# Patient Record
Sex: Female | Born: 1980
Health system: Southern US, Community
[De-identification: ages and names within clinical notes are randomized; demographics above are authoritative.]

---

## 2011-03-31 ENCOUNTER — Other Ambulatory Visit: Payer: Self-pay | Admitting: Nurse Practitioner

## 2011-03-31 ENCOUNTER — Other Ambulatory Visit (HOSPITAL_COMMUNITY)
Admission: RE | Admit: 2011-03-31 | Discharge: 2011-03-31 | Disposition: A | Discharge status: Home / Self Care | Payer: Self-pay | Source: Ambulatory Visit | Attending: Family Medicine | Admitting: Family Medicine

## 2011-03-31 DIAGNOSIS — R87610 Atypical squamous cells of undetermined significance on cytologic smear of cervix (ASC-US): Secondary | ICD-10-CM | POA: Insufficient documentation

## 2011-04-01 ENCOUNTER — Other Ambulatory Visit (HOSPITAL_COMMUNITY)
Admission: RE | Admit: 2011-04-01 | Discharge: 2011-04-01 | Disposition: A | Discharge status: Home / Self Care | Payer: Self-pay | Source: Ambulatory Visit | Attending: Nurse Practitioner | Admitting: Nurse Practitioner

## 2011-04-01 DIAGNOSIS — N870 Mild cervical dysplasia: Secondary | ICD-10-CM | POA: Insufficient documentation

## 2018-09-21 DIAGNOSIS — I639 Cerebral infarction, unspecified: Secondary | ICD-10-CM

## 2018-09-21 HISTORY — DX: Cerebral infarction, unspecified: I63.9

## 2019-08-25 ENCOUNTER — Inpatient Hospital Stay (HOSPITAL_COMMUNITY): Payer: 59 | Admitting: Anesthesiology

## 2019-08-25 ENCOUNTER — Other Ambulatory Visit: Payer: Self-pay

## 2019-08-25 ENCOUNTER — Emergency Department (HOSPITAL_COMMUNITY): Payer: 59

## 2019-08-25 ENCOUNTER — Inpatient Hospital Stay (HOSPITAL_COMMUNITY)
Admission: EM | Disposition: A | Discharge status: Home Health | Payer: Self-pay | Source: Home / Self Care | Attending: Neurology

## 2019-08-25 ENCOUNTER — Inpatient Hospital Stay (HOSPITAL_COMMUNITY): Payer: 59

## 2019-08-25 ENCOUNTER — Inpatient Hospital Stay (HOSPITAL_COMMUNITY)
Admission: EM | Admit: 2019-08-25 | Discharge: 2019-09-05 | DRG: 023 | Disposition: A | Discharge status: Home Health | Payer: 59 | Attending: Neurology | Admitting: Neurology

## 2019-08-25 ENCOUNTER — Encounter (HOSPITAL_COMMUNITY): Payer: Self-pay | Admitting: Certified Registered"

## 2019-08-25 DIAGNOSIS — E669 Obesity, unspecified: Secondary | ICD-10-CM | POA: Diagnosis present

## 2019-08-25 DIAGNOSIS — R29727 NIHSS score 27: Secondary | ICD-10-CM | POA: Diagnosis present

## 2019-08-25 DIAGNOSIS — E1165 Type 2 diabetes mellitus with hyperglycemia: Secondary | ICD-10-CM | POA: Diagnosis present

## 2019-08-25 DIAGNOSIS — I607 Nontraumatic subarachnoid hemorrhage from unspecified intracranial artery: Secondary | ICD-10-CM

## 2019-08-25 DIAGNOSIS — I10 Essential (primary) hypertension: Secondary | ICD-10-CM | POA: Diagnosis present

## 2019-08-25 DIAGNOSIS — R402422 Glasgow coma scale score 9-12, at arrival to emergency department: Secondary | ICD-10-CM | POA: Diagnosis present

## 2019-08-25 DIAGNOSIS — G935 Compression of brain: Secondary | ICD-10-CM | POA: Diagnosis present

## 2019-08-25 DIAGNOSIS — I609 Nontraumatic subarachnoid hemorrhage, unspecified: Secondary | ICD-10-CM | POA: Diagnosis present

## 2019-08-25 DIAGNOSIS — I639 Cerebral infarction, unspecified: Secondary | ICD-10-CM | POA: Diagnosis not present

## 2019-08-25 DIAGNOSIS — I619 Nontraumatic intracerebral hemorrhage, unspecified: Secondary | ICD-10-CM | POA: Diagnosis present

## 2019-08-25 DIAGNOSIS — S06361D Traumatic hemorrhage of cerebrum, unspecified, with loss of consciousness of 30 minutes or less, subsequent encounter: Secondary | ICD-10-CM

## 2019-08-25 DIAGNOSIS — Z20828 Contact with and (suspected) exposure to other viral communicable diseases: Secondary | ICD-10-CM | POA: Diagnosis present

## 2019-08-25 DIAGNOSIS — G911 Obstructive hydrocephalus: Secondary | ICD-10-CM | POA: Diagnosis present

## 2019-08-25 DIAGNOSIS — Z6829 Body mass index (BMI) 29.0-29.9, adult: Secondary | ICD-10-CM | POA: Diagnosis not present

## 2019-08-25 DIAGNOSIS — R001 Bradycardia, unspecified: Secondary | ICD-10-CM | POA: Diagnosis not present

## 2019-08-25 DIAGNOSIS — I493 Ventricular premature depolarization: Secondary | ICD-10-CM | POA: Diagnosis present

## 2019-08-25 DIAGNOSIS — I615 Nontraumatic intracerebral hemorrhage, intraventricular: Secondary | ICD-10-CM

## 2019-08-25 DIAGNOSIS — I676 Nonpyogenic thrombosis of intracranial venous system: Secondary | ICD-10-CM | POA: Diagnosis present

## 2019-08-25 DIAGNOSIS — G8191 Hemiplegia, unspecified affecting right dominant side: Secondary | ICD-10-CM | POA: Diagnosis present

## 2019-08-25 DIAGNOSIS — F1290 Cannabis use, unspecified, uncomplicated: Secondary | ICD-10-CM | POA: Diagnosis present

## 2019-08-25 DIAGNOSIS — I6389 Other cerebral infarction: Secondary | ICD-10-CM | POA: Diagnosis not present

## 2019-08-25 DIAGNOSIS — I616 Nontraumatic intracerebral hemorrhage, multiple localized: Secondary | ICD-10-CM

## 2019-08-25 DIAGNOSIS — G08 Intracranial and intraspinal phlebitis and thrombophlebitis: Secondary | ICD-10-CM | POA: Diagnosis not present

## 2019-08-25 DIAGNOSIS — D696 Thrombocytopenia, unspecified: Secondary | ICD-10-CM | POA: Diagnosis present

## 2019-08-25 DIAGNOSIS — J9601 Acute respiratory failure with hypoxia: Secondary | ICD-10-CM | POA: Diagnosis present

## 2019-08-25 DIAGNOSIS — E876 Hypokalemia: Secondary | ICD-10-CM | POA: Diagnosis present

## 2019-08-25 DIAGNOSIS — E785 Hyperlipidemia, unspecified: Secondary | ICD-10-CM | POA: Diagnosis present

## 2019-08-25 DIAGNOSIS — S06361S Traumatic hemorrhage of cerebrum, unspecified, with loss of consciousness of 30 minutes or less, sequela: Secondary | ICD-10-CM

## 2019-08-25 DIAGNOSIS — R4701 Aphasia: Secondary | ICD-10-CM | POA: Diagnosis present

## 2019-08-25 DIAGNOSIS — I611 Nontraumatic intracerebral hemorrhage in hemisphere, cortical: Secondary | ICD-10-CM | POA: Diagnosis not present

## 2019-08-25 DIAGNOSIS — R1310 Dysphagia, unspecified: Secondary | ICD-10-CM | POA: Diagnosis present

## 2019-08-25 DIAGNOSIS — J9602 Acute respiratory failure with hypercapnia: Secondary | ICD-10-CM | POA: Diagnosis present

## 2019-08-25 DIAGNOSIS — G936 Cerebral edema: Secondary | ICD-10-CM | POA: Diagnosis present

## 2019-08-25 HISTORY — PX: CRANIOTOMY: SHX93

## 2019-08-25 HISTORY — PX: VENTRICULOSTOMY: SHX5377

## 2019-08-25 LAB — CBC WITH DIFFERENTIAL/PLATELET
Abs Immature Granulocytes: 0.02 10*3/uL (ref 0.00–0.07)
Basophils Absolute: 0 10*3/uL (ref 0.0–0.1)
Basophils Relative: 0 %
Eosinophils Absolute: 0.3 10*3/uL (ref 0.0–0.5)
Eosinophils Relative: 3 %
HCT: 38.2 % (ref 36.0–46.0)
Hemoglobin: 12 g/dL (ref 12.0–15.0)
Immature Granulocytes: 0 %
Lymphocytes Relative: 34 %
Lymphs Abs: 3.3 10*3/uL (ref 0.7–4.0)
MCH: 29.6 pg (ref 26.0–34.0)
MCHC: 31.4 g/dL (ref 30.0–36.0)
MCV: 94.1 fL (ref 80.0–100.0)
Monocytes Absolute: 0.6 10*3/uL (ref 0.1–1.0)
Monocytes Relative: 6 %
Neutro Abs: 5.4 10*3/uL (ref 1.7–7.7)
Neutrophils Relative %: 57 %
Platelets: 217 10*3/uL (ref 150–400)
RBC: 4.06 MIL/uL (ref 3.87–5.11)
RDW: 13.2 % (ref 11.5–15.5)
WBC: 9.6 10*3/uL (ref 4.0–10.5)
nRBC: 0 % (ref 0.0–0.2)

## 2019-08-25 LAB — URINALYSIS, ROUTINE W REFLEX MICROSCOPIC
Bilirubin Urine: NEGATIVE
Glucose, UA: NEGATIVE mg/dL
Hgb urine dipstick: NEGATIVE
Ketones, ur: 5 mg/dL — AB
Leukocytes,Ua: NEGATIVE
Nitrite: NEGATIVE
Protein, ur: 30 mg/dL — AB
Specific Gravity, Urine: 1.024 (ref 1.005–1.030)
pH: 5 (ref 5.0–8.0)

## 2019-08-25 LAB — ACETAMINOPHEN LEVEL: Acetaminophen (Tylenol), Serum: <10 ug/mL — ABNORMAL LOW (ref 10–30)

## 2019-08-25 LAB — COMPREHENSIVE METABOLIC PANEL
ALT: 19 U/L (ref 0–44)
AST: 28 U/L (ref 15–41)
Albumin: 3.8 g/dL (ref 3.5–5.0)
Alkaline Phosphatase: 61 U/L (ref 38–126)
Anion gap: 12 (ref 5–15)
BUN: 11 mg/dL (ref 6–20)
CO2: 21 mmol/L — ABNORMAL LOW (ref 22–32)
Calcium: 8.7 mg/dL — ABNORMAL LOW (ref 8.9–10.3)
Chloride: 107 mmol/L (ref 98–111)
Creatinine, Ser: 0.8 mg/dL (ref 0.44–1.00)
GFR calc Af Amer: >60 mL/min (ref >60)
GFR calc non Af Amer: >60 mL/min (ref >60)
Glucose, Bld: 146 mg/dL — ABNORMAL HIGH (ref 70–99)
Potassium: 3.1 mmol/L — ABNORMAL LOW (ref 3.5–5.1)
Sodium: 140 mmol/L (ref 135–145)
Total Bilirubin: 0.8 mg/dL (ref 0.3–1.2)
Total Protein: 6.5 g/dL (ref 6.5–8.1)

## 2019-08-25 LAB — TRIGLYCERIDES: Triglycerides: 153 mg/dL — ABNORMAL HIGH (ref <150)

## 2019-08-25 LAB — LIPID PANEL
Cholesterol: 211 mg/dL — ABNORMAL HIGH (ref 0–200)
HDL: 56 mg/dL (ref >40)
LDL Cholesterol: 124 mg/dL — ABNORMAL HIGH (ref 0–99)
Total CHOL/HDL Ratio: 3.8 RATIO
Triglycerides: 153 mg/dL — ABNORMAL HIGH (ref <150)
VLDL: 31 mg/dL (ref 0–40)

## 2019-08-25 LAB — TYPE AND SCREEN
ABO/RH(D): A POS
Antibody Screen: NEGATIVE

## 2019-08-25 LAB — RAPID URINE DRUG SCREEN, HOSP PERFORMED
Amphetamines: NOT DETECTED
Barbiturates: NOT DETECTED
Benzodiazepines: NOT DETECTED
Cocaine: NOT DETECTED
Opiates: NOT DETECTED
Tetrahydrocannabinol: POSITIVE — AB

## 2019-08-25 LAB — CK: Total CK: 102 U/L (ref 38–234)

## 2019-08-25 LAB — ABO/RH: ABO/RH(D): A POS

## 2019-08-25 LAB — MRSA PCR SCREENING: MRSA by PCR: NEGATIVE

## 2019-08-25 LAB — I-STAT BETA HCG BLOOD, ED (MC, WL, AP ONLY): I-stat hCG, quantitative: <5 m[IU]/mL (ref <5)

## 2019-08-25 LAB — HEMOGLOBIN A1C
Hgb A1c MFr Bld: 4.7 % — ABNORMAL LOW (ref 4.8–5.6)
Mean Plasma Glucose: 88.19 mg/dL

## 2019-08-25 LAB — HIV ANTIBODY (ROUTINE TESTING W REFLEX): HIV Screen 4th Generation wRfx: NONREACTIVE

## 2019-08-25 LAB — SARS CORONAVIRUS 2 BY RT PCR (HOSPITAL ORDER, PERFORMED IN ~~LOC~~ HOSPITAL LAB): SARS Coronavirus 2: NEGATIVE

## 2019-08-25 LAB — ETHANOL: Alcohol, Ethyl (B): <10 mg/dL (ref <10)

## 2019-08-25 LAB — AMMONIA: Ammonia: 43 umol/L — ABNORMAL HIGH (ref 9–35)

## 2019-08-25 LAB — SALICYLATE LEVEL: Salicylate Lvl: <7 mg/dL (ref 2.8–30.0)

## 2019-08-25 SURGERY — CRANIOTOMY HEMATOMA EVACUATION SUBDURAL
Anesthesia: General | Laterality: Right

## 2019-08-25 MED ORDER — PROPOFOL 10 MG/ML IV BOLUS
INTRAVENOUS | Status: AC
  Filled 2019-08-25: qty 20

## 2019-08-25 MED ORDER — ONDANSETRON HCL 4 MG/2ML IJ SOLN
INTRAMUSCULAR | Status: AC
  Filled 2019-08-25: qty 2

## 2019-08-25 MED ORDER — PANTOPRAZOLE SODIUM 40 MG IV SOLR: 40.0000 mg | Freq: Every day | INTRAVENOUS | Status: DC

## 2019-08-25 MED ORDER — FENTANYL CITRATE (PF) 250 MCG/5ML IJ SOLN
INTRAMUSCULAR | Status: AC
  Filled 2019-08-25: qty 5

## 2019-08-25 MED ORDER — PROMETHAZINE HCL 25 MG PO TABS: 12.5000 mg | ORAL_TABLET | ORAL | Status: DC | PRN

## 2019-08-25 MED ORDER — ONDANSETRON HCL 4 MG/2ML IJ SOLN
INTRAMUSCULAR | Status: DC | PRN
  Administered 2019-08-25: 4 mg via INTRAVENOUS

## 2019-08-25 MED ORDER — LIDOCAINE-EPINEPHRINE 0.5 %-1:200000 IJ SOLN
INTRAMUSCULAR | Status: DC | PRN
  Administered 2019-08-25: 6 mL
  Administered 2019-08-25: 10 mL

## 2019-08-25 MED ORDER — CLEVIDIPINE BUTYRATE 0.5 MG/ML IV EMUL
0.0000 mg/h | INTRAVENOUS | Status: DC
  Administered 2019-08-25: 14 mg/h via INTRAVENOUS
  Administered 2019-08-25: 2 mg/h via INTRAVENOUS
  Administered 2019-08-25: 13:00:00 10 mg/h via INTRAVENOUS
  Filled 2019-08-25 (×3): qty 50

## 2019-08-25 MED ORDER — THROMBIN 20000 UNITS EX SOLR
CUTANEOUS | Status: DC | PRN
  Administered 2019-08-25: 09:00:00 via TOPICAL

## 2019-08-25 MED ORDER — STROKE: EARLY STAGES OF RECOVERY BOOK
Freq: Once | Status: DC
  Filled 2019-08-25: qty 1

## 2019-08-25 MED ORDER — ACETAMINOPHEN 325 MG PO TABS
650.0000 mg | ORAL_TABLET | ORAL | Status: DC | PRN
  Administered 2019-08-29 – 2019-09-02 (×4): 650 mg via ORAL
  Filled 2019-08-25 (×4): qty 2

## 2019-08-25 MED ORDER — HYDROCODONE-ACETAMINOPHEN 5-325 MG PO TABS
1.0000 | ORAL_TABLET | ORAL | Status: DC | PRN
  Administered 2019-08-28 – 2019-09-04 (×20): 1 via ORAL
  Filled 2019-08-25 (×22): qty 1

## 2019-08-25 MED ORDER — CHLORHEXIDINE GLUCONATE CLOTH 2 % EX PADS
6.0000 | MEDICATED_PAD | Freq: Every day | CUTANEOUS | Status: DC
  Administered 2019-08-25 – 2019-09-05 (×9): 6 via TOPICAL

## 2019-08-25 MED ORDER — ROCURONIUM BROMIDE 10 MG/ML (PF) SYRINGE
PREFILLED_SYRINGE | INTRAVENOUS | Status: AC
  Filled 2019-08-25: qty 10

## 2019-08-25 MED ORDER — ONDANSETRON HCL 4 MG/2ML IJ SOLN
4.0000 mg | Freq: Once | INTRAMUSCULAR | Status: DC
  Filled 2019-08-25: qty 2

## 2019-08-25 MED ORDER — ACETAMINOPHEN 650 MG RE SUPP
650.0000 mg | RECTAL | Status: DC | PRN
  Filled 2019-08-25: qty 1

## 2019-08-25 MED ORDER — ONDANSETRON HCL 4 MG PO TABS: 4.0000 mg | ORAL_TABLET | ORAL | Status: DC | PRN

## 2019-08-25 MED ORDER — THROMBIN 5000 UNITS EX SOLR
CUTANEOUS | Status: AC
  Filled 2019-08-25: qty 5000

## 2019-08-25 MED ORDER — FENTANYL CITRATE (PF) 100 MCG/2ML IJ SOLN
INTRAMUSCULAR | Status: DC | PRN
  Administered 2019-08-25 (×2): 150 ug via INTRAVENOUS
  Administered 2019-08-25 (×2): 100 ug via INTRAVENOUS

## 2019-08-25 MED ORDER — MANNITOL 25 % IV SOLN
INTRAVENOUS | Status: DC | PRN
  Administered 2019-08-25 (×2): 12.5 g via INTRAVENOUS

## 2019-08-25 MED ORDER — NALOXONE HCL 0.4 MG/ML IJ SOLN: 0.0800 mg | INTRAMUSCULAR | Status: DC | PRN

## 2019-08-25 MED ORDER — PHENYLEPHRINE 40 MCG/ML (10ML) SYRINGE FOR IV PUSH (FOR BLOOD PRESSURE SUPPORT)
PREFILLED_SYRINGE | INTRAVENOUS | Status: DC | PRN
  Administered 2019-08-25: 160 ug via INTRAVENOUS
  Administered 2019-08-25: 80 ug via INTRAVENOUS

## 2019-08-25 MED ORDER — THROMBIN 5000 UNITS EX SOLR
OROMUCOSAL | Status: DC | PRN
  Administered 2019-08-25 (×2): 5 mL via TOPICAL

## 2019-08-25 MED ORDER — ACETAMINOPHEN 160 MG/5ML PO SOLN: 650.0000 mg | ORAL | Status: DC | PRN

## 2019-08-25 MED ORDER — LIDOCAINE-EPINEPHRINE 0.5 %-1:200000 IJ SOLN
INTRAMUSCULAR | Status: AC
  Filled 2019-08-25: qty 1

## 2019-08-25 MED ORDER — PROPOFOL 1000 MG/100ML IV EMUL
5.0000 ug/kg/min | INTRAVENOUS | Status: DC
  Administered 2019-08-25: 11:00:00 5 ug/kg/min via INTRAVENOUS
  Administered 2019-08-25: 25 ug/kg/min via INTRAVENOUS

## 2019-08-25 MED ORDER — CEFAZOLIN SODIUM-DEXTROSE 2-3 GM-%(50ML) IV SOLR
INTRAVENOUS | Status: DC | PRN
  Administered 2019-08-25: 2 g via INTRAVENOUS

## 2019-08-25 MED ORDER — SODIUM CHLORIDE 0.9 % IV SOLN
INTRAVENOUS | Status: DC | PRN
  Administered 2019-08-25: 08:00:00 via INTRAVENOUS

## 2019-08-25 MED ORDER — PROPOFOL 1000 MG/100ML IV EMUL
INTRAVENOUS | Status: AC
  Administered 2019-08-25: 07:00:00 20 ug
  Filled 2019-08-25: qty 100

## 2019-08-25 MED ORDER — THROMBIN 20000 UNITS EX SOLR
CUTANEOUS | Status: AC
  Filled 2019-08-25: qty 20000

## 2019-08-25 MED ORDER — LABETALOL HCL 5 MG/ML IV SOLN: 10.0000 mg | INTRAVENOUS | Status: DC | PRN

## 2019-08-25 MED ORDER — SODIUM CHLORIDE 0.9% IV SOLUTION: Freq: Once | INTRAVENOUS | Status: DC

## 2019-08-25 MED ORDER — LEVETIRACETAM IN NACL 500 MG/100ML IV SOLN
500.0000 mg | Freq: Two times a day (BID) | INTRAVENOUS | Status: DC
  Administered 2019-08-25 – 2019-09-01 (×15): 500 mg via INTRAVENOUS
  Filled 2019-08-25 (×15): qty 100

## 2019-08-25 MED ORDER — ETOMIDATE 2 MG/ML IV SOLN
INTRAVENOUS | Status: AC | PRN
  Administered 2019-08-25: 20 mg via INTRAVENOUS

## 2019-08-25 MED ORDER — PHENYLEPHRINE 40 MCG/ML (10ML) SYRINGE FOR IV PUSH (FOR BLOOD PRESSURE SUPPORT)
PREFILLED_SYRINGE | INTRAVENOUS | Status: AC
  Filled 2019-08-25: qty 10

## 2019-08-25 MED ORDER — ROCURONIUM BROMIDE 50 MG/5ML IV SOLN
INTRAVENOUS | Status: AC | PRN
  Administered 2019-08-25: 80 mg via INTRAVENOUS

## 2019-08-25 MED ORDER — ROCURONIUM BROMIDE 50 MG/5ML IV SOSY
PREFILLED_SYRINGE | INTRAVENOUS | Status: DC | PRN
  Administered 2019-08-25: 20 mg via INTRAVENOUS
  Administered 2019-08-25: 50 mg via INTRAVENOUS
  Administered 2019-08-25: 20 mg via INTRAVENOUS

## 2019-08-25 MED ORDER — SENNOSIDES-DOCUSATE SODIUM 8.6-50 MG PO TABS: 1.0000 | ORAL_TABLET | Freq: Two times a day (BID) | ORAL | Status: DC

## 2019-08-25 MED ORDER — PROPOFOL 10 MG/ML IV BOLUS
INTRAVENOUS | Status: DC | PRN
  Administered 2019-08-25: 80 mg via INTRAVENOUS

## 2019-08-25 MED ORDER — DEXAMETHASONE SODIUM PHOSPHATE 10 MG/ML IJ SOLN
INTRAMUSCULAR | Status: DC | PRN
  Administered 2019-08-25: 10 mg via INTRAVENOUS

## 2019-08-25 MED ORDER — 0.9 % SODIUM CHLORIDE (POUR BTL) OPTIME
TOPICAL | Status: DC | PRN
  Administered 2019-08-25 (×3): 1000 mL

## 2019-08-25 MED ORDER — SODIUM CHLORIDE 0.9 % IV SOLN
INTRAVENOUS | Status: DC | PRN
  Administered 2019-08-25: 200 mL via INTRAVENOUS

## 2019-08-25 MED ORDER — BACITRACIN ZINC 500 UNIT/GM EX OINT
TOPICAL_OINTMENT | CUTANEOUS | Status: AC
  Filled 2019-08-25: qty 28.35

## 2019-08-25 MED ORDER — MORPHINE SULFATE (PF) 2 MG/ML IV SOLN
1.0000 mg | INTRAVENOUS | Status: DC | PRN
  Administered 2019-08-25 – 2019-08-26 (×3): 2 mg via INTRAVENOUS
  Administered 2019-08-27 – 2019-08-31 (×3): 1 mg via INTRAVENOUS
  Filled 2019-08-25 (×6): qty 1

## 2019-08-25 MED ORDER — BACITRACIN ZINC 500 UNIT/GM EX OINT
TOPICAL_OINTMENT | CUTANEOUS | Status: DC | PRN
  Administered 2019-08-25: 1 via TOPICAL

## 2019-08-25 MED ORDER — ONDANSETRON HCL 4 MG/2ML IJ SOLN
4.0000 mg | INTRAMUSCULAR | Status: DC | PRN
  Administered 2019-08-25 – 2019-08-31 (×6): 4 mg via INTRAVENOUS
  Filled 2019-08-25 (×5): qty 2

## 2019-08-25 MED ORDER — HEMOSTATIC AGENTS (NO CHARGE) OPTIME
TOPICAL | Status: DC | PRN
  Administered 2019-08-25: 1 via TOPICAL

## 2019-08-25 MED ORDER — IOHEXOL 350 MG/ML SOLN
75.0000 mL | Freq: Once | INTRAVENOUS | Status: AC | PRN
  Administered 2019-08-25: 08:00:00 75 mL via INTRAVENOUS

## 2019-08-25 MED ORDER — CEFAZOLIN SODIUM-DEXTROSE 1-4 GM/50ML-% IV SOLN
1.0000 g | Freq: Three times a day (TID) | INTRAVENOUS | Status: DC
  Administered 2019-08-25 – 2019-09-01 (×22): 1 g via INTRAVENOUS
  Filled 2019-08-25 (×24): qty 50

## 2019-08-25 MED ORDER — DEXAMETHASONE SODIUM PHOSPHATE 10 MG/ML IJ SOLN
INTRAMUSCULAR | Status: AC
  Filled 2019-08-25: qty 1

## 2019-08-25 SURGICAL SUPPLY — 105 items
BAND RUBBER #18 3X1/16 STRL (MISCELLANEOUS) IMPLANT
BENZOIN TINCTURE PRP APPL 2/3 (GAUZE/BANDAGES/DRESSINGS) IMPLANT
BLADE CLIPPER SURG (BLADE) ×3 IMPLANT
BLADE SURG 15 STRL LF DISP TIS (BLADE) ×2 IMPLANT
BLADE SURG 15 STRL SS (BLADE) ×1
BLADE ULTRA TIP 2M (BLADE) IMPLANT
BNDG GAUZE ELAST 4 BULKY (GAUZE/BANDAGES/DRESSINGS) ×3 IMPLANT
BNDG STRETCH 4X75 NS LF (GAUZE/BANDAGES/DRESSINGS) ×6 IMPLANT
BUR ACORN 6.0 PRECISION (BURR) ×3 IMPLANT
BUR MATCHSTICK NEURO 3.0 LAGG (BURR) ×3 IMPLANT
BUR SPIRAL ROUTER 2.3 (BUR) ×3 IMPLANT
CABLE BIPOLOR RESECTION CORD (MISCELLANEOUS) ×3 IMPLANT
CANISTER SUCT 3000ML PPV (MISCELLANEOUS) ×6 IMPLANT
CARTRIDGE OIL MAESTRO DRILL (MISCELLANEOUS) ×2 IMPLANT
CATH VENTRIC 35X38 W/TROCAR LG (CATHETERS) ×3 IMPLANT
CLIP VESOCCLUDE MED 6/CT (CLIP) IMPLANT
COVER WAND RF STERILE (DRAPES) ×6 IMPLANT
DECANTER SPIKE VIAL GLASS SM (MISCELLANEOUS) ×3 IMPLANT
DIFFUSER DRILL AIR PNEUMATIC (MISCELLANEOUS) ×6 IMPLANT
DRAPE HALF SHEET 40X57 (DRAPES) ×3 IMPLANT
DRAPE MICROSCOPE LEICA (MISCELLANEOUS) IMPLANT
DRAPE NEUROLOGICAL W/INCISE (DRAPES) ×3 IMPLANT
DRAPE POUCH INSTRU U-SHP 10X18 (DRAPES) IMPLANT
DRAPE SURG 17X23 STRL (DRAPES) IMPLANT
DRAPE WARM FLUID 44X44 (DRAPES) ×3 IMPLANT
DRSG OPSITE 4X5.5 SM (GAUZE/BANDAGES/DRESSINGS) ×3 IMPLANT
DURAPREP 6ML APPLICATOR 50/CS (WOUND CARE) ×3 IMPLANT
ELECT CAUTERY BLADE 6.4 (BLADE) IMPLANT
ELECT REM PT RETURN 9FT ADLT (ELECTROSURGICAL) ×6
ELECTRODE REM PT RTRN 9FT ADLT (ELECTROSURGICAL) ×4 IMPLANT
EVACUATOR 1/8 PVC DRAIN (DRAIN) IMPLANT
EVACUATOR SILICONE 100CC (DRAIN) IMPLANT
GAUZE 4X4 16PLY RFD (DISPOSABLE) ×3 IMPLANT
GAUZE SPONGE 4X4 12PLY STRL (GAUZE/BANDAGES/DRESSINGS) ×6 IMPLANT
GLOVE BIO SURGEON STRL SZ 6.5 (GLOVE) IMPLANT
GLOVE BIO SURGEON STRL SZ7 (GLOVE) IMPLANT
GLOVE BIO SURGEON STRL SZ7.5 (GLOVE) IMPLANT
GLOVE BIO SURGEON STRL SZ8 (GLOVE) IMPLANT
GLOVE BIO SURGEON STRL SZ8.5 (GLOVE) IMPLANT
GLOVE BIOGEL M 8.0 STRL (GLOVE) IMPLANT
GLOVE ECLIPSE 6.5 STRL STRAW (GLOVE) ×6 IMPLANT
GLOVE ECLIPSE 7.0 STRL STRAW (GLOVE) IMPLANT
GLOVE ECLIPSE 7.5 STRL STRAW (GLOVE) IMPLANT
GLOVE ECLIPSE 8.0 STRL XLNG CF (GLOVE) IMPLANT
GLOVE EXAM NITRILE XL STR (GLOVE) IMPLANT
GLOVE INDICATOR 6.5 STRL GRN (GLOVE) ×3 IMPLANT
GLOVE INDICATOR 7.0 STRL GRN (GLOVE) IMPLANT
GLOVE INDICATOR 7.5 STRL GRN (GLOVE) IMPLANT
GLOVE INDICATOR 8.0 STRL GRN (GLOVE) IMPLANT
GLOVE INDICATOR 8.5 STRL (GLOVE) IMPLANT
GLOVE OPTIFIT SS 8.0 STRL (GLOVE) IMPLANT
GLOVE SURG SS PI 6.5 STRL IVOR (GLOVE) IMPLANT
GOWN STRL REUS W/ TWL LRG LVL3 (GOWN DISPOSABLE) ×6 IMPLANT
GOWN STRL REUS W/ TWL XL LVL3 (GOWN DISPOSABLE) IMPLANT
GOWN STRL REUS W/TWL 2XL LVL3 (GOWN DISPOSABLE) IMPLANT
GOWN STRL REUS W/TWL LRG LVL3 (GOWN DISPOSABLE) ×3
GOWN STRL REUS W/TWL XL LVL3 (GOWN DISPOSABLE)
HEMOSTAT POWDER SURGIFOAM 1G (HEMOSTASIS) ×6 IMPLANT
HEMOSTAT SURGICEL 2X14 (HEMOSTASIS) ×3 IMPLANT
HOOK DURA 1/2IN (MISCELLANEOUS) ×3 IMPLANT
KIT BASIN OR (CUSTOM PROCEDURE TRAY) ×3 IMPLANT
KIT CRANIAL ACCESS (MISCELLANEOUS) ×1
KIT CRANIAL ACCESS 5/1X25G (MISCELLANEOUS) ×2 IMPLANT
KIT DRAIN CSF ACCUDRAIN (MISCELLANEOUS) ×3 IMPLANT
KIT TURNOVER KIT B (KITS) ×6 IMPLANT
NEEDLE HYPO 18GX1.5 BLUNT FILL (NEEDLE) IMPLANT
NEEDLE HYPO 25X1 1.5 SAFETY (NEEDLE) ×3 IMPLANT
NEEDLE SPNL 18GX3.5 QUINCKE PK (NEEDLE) ×3 IMPLANT
NS IRRIG 1000ML POUR BTL (IV SOLUTION) ×9 IMPLANT
OIL CARTRIDGE MAESTRO DRILL (MISCELLANEOUS) ×3
PACK CRANIOTOMY CUSTOM (CUSTOM PROCEDURE TRAY) ×3 IMPLANT
PACK EENT II TURBAN DRAPE (CUSTOM PROCEDURE TRAY) ×3 IMPLANT
PAD ARMBOARD 7.5X6 YLW CONV (MISCELLANEOUS) IMPLANT
PATTIES SURGICAL .5 X.5 (GAUZE/BANDAGES/DRESSINGS) IMPLANT
PATTIES SURGICAL .5 X3 (DISPOSABLE) IMPLANT
PATTIES SURGICAL 1X1 (DISPOSABLE) ×3 IMPLANT
PENCIL BUTTON HOLSTER BLD 10FT (ELECTRODE) IMPLANT
PIN MAYFIELD SKULL DISP (PIN) ×3 IMPLANT
PLATE 1.5  2HOLE MED NEURO (Plate) ×1 IMPLANT
PLATE 1.5 2HOLE MED NEURO (Plate) ×2 IMPLANT
PLATE 1.5/0.5 13MM BURR HOLE (Plate) ×9 IMPLANT
SCREW SELF DRILL HT 1.5/4MM (Screw) ×33 IMPLANT
SET POST CRANIOTOMY SUBDURAL (MISCELLANEOUS) IMPLANT
SPONGE NEURO XRAY DETECT 1X3 (DISPOSABLE) IMPLANT
SPONGE SURGIFOAM ABS GEL 100 (HEMOSTASIS) ×3 IMPLANT
STAPLER VISISTAT 35W (STAPLE) ×6 IMPLANT
SUT ETHILON 3 0 FSL (SUTURE) IMPLANT
SUT ETHILON 3 0 PS 1 (SUTURE) ×3 IMPLANT
SUT NURALON 4 0 TR CR/8 (SUTURE) ×6 IMPLANT
SUT SILK 2 0 PERMA HAND 18 BK (SUTURE) ×3 IMPLANT
SUT STEEL 0 (SUTURE)
SUT STEEL 0 18XMFL TIE 17 (SUTURE) IMPLANT
SUT VIC AB 0 CT1 18XCR BRD8 (SUTURE) ×2 IMPLANT
SUT VIC AB 0 CT1 8-18 (SUTURE) ×1
SUT VIC AB 2-0 CT2 18 VCP726D (SUTURE) ×9 IMPLANT
SUT VIC AB 3-0 SH 8-18 (SUTURE) ×3 IMPLANT
SYR BULB 3OZ (MISCELLANEOUS) ×3 IMPLANT
SYR CONTROL 10ML LL (SYRINGE) ×3 IMPLANT
TOWEL GREEN STERILE (TOWEL DISPOSABLE) ×6 IMPLANT
TOWEL GREEN STERILE FF (TOWEL DISPOSABLE) ×6 IMPLANT
TRAY FOLEY MTR SLVR 16FR STAT (SET/KITS/TRAYS/PACK) ×3 IMPLANT
TUBE CONNECTING 12X1/4 (SUCTIONS) ×3 IMPLANT
TUBE CONNECTING 20X1/4 (TUBING) ×3 IMPLANT
UNDERPAD 30X30 (UNDERPADS AND DIAPERS) ×3 IMPLANT
WATER STERILE IRR 1000ML POUR (IV SOLUTION) ×6 IMPLANT

## 2019-08-25 NOTE — ED Provider Notes (Signed)
[facility] EMERGENCY DEPARTMENT Provider Note   CSN: [id number] Arrival date & time: 08/25/19  3716     History   Chief Complaint Chief Complaint  Patient presents with  . Altered Mental Status   Level 5 caveat due to altered mental status HPI Amanda Small is a 38 y.o. female.     The history is provided by the EMS personnel. The history is limited by the condition of the patient.  Altered Mental Status Presenting symptoms: behavior changes and combativeness   Severity:  Severe Most recent episode:  Today Episode history:  Single Timing:  Constant Progression:  Worsening Chronicity:  New  Patient presents for altered mental status. EMS reports they were called due to arm and leg pain.  Apparently patient got out of shower and was acting erratically.  EMS found her with minimal clothing and combative.  Initially this was a presumed overdose No signs of trauma No other details known on arrival PMH=unknown Soc hx - unknown OB History   No obstetric history on file.      Home Medications    Prior to Admission medications   Not on File    Family History No family history on file.  Social History Social History   Tobacco Use  . Smoking status: Not on file  Substance Use Topics  . Alcohol use: Not on file  . Drug use: Not on file     Allergies   Patient has no known allergies.   Review of Systems Review of Systems  Unable to perform ROS: Mental status change     Physical Exam Updated Vital Signs BP (!) 107/59   Pulse 63   Temp (!) 95.6 F (35.3 C) (Rectal)   Resp (!) 21   SpO2 100%   Physical Exam  CONSTITUTIONAL: Disheveled and altered HEAD: Normocephalic/atraumatic, no signs of trauma EYES: EOMI/PERRL, no nystagmus ENMT: Mucous membranes moist, vomit noted about mouth NECK: supple no meningeal signs CV: S1/S2 noted, no murmurs/rubs/gallops noted LUNGS: Lungs are clear to auscultation bilaterally, no apparent  distress ABDOMEN: soft, nontender NEURO: Pt is somnolent but intermittently combative.  Moves all extremities x4.  GCS 11 EXTREMITIES: pulses normal/equal, full ROM, no signs of trauma SKIN: Cool to touch PSYCH: Unable to assess ED Treatments / Results  Labs (all labs ordered are listed, but only abnormal results are displayed) Labs Reviewed  COMPREHENSIVE METABOLIC PANEL - Abnormal; Notable for the following components:      Result Value   Potassium 3.1 (*)    CO2 21 (*)    Glucose, Bld 146 (*)    Calcium 8.7 (*)    All other components within normal limits  ACETAMINOPHEN LEVEL - Abnormal; Notable for the following components:   Acetaminophen (Tylenol), Serum <10 (*)    All other components within normal limits  AMMONIA - Abnormal; Notable for the following components:   Ammonia 43 (*)    All other components within normal limits  RAPID URINE DRUG SCREEN, HOSP PERFORMED - Abnormal; Notable for the following components:   Tetrahydrocannabinol POSITIVE (*)    All other components within normal limits  SARS CORONAVIRUS 2 BY RT PCR (HOSPITAL ORDER, PERFORMED IN [place] [facility])  URINE CULTURE  CBC WITH DIFFERENTIAL/PLATELET  ETHANOL  SALICYLATE LEVEL  CK  PROTIME-INR  APTT  URINALYSIS, ROUTINE W REFLEX MICROSCOPIC  TRIGLYCERIDES  I-STAT BETA HCG BLOOD, ED ([facility], [facility], [facility] ONLY)  I-STAT BETA HCG BLOOD, ED ([facility], [facility], [facility] ONLY)  EKG EKG Interpretation  Date/Time:  Friday August 25 2019 06:03:32 EST Ventricular Rate:  60 PR Interval:    QRS Duration: 90 QT Interval:  481 QTC Calculation: 481 R Axis:   65 Text Interpretation: Sinus rhythm Borderline prolonged PR interval No significant change since last tracing Confirmed by Zadie RhineWickline, Delio Slates ([id number]) on 08/25/2019 6:27:40 AM   Radiology Ct Head Wo Contrast  Result Date: 08/25/2019 CLINICAL DATA:  Altered level of consciousness. EXAM: CT HEAD WITHOUT CONTRAST TECHNIQUE: Contiguous axial images were obtained from the  base of the skull through the vertex without intravenous contrast. COMPARISON:  None. FINDINGS: Brain: A large left temporal and parietal parenchymal hematoma measures 7.8 x 3.3 x 3.0 cm. This extends into the left lateral ventricle. The left lateral ventricle is filled with blood. There is diffuse mass effect in the left hemisphere. Midline shift measures 8 mm. The right lateral ventricle is within normal limits. Cerebellar tonsils are at the level of the foramen magnum without herniation. There is some surrounding vasogenic edema associated with the hemorrhage. Vascular: No hyperdense vessel or unexpected calcification. Skull: Calvarium is intact. No focal lytic or blastic lesions are present. No significant extracranial soft tissue lesion is present. Sinuses/Orbits: The paranasal sinuses and mastoid air cells are clear. The globes and orbits are within normal limits. IMPRESSION: 1. Large left temporal and parietal hematoma measures 7.8 x 3.3 x 3.0 cm. 2. Intraventricular extension of hemorrhage. 3. Diffuse left hemispheric mass effect with midline shift measuring 8 mm. Critical Value/emergent results were called by telephone at the time of interpretation on 08/25/2019 at 6:36 am to provider[facility]ALD [facility] , who verbally acknowledged these results. Electronically Signed   By: Marin Robertshristopher  Mattern M.D.   On: 08/25/2019 06:38   Dg Chest Port 1 View  Result Date: 08/25/2019 CLINICAL DATA:  Altered mental status.  Status post intubation. EXAM: PORTABLE CHEST 1 VIEW COMPARISON:  Single-view of the chest earlier today. FINDINGS: Endotracheal tube is in place with the tip in good position approximately 3 cm above the carina. OG tube courses into the stomach and below the inferior margin of the film. Lungs are clear. Heart size is normal. No pneumothorax or pleural fluid. IMPRESSION: ETT in good position. OG tube courses into the stomach and below the film. No acute disease. Electronically Signed   By: Drusilla Kannerhomas  Dalessio  M.D.   On: 08/25/2019 07:33   Dg Chest Port 1 View  Result Date: 08/25/2019 CLINICAL DATA:  Altered mental status EXAM: PORTABLE CHEST 1 VIEW COMPARISON:  None. FINDINGS: The cardiac silhouette, mediastinal and hilar contours are within normal limits given the AP projection. Low lung volumes with streaky bibasilar atelectasis but no infiltrates, edema or effusions. The bony thorax is intact. IMPRESSION: No acute cardiopulmonary findings. Electronically Signed   By: Rudie MeyerP.  Gallerani M.D.   On: 08/25/2019 06:22    Procedures Procedure Name: Intubation Date/Time: 08/25/2019 6:50 AM Performed by: Zadie RhineWickline, Babyboy Loya, MD Pre-anesthesia Checklist: Emergency Drugs available, Suction available, Patient identified and Patient being monitored Oxygen Delivery Method: Non-rebreather mask Preoxygenation: Pre-oxygenation with 100% oxygen Induction Type: Rapid sequence Laryngoscope Size: Glidescope Grade View: Grade I Tube size: 7.0 mm Number of attempts: 1 Airway Equipment and Method: Video-laryngoscopy Placement Confirmation: ETT inserted through vocal cords under direct vision,  CO2 detector and Breath sounds checked- equal and bilateral Secured at: 22 cm Tube secured with: ETT holder    .Critical Care Performed by: Zadie RhineWickline, Moira Umholtz, MD Authorized by: Zadie RhineWickline, Obdulia Steier, MD   Critical care provider statement:  Critical care time (minutes):  35   Critical care start time:  08/25/2019 6:05 AM   Critical care end time:  08/25/2019 6:40 AM   Critical care time was exclusive of:  Separately billable procedures and treating other patients   Critical care was necessary to treat or prevent imminent or life-threatening deterioration of the following conditions:  CNS failure or compromise   Critical care was time spent personally by me on the following activities:  Ordering and review of radiographic studies, ordering and review of laboratory studies, pulse oximetry, re-evaluation of patient's condition,  ordering and performing treatments and interventions, discussions with consultants, evaluation of patient's response to treatment, examination of patient and development of treatment plan with patient or surrogate   I assumed direction of critical care for this patient from another provider in my specialty: no      Medications Ordered in ED Medications  ondansetron (ZOFRAN) injection 4 mg (has no administration in time range)  clevidipine (CLEVIPREX) infusion 0.5 mg/mL (has no administration in time range)  propofol (DIPRIVAN) 1000 MG/100ML infusion (has no administration in time range)  propofol (DIPRIVAN) 1000 MG/100ML infusion (20 mcg  New Bag/Given 08/25/19 0706)  etomidate (AMIDATE) injection (20 mg Intravenous Given 08/25/19 0648)  rocuronium (ZEMURON) injection (80 mg Intravenous Given 08/25/19 0649)     Initial Impression / Assessment and Plan / ED Course  I have reviewed the triage vital signs and the nursing notes.  Pertinent labs & imaging results that were available during my care of the patient were reviewed by me and considered in my medical decision making (see chart for details).       7:19 AM  Seen after arrival for altered mental status due to presumed overdose.  EMS reports she may have used marijuana. Due to altered mental status, stat CT head was performed.  CT head shows large intraparenchymal hemorrhage with intraventricular extension On initial reassessed after CT, patient was following some commands GCS remained steady at 11 However several minutes later patient became extremely combative with intermittent somnolence. Patient was intubated without difficulty.  Discussed the case with Dr. Christella Noa with neurosurgery, he requests neuro consultation and CT angio head.  He will see the patient Discussed with neurology Dr. Rory Percy He requests activating code stroke to ensure all stroke team is available for this patient.  He request blood pressure control. Patient is on  propofol for sedation and should help blood pressure  I discussed the case extensively with patient's Sister Aldona Bar. 7:39 AM Patient has been seen by neurology Dr. Rory Percy, Dr. Christella Noa is here from neurosurgery to take patient to the operating room Patient is having some episodes of bigeminy but no other acute dysrhythmias.  Potassium 3.1.  Patient is in CT imaging at this time and will likely go to the operating room Final Clinical Impressions(s) / ED Diagnoses   Final diagnoses:  Left-sided nontraumatic intracerebral hemorrhage, unspecified cerebral location [facility])  IVH (intraventricular hemorrhage) [facility])    ED Discharge Orders    None       Ripley Fraise, MD 08/25/19 0740

## 2019-08-25 NOTE — Progress Notes (Signed)
Patient arrived to the ICU being bagged by CRNA. RT connected patient to the ventilator with her previous settings. Patient is tolerating well and vitals are stable.

## 2019-08-25 NOTE — Procedures (Signed)
Extubation Procedure Note  Patient Details:   Name: Amanda Small DOB: [birth year] MRN: [id number]   Airway Documentation:    Vent end date: 08/25/19 Vent end time: 1156   Evaluation  O2 sats: stable throughout Complications: No apparent complications Patient did tolerate procedure well. Bilateral Breath Sounds: Clear, Diminished   Yes   Patient extubated per verbal MD order. MD at bedside and did the extubation. Patient left on room air. Vitals are stable. RN at bedside.   [name] 08/25/2019, 12:00 PM

## 2019-08-25 NOTE — ED Notes (Signed)
Aldona Bar, pt sister, [phone], came by, updated on visitor policy, please call with updates or when pt can have visitor.

## 2019-08-25 NOTE — Op Note (Signed)
08/25/2019  11:23 AM  PATIENT:  Amanda Small  38 y.o. female presented this morning with an acute ICH left temporal lobe with intraventricular extension. She was taken to the operating room for an emergent craniotomy for hematoma evacuation  PRE-OPERATIVE DIAGNOSIS:  Clot  POST-OPERATIVE DIAGNOSIS:  Clot  PROCEDURE:  Procedure(s): LEFT temporal craniotomy for hematoma evacuation RIGHT FRONTAL VENTRICULAR CATHETER placement via twist drill burr hole  SURGEON: Surgeon(s): [name], MD Newman Pies, MD  ASSISTANTS:Jenkins, Dellis Filbert  ANESTHESIA:   general  EBL:  Total I/O In: 1300 [I.V.:1300] Out: 1400 [Urine:1300; Blood:100]  BLOOD ADMINISTERED:none  CELL SAVER GIVEN:none  COUNT:per nursing  DRAINS: right frontal invtraventricular catheter   SPECIMEN:  Source of Specimen:  blood clot  DICTATION: Amanda Small was taken to the operating room and placed under general anesthesia without difficulty. OR staff placed a foley catheter under sterile conditions.. I shaved and placed her head in a three pin Mayfield head holder. I attached the Mayfield to the OR bed adapter tuning her head towards the left bringing the temporal bone paralell to the floor. Once positioned her head was prepped and draped in a sterile manner.I planned a reverse question mark incision confining the medial arm to the temporal region. I infiltrated the planned incision line with lidocaine. I opened the incision with a 10 blade. I dissected the STA and protected it by placing a Raney clip around it. I developed a plane and reflected the scalp flap rostrally leaving the Temporalis muscle in place. With the monopolar cautery I both incised and reflected the Temporalis muscle rostrally and retracted it with the scalp with fish hooks. I left a cuff of the muscle in order to close. I created three burr holes with the drill, then with the router connected the holes and removed a bone flap exposing the  temporal dura. I opened the dura with a 15 blade and Metzenbaum scissors. I chose a temporal lobe gyrus, cauterized a linear path in the gyrus and using suction and cautery entered the cerebral substance and found the hematoma.  With suction I evacuated the hematoma, interspersed with copious irrigation. I removed enough of the hematoma to relax the brain which was quite tense upon opening. With time I achieved hemostasis in the cavity. I placed surgicel on the brain surface. I cauterized the pia surrounding the hematoma cavity.  I closed the dura with sutures, neurolon. I placed dural tack up sutures around the edges of the skull opening then replaced the skull flap with plates and screws. I closed the scalp in layers approximating the galea with suture, and the scalp edges with staples.  I removed the drapes, and took him out of the Mayfield head holder. I placed his head in a horseshoe head rest looking directly up. I prepped the right side of his head. I planned my incision for the ventricular catheter placement, and infiltrated it with lidocaine. I draped the head and started the procedure. I opened the incision with a 15 blade. I used a hand drill to create a twist drill burr hole in the midpupillary line on the right. I opened the dura with a spinal needle. I placed the ventricular catheter using customary trajectory and landmarks. Upon first pass the catheter was in the ventricle draining fairly clear spinal fluid fluctuating with respirations at a fairly low pressure. I tunneled the catheter under the scalp bringing it out a few centimeters from the insertion site. I approximated the incision with staples. I  secured the catheter to the scalp with a suture. I placed a sterile dressing on the ventricular catheter and incision. I wrapped the head with a sterile dressing. Ms. Ocampo was taken directly to the ICU intubated and in stable condition.  PLAN OF CARE: Admit to inpatient   PATIENT DISPOSITION:   ICU - intubated and critically ill.   Delay start of Pharmacological VTE agent (>24hrs) due to surgical blood loss or risk of bleeding:  yes

## 2019-08-25 NOTE — Transfer of Care (Signed)
Immediate Anesthesia Transfer of Care Note  Patient: Amanda Small  Procedure(s) Performed: LEFT CRANIECTOMY (Left ) RIGHT FRONTAL VENTRICULAR CATHETER (Right )  Patient Location: ICU  Anesthesia Type:General  Level of Consciousness: Patient remains intubated per anesthesia plan  Airway & Oxygen Therapy: Patient remains intubated per anesthesia plan and Patient placed on Ventilator (see vital sign flow sheet for setting)  Post-op Assessment: Report given to RN and Post -op Vital signs reviewed and stable  Post vital signs: Reviewed and stable  Last Vitals:  Vitals Value Taken Time  BP 95/48 08/25/19 1050  Temp    Pulse 93 08/25/19 1053  Resp 14 08/25/19 1053  SpO2 100 % 08/25/19 1053  Vitals shown include unvalidated device data.  Last Pain:  Vitals:   08/25/19 0605  TempSrc: Rectal         Complications: No apparent anesthesia complications

## 2019-08-25 NOTE — Anesthesia Postprocedure Evaluation (Signed)
Anesthesia Post Note  Patient: Amanda Small  Procedure(s) Performed: LEFT CRANIECTOMY (Left ) RIGHT FRONTAL VENTRICULAR CATHETER (Right )     Patient location during evaluation: SICU Anesthesia Type: General Level of consciousness: awake Pain management: pain level controlled Vital Signs Assessment: post-procedure vital signs reviewed and stable Respiratory status: spontaneous breathing, nonlabored ventilation, respiratory function stable and patient connected to nasal cannula oxygen Cardiovascular status: blood pressure returned to baseline and stable Postop Assessment: no apparent nausea or vomiting Anesthetic complications: no    Last Vitals:  Vitals:   08/25/19 1500 08/25/19 1600  BP: (!) 101/58 109/70  Pulse: 81 (!) 58  Resp: 20 (!) 21  Temp: 36.6 C   SpO2: 97% 99%    Last Pain:  Vitals:   08/25/19 0605  TempSrc: Rectal    LLE Motor Response: Purposeful movement (08/25/19 1600)   RLE Motor Response: Purposeful movement (08/25/19 1600)        Ryan P Ellender

## 2019-08-25 NOTE — ED Notes (Signed)
Patient has vaginal bleeding (appears to be having her menstrual)

## 2019-08-25 NOTE — H&P (Signed)
Amanda Small is an 38 y.o. female.   Chief Complaint: left temporal ich, IVH HPI: whom was noted by ems to have leg and arm pain. Upon arrival to [facility] ED had multiple episodes of emesis, noted to be combative, with altered and diminished mental status. Intubated in the ED. Head CT shows large ICH left temporal lobe with IVH, CTA did not show upon cursory review an aneurysm nor AVM.  History reviewed. No pertinent past medical history.  History reviewed. No pertinent surgical history.  History reviewed. No pertinent family history. Social History:  has no history on file for tobacco, alcohol, and drug.  Allergies: No Known Allergies  (Not in a hospital admission)   Results for orders placed or performed during the hospital encounter of 08/25/19 (from the past 48 hour(s))  CBC with Differential     Status: None   Collection Time: 08/25/19  6:08 AM  Result Value Ref Range   WBC 9.6 4.0 - 10.5 K/uL   RBC 4.06 3.87 - 5.11 MIL/uL   Hemoglobin 12.0 12.0 - 15.0 g/dL   HCT 47.838.2 29.536.0 - 62.146.0 %   MCV 94.1 80.0 - 100.0 fL   MCH 29.6 26.0 - 34.0 pg   MCHC 31.4 30.0 - 36.0 g/dL   RDW 30.813.2 65.711.5 - 84.615.5 %   Platelets 217 150 - 400 K/uL   nRBC 0.0 0.0 - 0.2 %   Neutrophils Relative % 57 %   Neutro Abs 5.4 1.7 - 7.7 K/uL   Lymphocytes Relative 34 %   Lymphs Abs 3.3 0.7 - 4.0 K/uL   Monocytes Relative 6 %   Monocytes Absolute 0.6 0.1 - 1.0 K/uL   Eosinophils Relative 3 %   Eosinophils Absolute 0.3 0.0 - 0.5 K/uL   Basophils Relative 0 %   Basophils Absolute 0.0 0.0 - 0.1 K/uL   Immature Granulocytes 0 %   Abs Immature Granulocytes 0.02 0.00 - 0.07 K/uL    Comment: Performed at [facility], [address] [place], [place] [address]  Comprehensive metabolic panel     Status: Abnormal   Collection Time: 08/25/19  6:08 AM  Result Value Ref Range   Sodium 140 135 - 145 mmol/L   Potassium 3.1 (L) 3.5 - 5.1 mmol/L   Chloride 107 98 - 111 mmol/L   CO2 21 (L) 22 - 32 mmol/L    Glucose, Bld 146 (H) 70 - 99 mg/dL   BUN 11 6 - 20 mg/dL   Creatinine, Ser 2.840.80 0.44 - 1.00 mg/dL   Calcium 8.7 (L) 8.9 - 10.3 mg/dL   Total Protein 6.5 6.5 - 8.1 g/dL   Albumin 3.8 3.5 - 5.0 g/dL   AST 28 15 - 41 U/L   ALT 19 0 - 44 U/L   Alkaline Phosphatase 61 38 - 126 U/L   Total Bilirubin 0.8 0.3 - 1.2 mg/dL   GFR calc non Af Amer >60 >60 mL/min   GFR calc Af Amer >60 >60 mL/min   Anion gap 12 5 - 15    Comment: Performed at [facility], [address] [place], [place] [address]  Acetaminophen level     Status: Abnormal   Collection Time: 08/25/19  6:08 AM  Result Value Ref Range   Acetaminophen (Tylenol), Serum <10 (L) 10 - 30 ug/mL    Comment: (NOTE) Therapeutic concentrations vary significantly. A range of 10-30 ug/mL  may be an effective concentration for many patients. However, some  are best  treated at concentrations outside of this range. Acetaminophen concentrations >150 ug/mL at 4 hours after ingestion  and >50 ug/mL at 12 hours after ingestion are often associated with  toxic reactions. Performed at [facility], [address], [place], [place] [address]   Ammonia     Status: Abnormal   Collection Time: 08/25/19  6:08 AM  Result Value Ref Range   Ammonia 43 (H) 9 - 35 umol/L    Comment: Performed at [facility], [address], [place], [place] [address]  Ethanol     Status: None   Collection Time: 08/25/19  6:08 AM  Result Value Ref Range   Alcohol, Ethyl (B) <10 <10 mg/dL    Comment: (NOTE) Lowest detectable limit for serum alcohol is 10 mg/dL. For medical purposes only. Performed at [facility], [address] [place], [place] [address]   Salicylate level     Status: None   Collection Time: 08/25/19  6:08 AM  Result Value Ref Range   Salicylate Lvl <7.0 2.8 - 30.0 mg/dL    Comment: Performed at [facility], [address], [place], [place] [address]  CK     Status: None   Collection Time: 08/25/19  6:08 AM   Result Value Ref Range   Total CK 102 38 - 234 U/L    Comment: Performed at [facility], [address] [place], [place] [address]  Urine rapid drug screen (hosp performed)not at [facility]     Status: Abnormal   Collection Time: 08/25/19  6:13 AM  Result Value Ref Range   Opiates NONE DETECTED NONE DETECTED   Cocaine NONE DETECTED NONE DETECTED   Benzodiazepines NONE DETECTED NONE DETECTED   Amphetamines NONE DETECTED NONE DETECTED   Tetrahydrocannabinol POSITIVE (A) NONE DETECTED   Barbiturates NONE DETECTED NONE DETECTED    Comment: (NOTE) DRUG SCREEN FOR MEDICAL PURPOSES ONLY.  IF CONFIRMATION IS NEEDED FOR ANY PURPOSE, NOTIFY LAB WITHIN 5 DAYS. LOWEST DETECTABLE LIMITS FOR URINE DRUG SCREEN Drug Class                     Cutoff (ng/mL) Amphetamine and metabolites    1000 Barbiturate and metabolites    200 Benzodiazepine                 200 Tricyclics and metabolites     300 Opiates and metabolites        300 Cocaine and metabolites        300 THC                            50 Performed at [facility], [address], [place], [place] [address]   I-Stat Beta hCG blood, ED ([facility], [facility], [facility] only)     Status: None   Collection Time: 08/25/19  6:15 AM  Result Value Ref Range   I-stat hCG, quantitative <5.0 <5 mIU/mL   Comment 3            Comment:   GEST. AGE      CONC.  (mIU/mL)   <=1 WEEK        5 - 50     2 WEEKS       50 - 500     3 WEEKS       100 - 10,000     4 WEEKS     1,000 -  30,000        FEMALE AND NON-PREGNANT FEMALE:     LESS THAN 5 mIU/mL   SARS Coronavirus 2 by RT PCR (hospital order, performed in [facility] hospital lab) Nasopharyngeal Nasopharyngeal Swab     Status: None   Collection Time: 08/25/19  6:40 AM   Specimen: Nasopharyngeal Swab  Result Value Ref Range   SARS Coronavirus 2 NEGATIVE NEGATIVE    Comment: (NOTE) SARS-CoV-2 target nucleic acids are NOT DETECTED. The SARS-CoV-2 RNA is generally detectable in upper and lower respiratory  specimens during the acute phase of infection. The lowest concentration of SARS-CoV-2 viral copies this assay can detect is 250 copies / mL. A negative result does not preclude SARS-CoV-2 infection and should not be used as the sole basis for treatment or other patient management decisions.  A negative result may occur with improper specimen collection / handling, submission of specimen other than nasopharyngeal swab, presence of viral mutation(s) within the areas targeted by this assay, and inadequate number of viral copies (<250 copies / mL). A negative result must be combined with clinical observations, patient history, and epidemiological information. Fact Sheet for Patients:   StrictlyIdeas.no Fact Sheet for Healthcare Providers: BankingDealers.co.za This test is not yet approved or cleared  by the [place] FDA and has been authorized for detection and/or diagnosis of SARS-CoV-2 by FDA under an Emergency Use Authorization (EUA).  This EUA will remain in effect (meaning this test can be used) for the duration of the COVID-19 declaration under Section 564(b)(1) of the Act, 21 U.S.C. section 360bbb-3(b)(1), unless the authorization is terminated or revoked sooner. Performed at [facility], [place] [address] [place], [place] [address]   Urinalysis, Routine w reflex microscopic     Status: Abnormal   Collection Time: 08/25/19  7:09 AM  Result Value Ref Range   Color, Urine YELLOW YELLOW   APPearance CLEAR CLEAR   Specific Gravity, Urine 1.024 1.005 - 1.030   pH 5.0 5.0 - 8.0   Glucose, UA NEGATIVE NEGATIVE mg/dL   Hgb urine dipstick NEGATIVE NEGATIVE   Bilirubin Urine NEGATIVE NEGATIVE   Ketones, ur 5 (A) NEGATIVE mg/dL   Protein, ur 30 (A) NEGATIVE mg/dL   Nitrite NEGATIVE NEGATIVE   Leukocytes,Ua NEGATIVE NEGATIVE   RBC / HPF 0-5 0 - 5 RBC/hpf   WBC, UA 0-5 0 - 5 WBC/hpf   Bacteria, UA FEW (A) NONE SEEN   Squamous  Epithelial / LPF 0-5 0 - 5   Mucus PRESENT     Comment: Performed at [facility], [place] [address], [place], [place] [address]   Ct Head Wo Contrast  Result Date: 08/25/2019 CLINICAL DATA:  Altered level of consciousness. EXAM: CT HEAD WITHOUT CONTRAST TECHNIQUE: Contiguous axial images were obtained from the base of the skull through the vertex without intravenous contrast. COMPARISON:  None. FINDINGS: Brain: A large left temporal and parietal parenchymal hematoma measures 7.8 x 3.3 x 3.0 cm. This extends into the left lateral ventricle. The left lateral ventricle is filled with blood. There is diffuse mass effect in the left hemisphere. Midline shift measures 8 mm. The right lateral ventricle is within normal limits. Cerebellar tonsils are at the level of the foramen magnum without herniation. There is some surrounding vasogenic edema associated with the hemorrhage. Vascular: No hyperdense vessel or unexpected calcification. Skull: Calvarium is intact. No focal lytic or blastic lesions are present. No significant extracranial soft tissue lesion is present. Sinuses/Orbits: The paranasal sinuses  and mastoid air cells are clear. The globes and orbits are within normal limits. IMPRESSION: 1. Large left temporal and parietal hematoma measures 7.8 x 3.3 x 3.0 cm. 2. Intraventricular extension of hemorrhage. 3. Diffuse left hemispheric mass effect with midline shift measuring 8 mm. Critical Value/emergent results were called by telephone at the time of interpretation on 08/25/2019 at 6:36 am to providerDONALD [facility] , who verbally acknowledged these results. Electronically Signed   By: Marin Roberts M.D.   On: 08/25/2019 06:38   Dg Chest Port 1 View  Result Date: 08/25/2019 CLINICAL DATA:  Altered mental status.  Status post intubation. EXAM: PORTABLE CHEST 1 VIEW COMPARISON:  Single-view of the chest earlier today. FINDINGS: Endotracheal tube is in place with the tip in good position approximately  3 cm above the carina. OG tube courses into the stomach and below the inferior margin of the film. Lungs are clear. Heart size is normal. No pneumothorax or pleural fluid. IMPRESSION: ETT in good position. OG tube courses into the stomach and below the film. No acute disease. Electronically Signed   By: Drusilla Kanner M.D.   On: 08/25/2019 07:33   Dg Chest Port 1 View  Result Date: 08/25/2019 CLINICAL DATA:  Altered mental status EXAM: PORTABLE CHEST 1 VIEW COMPARISON:  None. FINDINGS: The cardiac silhouette, mediastinal and hilar contours are within normal limits given the AP projection. Low lung volumes with streaky bibasilar atelectasis but no infiltrates, edema or effusions. The bony thorax is intact. IMPRESSION: No acute cardiopulmonary findings. Electronically Signed   By: Rudie Meyer M.D.   On: 08/25/2019 06:22    Review of Systems  Reason unable to perform ROS: intubated sedated.    Blood pressure (!) 155/129, pulse 80, temperature (!) 94.1 F (34.5 C), resp. rate 19, height 5\' 8"  (1.727 m), weight 81.6 kg, SpO2 100 %. Physical Exam  Neurological: She is unresponsive. GCS eye subscore is 1. GCS verbal subscore is 1. GCS motor subscore is 1.  Intubated, paralyzed and sedated. No exam per me, only by report     Assessment/Plan OR for emergent craniotomy. I have discussed with her sister explaining the rational for the case.   , MD 08/25/2019, 8:03 AM

## 2019-08-25 NOTE — Progress Notes (Signed)
According to pt's family, pt breastfeeds ~q3hrs. Breastpump and supplies were ordered. Will pump q3 hours and dump milk for the first 24 hrs. Pt is off of sedation. She is starting to become agitated and much more alert. Sister is at the bedside. Pain meds given. Asucena Galer, Rande Brunt, RN

## 2019-08-25 NOTE — Significant Event (Signed)
Rapid Response Event Note  Overview: Code Stroke  I came CT at [facility] and patient was already being transferred to OR for emergent craniotomy.   No RRT Interventions  Amanda Small R

## 2019-08-25 NOTE — Progress Notes (Signed)
Pt transported from ED Trauma B, to CT and then OR without incident.

## 2019-08-25 NOTE — Anesthesia Preprocedure Evaluation (Addendum)
Anesthesia Evaluation  Patient identified by MRN, date of birth, ID band Patient unresponsive    Reviewed: Allergy & Precautions, Patient's Chart, lab work & pertinent test resultsPreop documentation limited or incomplete due to emergent nature of procedure.  Airway Mallampati: Intubated       Dental   Pulmonary  Intubated      + intubated    Cardiovascular negative cardio ROS Normal cardiovascular exam  ECG: SR, rate 60   Neuro/Psych CT 1. Large left temporal and parietal hematoma measures 7.8 x 3.3 x 3.0 cm. 2. Intraventricular extension of hemorrhage. 3. Diffuse left hemispheric mass effect with midline shift measuring 8 mm.    GI/Hepatic negative GI ROS, (+)     substance abuse  marijuana use,   Endo/Other  negative endocrine ROS  Renal/GU negative Renal ROS     Musculoskeletal negative musculoskeletal ROS (+)   Abdominal   Peds  Hematology negative hematology ROS (+)   Anesthesia Other Findings   Reproductive/Obstetrics hcg negative                            Anesthesia Physical Anesthesia Plan  ASA: IV and emergent  Anesthesia Plan: General   Post-op Pain Management:    Induction:   PONV Risk Score and Plan: 3 and Ondansetron, Dexamethasone and Treatment may vary due to age or medical condition  Airway Management Planned: Oral ETT  Additional Equipment: Arterial line  Intra-op Plan:   Post-operative Plan: Post-operative intubation/ventilation  Informed Consent:   Plan Discussed with: CRNA  Anesthesia Plan Comments:        Anesthesia Quick Evaluation

## 2019-08-25 NOTE — Progress Notes (Signed)
Patient ID: Amanda Small, female   DOB: [birth year], 38 y.o.   MRN: [id number] BP 109/70   Pulse (!) 58   Temp 97.7 F (36.5 C)   Resp (!) 21   Ht 5\' 8"  (1.727 m)   Wt 86.6 kg   SpO2 99%   BMI 29.03 kg/m  Head CT reviewed, much improved. Ventricular catheter in good position

## 2019-08-25 NOTE — Progress Notes (Signed)
PT Cancellation Note  Patient Details Name: Amanda Small MRN: [id number] DOB: [birth year]    Cancelled Treatment:    Reason Eval/Treat Not Completed: Patient not medically ready - Pt recently back from surgery, currently with N/V. PT to check back at a later time.  [place] Pager [phone]  Office [phone]    [place] 08/25/2019, 3:46 PM

## 2019-08-25 NOTE — ED Notes (Addendum)
Patient is combative, unable to follow commands and is not re-directable.

## 2019-08-25 NOTE — ED Notes (Signed)
Bair Hugger applied

## 2019-08-25 NOTE — Anesthesia Procedure Notes (Signed)
Arterial Line Insertion Start/End12/12/2018 8:10 AM, 08/25/2019 8:12 AM Performed by: Lowella Dell, CRNA, CRNA  Patient location: OR. Preanesthetic checklist: patient identified, IV checked, site marked, monitors and equipment checked, timeout performed and anesthesia consent Emergency situation Right, radial was placed Catheter size: 20 G Allen's test indicative of satisfactory collateral circulation Attempts: 1 Procedure performed without using ultrasound guided technique. Following insertion, dressing applied and Biopatch. Post procedure assessment: normal  Patient tolerated the procedure well with no immediate complications.

## 2019-08-25 NOTE — ED Notes (Signed)
Pt sister, Aldona Bar, at bedside.
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
## 2019-08-25 NOTE — Plan of Care (Signed)
CT angio reviewed with Dr. Rosalin Hawking, from the stroke service. Some concern for a right-sided area Discussed with Dr. Christella Noa He will arrange with endovascular neurosurgery, Dr. Kathyrn Sheriff for a possible diagnostic cerebral angiogram.  Complaint of vomiting this evening, I decided to do the CAT scan now rather than wait till night. Postoperative changes from the craniotomy persistent hemorrhage in the left temporal region and may be within dilated left temporal horn or parenchymal and effacing the temporal lobe.  Persistent large volume hemorrhage within the left lateral ventricle with small amount of hemorrhage also present in the third and now in the fourth ventricles.  Placement of EVD catheter terminating near the right foramen of [place]ro no dilatation of the ventricles. Small volume subarachnoid over the left cerebral hemisphere.  Decreased mass-effect with decreased midline shift-was 8 mm now is 5 mm.   -- Amie Portland, MD [facility] Pager: [phone] If 7pm to 7am, please call on call as listed on AMION.

## 2019-08-25 NOTE — ED Notes (Signed)
Being transported to [facility] by Delia Chimes and Lytle Michaels EMT

## 2019-08-25 NOTE — H&P (Signed)
Neurology Evaluation/H&P  CC: confusion, ICH  History is obtained from: chart, sister  HPI: Amanda Small is a 38 y.o. female with no significant past medical history, last known normal 11 PM on 08/24/2019 when she went to bed, woke up this morning with a headache and neck pain.  Recently had a child and is still breast-feeding-delivered in August, was taking care of the child and told her mother that she cannot lift the child and was experiencing right-sided weakness.  Became confused, was noted by family to not be responding appropriately to questions.  EMS called and patient brought into the emergency room where a stat head CT was done that showed a large left temporal ICH with extension into the ventricles. Stat neurological consultation was placed, while the patient was being intubated for being unable to protect the airway.  Advised ED provider to activate code stroke. Evaluated the patient-had been intubated at the time of neurological evaluation.  Chemical paralysis used for intubation hence neurological exam was very limited. According to the triage nurse, upon arrival patient was able to say her name and was mumbling, she was moving all her extremities was definitely weaker on the right upper extremity compared to the left upper extremity.  She was blinking to threat from the left but probably not so much from the right and had a leftward gaze preference.  Based on initial nursing report, GCS was around 12 Patient sister at bedside denied any knowledge of patient abusing any drugs.  She says she uses marijuana.  No history of prior strokes or bleeds in the head.  No history of heart attacks. She did not know if the patient was feeling sick prior to presentation but she called the patient's best friend to confirm if she was doing any drugs or was feeling sick and the answers to both of those questions were no.  Pregnancy was unremarkable.  No history of high blood pressures.  No history of  blood thinner use.  Blood pressures on arrival were systolic 90s to 100s.  Blood pressures went into the 160s after intubation.  Propofol and Cleviprex were ordered.  Heart rate was fluctuating between 40s to 90s with frequent PVCs.  LKW: 11 PM on 08/24/2019 tpa given?: no, ICH Premorbid modified Rankin scale (mRS):0  ROS:  Unable to obtain due to altered mental status.  Review obtained from the sister documented in the HPI  No past medical history on file. No past history  No family history on file. No family history of intracerebral bleeds  Social History:   has no history on file for tobacco, alcohol, and drug. Uses marijuana.  Does not smoke or abuse alcohol  Medications  Current Facility-Administered Medications:  .  ondansetron (ZOFRAN) injection 4 mg, 4 mg, Intravenous, Once, Zadie RhineWickline, Donald, MD No current outpatient medications on file.   Exam: Current vital signs: BP (!) 155/129   Pulse 80   Temp (!) 94.1 F (34.5 C)   Resp 19   Ht 5\' 8"  (1.727 m)   Wt 81.6 kg   SpO2 100%   BMI 27.37 kg/m  Vital signs in last 24 hours: Temp:  [92.7 F (33.7 C)-95.6 F (35.3 C)] 94.1 F (34.5 C) (12/04 0745) Pulse Rate:  [46-92] 80 (12/04 0745) Resp:  [15-23] 19 (12/04 0745) BP: (91-182)/(59-129) 155/129 (12/04 0745) SpO2:  [100 %] 100 % (12/04 0745) FiO2 (%):  [100 %] 100 % (12/04 0658) Weight:  [81.6 kg] 81.6 kg (12/04 0713) Limited neurological  exam due to emergent RSI with chemical paralysis General: Sedated intubated HEENT: Normocephalic atraumatic CVS: Rate and rhythm were regular but heart rate fluctuated from 40s to 90s with frequent PVCs Respiratory: Vented Abdomen: Obese, nondistended Extremities warm well perfused Neurological exam Intubated, sedated with propofol, paralytics used for intubation a few minutes prior to this evaluation. Pupils were completely nonreactive, no corneals, breathing with the ventilator. No spontaneous movements No movement to  noxious stimulation NIHSS 1a Level of Conscious.: 2 1b LOC Questions: 2 1c LOC Commands: 2 2 Best Gaze: 0 3 Visual: 0 4 Facial Palsy: 0 5a Motor Arm - left: 4 5b Motor Arm - Right: 4 6a Motor Leg - Left: 4 6b Motor Leg - Right: 4 7 Limb Ataxia: 0 8 Sensory: 0 9 Best Language: 3 10 Dysarthria: 2 11 Extinct. and Inatten.: 0 TOTAL: 27  Labs I have reviewed labs in epic and the results pertinent to this consultation are:  CBC    Component Value Date/Time   WBC 9.6 08/25/2019 0608   RBC 4.06 08/25/2019 0608   HGB 12.0 08/25/2019 0608   HCT 38.2 08/25/2019 0608   PLT 217 08/25/2019 0608   MCV 94.1 08/25/2019 0608   MCH 29.6 08/25/2019 0608   MCHC 31.4 08/25/2019 0608   RDW 13.2 08/25/2019 0608   LYMPHSABS 3.3 08/25/2019 0608   MONOABS 0.6 08/25/2019 0608   EOSABS 0.3 08/25/2019 0608   BASOSABS 0.0 08/25/2019 0608    CMP     Component Value Date/Time   NA 140 08/25/2019 0608   K 3.1 (L) 08/25/2019 0608   CL 107 08/25/2019 0608   CO2 21 (L) 08/25/2019 0608   GLUCOSE 146 (H) 08/25/2019 0608   BUN 11 08/25/2019 0608   CREATININE 0.80 08/25/2019 0608   CALCIUM 8.7 (L) 08/25/2019 0608   PROT 6.5 08/25/2019 0608   ALBUMIN 3.8 08/25/2019 0608   AST 28 08/25/2019 0608   ALT 19 08/25/2019 0608   ALKPHOS 61 08/25/2019 0608   BILITOT 0.8 08/25/2019 0608   GFRNONAA >60 08/25/2019 0608   GFRAA >60 08/25/2019 8295    Imaging I have reviewed the images obtained:  CT-scan of the brain-large left temporal ICH with IVH, 8 mm midline shift. CTA head and neck-preliminary review with no underlying AVM or vascular malformation.  No aneurysm.  Preliminary radiology report agrees with above.   Assessment:  38 year old with no past medical history presenting with headache neck pain and altered mental status noted to have a large temporal ICH with IVH. Case discussed with neurosurgery after obtaining a CTA that did not demonstrate any aneurysm or underlying vascular  malformation-Dr. Franky Macho to take patient to the OR for evacuation and possible ventriculostomy. Patient was not hypertensive on arrival-unclear etiology of the bleed.  Might need a diagnostic cerebral angiogram later in the course of admission to evaluate for a potential vascular malformation that is missed on CTA.  Assessment:   Plan: Subcortical ICH, nontraumatic Cortical ICH, nontraumatic IVH, nontraumatic  Acuity: Acute Laterality: Left Current suspected etiology: Under investigation Treatment: -Admit to neuro ICU after evacuation/ventriculostomy -ICH Score: 3 -ICH Volume: About 40 cc -BP control goal SYS<140 -EVD management per NSGY -NSGY Consult for hematoma evacuation/decompressive hemicraniotomy-Dr. Franky Macho currently taking patient to the OR -PT/OT/ST  -neuromonitoring -Repeat imaging after 6 hours, wait for evacuation and neurosurgery input.  CNS Cerebral edema Compression of brain -Might need hyperosmolar-decision after follow-up imaging -Close neuro monitoring  Obstructive hydrocephalus -EVD per NSGY -monitor  Hemiplegia and hemiparesis following  nontraumatic intracerebral hemorrhage affecting right dominant side  -Continue PT/OT/ST  RESP Acute Respiratory Failure requiring emergent intubation in the emergency room -vent management per ICU -wean when able  CV No history of hypertension -Aggressive BP control, goal SBP < 140 -Cleviprex labetalol.  Frequent PVCs -Monitor labs -Continue telemetry -Would appreciate PCCM assistance in medical management  GI/GU No active issues Continue gentle hydration  HEME No active issues -transfuse for hgb < 7  Coagulopathy secondary to anticoagulation Thrombocytopenia -Goal INR is  -Reverse with [place] --Transfuse platelets -Trend PT/PTT/INR -TEG  ENDO Type 2 diabetes mellitus w/o complications . Type 2 diabetes mellitus with hyperglycemia  -SSI -Start oral meds -goal HgbA1c < 7  Fluid/Electrolyte  Disorders Hypokalemia -Patient in OR-replete when able to.  ID Possible Aspiration PNA -CXR -NPO -Monitor  Nutrition E66.9 Obesity  -diet consult   Prophylaxis DVT: SCD GI: PPI Bowel: Docusate senna  Dispo: To be decided  Diet: NPO until cleared by speech  Code Status: Full Code    THE FOLLOWING WERE PRESENT ON ADMISSION: Intracerebral hemorrhage with an ICH score of 3 that carries a 30-day mortality of 72%, intraventricular hemorrhage, coma, loss of consciousness, hemiplegia, respiratory failure, Foley catheter in place by ER, possible aspiration pneumonia, marijuana abuse,  -- Amie Portland, MD [facility] Pager: [phone] If 7pm to 7am, please call on call as listed on AMION.  CRITICAL CARE ATTESTATION Performed by: Amie Portland, MD Total critical care time: 60 minutes Critical care time was exclusive of separately billable procedures and treating other patients and/or supervising APPs/Residents/Students Critical care was necessary to treat or prevent imminent or life-threatening deterioration due to intracerebral hemorrhage, intraventricular hemorrhage This patient is critically ill and at significant risk for neurological worsening and/or death and care requires constant monitoring. Critical care was time spent personally by me on the following activities: development of treatment plan with patient and/or surrogate as well as nursing, discussions with consultants, evaluation of patient's response to treatment, examination of patient, obtaining history from patient or surrogate, ordering and performing treatments and interventions, ordering and review of laboratory studies, ordering and review of radiographic studies, pulse oximetry, re-evaluation of patient's condition, participation in multidisciplinary rounds and medical decision making of high complexity in the care of this patient.

## 2019-08-25 NOTE — Progress Notes (Signed)
Pt with episode of emesis. PRN given. Her neuro exam remains the same- lethargic, mute, moves all extremities, pupils brisk and reactive equally. MD aware. STAT head CT ordered. Yliana Gravois, Rande Brunt, RN

## 2019-08-25 NOTE — Plan of Care (Signed)
Patient examined in the ICU after emergent craniotomy for hematoma evacuation and EVD placement. She was extubated in the neuro ICU. She is drowsy, but opens eyes to voice.  Does not follow commands.  She has a leftward gaze preference.  Seen right after transfer to the ICU so suspect still has some effect of anesthetics.  Will attempt to reexamine.  Other than the plan documented in the H&P, she will need a repeat head CT tonight or sooner if the exam changes.  -- Amie Portland, MD [facility] Pager: [phone] If 7pm to 7am, please call on call as listed on AMION.

## 2019-08-25 NOTE — Progress Notes (Addendum)
NAME:  Amanda Small, MRN:  [id number], DOB:  June 08, 1981, LOS: 0 ADMISSION DATE:  08/25/2019, CONSULTATION DATE:  08/25/2019 REFERRING MD:  Wilford Corner - [facility], CHIEF COMPLAINT: Acute intracranial hemorrhage  HPI/course in hospital  38 year old woman with unheralded onset of headache and inability to use her right side.  Increasing somnolence.  Brought to ED via EMS. CT revealed large left temporal ICH with intraventricular extension. At that time, she was intubated for airway protection.  She was started on clevidipine for blood pressure control.  She underwent emergent evacuation of a left temporal clot and an EVD was left in place.  Past Medical History  History reviewed. No pertinent past medical history.  History reviewed. No pertinent surgical history.   Review of Systems:   Review of Systems  Unable to perform ROS: Critical illness    Social History    No history of tobacco alcohol.  Uses THC.  Family History   Her family history is not on file.   Allergies No Known Allergies   Home Medications  Prior to Admission medications   Not on File     Interim history/subjective:  Arrived in ICU with periods of agitation reaching for endotracheal tube.  Objective   Blood pressure (!) 99/48, pulse 88, temperature (!) 95.2 F (35.1 C), resp. rate 14, height 5\' 8"  (1.727 m), weight 86.6 kg, SpO2 99 %.    Vent Mode: PRVC FiO2 (%):  [100 %] 100 % Set Rate:  [16 bmp] 16 bmp Vt Set:  [510 mL] 510 mL PEEP:  [5 cmH20] 5 cmH20 Plateau Pressure:  [12 cmH20] 12 cmH20   Intake/Output Summary (Last 24 hours) at 08/25/2019 1222 Last data filed at 08/25/2019 1200 Gross per 24 hour  Intake 1300 ml  Output 1928 ml  Net -628 ml   Filed Weights   08/25/19 0713 08/25/19 1100  Weight: 81.6 kg 86.6 kg    Examination: Physical Exam Constitutional:      Appearance: Normal appearance.     Comments: Intermittently agitated and purposefully reaching for endotracheal  tube.  HENT:     Mouth/Throat:     Comments: Orally intubated.  OG tube in place. Cardiovascular:     Rate and Rhythm: Normal rate and regular rhythm.     Comments: Blood pressure controlled with clevidipine.  Radial art line in place. Pulmonary:     Effort: Pulmonary effort is normal.     Comments: Able to breathe spontaneously with excellent tidal volumes. Abdominal:     General: Abdomen is flat.  Skin:    General: Skin is warm.     Capillary Refill: Capillary refill takes less than 2 seconds.  Neurological:     General: No focal deficit present.     Comments: Not following commands.  Moves all limbs purposefully.  Still weakness on right side.      Ancillary tests (personally reviewed)  CBC: Recent Labs  Lab 08/25/19 0608  WBC 9.6  NEUTROABS 5.4  HGB 12.0  HCT 38.2  MCV 94.1  PLT 217    Basic Metabolic Panel: Recent Labs  Lab 08/25/19 0608  NA 140  K 3.1*  CL 107  CO2 21*  GLUCOSE 146*  BUN 11  CREATININE 0.80  CALCIUM 8.7*   GFR: Estimated Creatinine Clearance: 108.8 mL/min (by C-G formula based on SCr of 0.8 mg/dL). Recent Labs  Lab 08/25/19 0608  WBC 9.6    Liver Function Tests: Recent Labs  Lab 08/25/19 0608  AST  28  ALT 19  ALKPHOS 61  BILITOT 0.8  PROT 6.5  ALBUMIN 3.8   No results for input(s): LIPASE, AMYLASE in the last 168 hours. Recent Labs  Lab 08/25/19 0608  AMMONIA 43*    ABG No results found for: PHART, PCO2ART, PO2ART, HCO3, TCO2, ACIDBASEDEF, O2SAT   Coagulation Profile: No results for input(s): INR, PROTIME in the last 168 hours.  Cardiac Enzymes: Recent Labs  Lab 08/25/19 0608  CKTOTAL 102    HbA1C: No results found for: HGBA1C  CBG: No results for input(s): GLUCAP in the last 168 hours.  CT head 12/4: 1. Large left temporal and parietal hematoma measures 7.8 x 3.3 x 3.0 cm. 2. Intraventricular extension of hemorrhage. 3. Diffuse left hemispheric mass effect with midline shift measuring 8  mm.  Assessment & Plan:   Critically ill due to acute hypoxic hypercapnic respiratory failure requiring mechanical ventilation. Acute intracerebral hemorrhage of unclear etiology.  ICH score of 3 but favorable ICH functional score. Possible AV malformation. Status post clot evacuation.  She is now purposeful and has acceptable lung mechanics.  Mechanical decompression of the clot may have improved her mental status to the point where she is able to tolerate extubation. Continue tight blood pressure control.  Daily Goals Checklist  Pain/Anxiety/Delirium protocol (if indicated): Propofol discontinued preextubation, morphine as needed per neurosurgery VAP protocol (if indicated): Discontinued following extubation Respiratory support goals: Extubated to nasal cannula.  Remains at high risk for aspiration and reintubation. Blood pressure target: On clevidipine, systolic blood pressure target 100-120 DVT prophylaxis: SCDs for now.  Can start chemical prophylaxis in 48 hours if no evidence of further bleeding. Nutritional status and feeding goals: Low nutritional risk.  Likely swallowing impairment.  Will place feeding tube if not able to swallow. GI prophylaxis: Not indicated Fluid status goals: Allow autoregulation Urinary catheter: Assessment of intravascular volume Central lines: No central lines.  EVD in place. Glucose control: Euglycemic with no treatment. Mobility/therapy needs: PT/OT consultation. Antibiotic de-escalation: On no antibiotics Home medication reconciliation: No home medications Daily labs: Daily BMP Code Status: Full code Family Communication: Mother will be updated at bedside. Disposition: ICU.  CRITICAL CARE Performed by: Kipp Brood   Total critical care time: 40 minutes  Critical care time was exclusive of separately billable procedures and treating other patients.  Critical care was necessary to treat or prevent imminent or life-threatening  deterioration.  Critical care was time spent personally by me on the following activities: development of treatment plan with patient and/or surrogate as well as nursing, discussions with consultants, evaluation of patient's response to treatment, examination of patient, obtaining history from patient or surrogate, ordering and performing treatments and interventions, ordering and review of laboratory studies, ordering and review of radiographic studies, pulse oximetry, re-evaluation of patient's condition and participation in multidisciplinary rounds.  Kipp Brood, MD [facility] ICU Physician [place]  Pager: [phone] Mobile: [phone] After hours: [phone].        08/25/2019, 12:22 PM

## 2019-08-25 NOTE — ED Notes (Addendum)
Patient actively vomiting. Continues to not respond appropriately.

## 2019-08-26 ENCOUNTER — Inpatient Hospital Stay (HOSPITAL_COMMUNITY): Payer: 59

## 2019-08-26 DIAGNOSIS — I607 Nontraumatic subarachnoid hemorrhage from unspecified intracranial artery: Secondary | ICD-10-CM | POA: Diagnosis not present

## 2019-08-26 LAB — URINE CULTURE: Culture: NO GROWTH

## 2019-08-26 MED ORDER — POTASSIUM CHLORIDE 10 MEQ/100ML IV SOLN
10.0000 meq | INTRAVENOUS | Status: AC
  Administered 2019-08-26 (×4): 10 meq via INTRAVENOUS
  Filled 2019-08-26 (×4): qty 100

## 2019-08-26 MED ORDER — ATROPINE SULFATE 1 MG/ML IJ SOLN
1.0000 mg | INTRAMUSCULAR | Status: DC | PRN
  Filled 2019-08-26: qty 1

## 2019-08-26 NOTE — Progress Notes (Signed)
eLink Physician-Brief Progress Note Patient Name: Amanda Small DOB: Jul 08, 1981 MRN: [id number]   Date of Service  08/26/2019  HPI/Events of Note  Episodes of bradycardia into the 30's with stable BP. BP currently = 101/52.  eICU Interventions  Will order: 1. Atropine 1 mg at bedside at all times.      Intervention Category Major Interventions: Arrhythmia - evaluation and management  Sommer,Steven Cornelia Copa 08/26/2019, 9:37 PM

## 2019-08-26 NOTE — Progress Notes (Signed)
Pt complaining of HA and vomited x1, Dr.Ahern notified, STAT CT ordered.

## 2019-08-26 NOTE — Progress Notes (Signed)
STROKE TEAM PROGRESS NOTE   HISTORY OF PRESENT ILLNESS (per record) Amanda Small is a 38 y.o. female with no significant past medical history, last known normal 11 PM on 08/24/2019 when she went to bed, woke up this morning with a headache and neck pain.  Recently had a child and is still breast-feeding-delivered in August, was taking care of the child and told her mother that she cannot lift the child and was experiencing right-sided weakness.  Became confused, was noted by family to not be responding appropriately to questions.  EMS called and patient brought into the emergency room where a stat head CT was done that showed a large left temporal ICH with extension into the ventricles. Stat neurological consultation was placed, while the patient was being intubated for being unable to protect the airway.  Advised ED provider to activate code stroke. Evaluated the patient-had been intubated at the time of neurological evaluation.  Chemical paralysis used for intubation hence neurological exam was very limited. According to the triage nurse, upon arrival patient was able to say her name and was mumbling, she was moving all her extremities was definitely weaker on the right upper extremity compared to the left upper extremity. She was blinking to threat from the left but probably not so much from the right and had a leftward gaze preference.  Based on initial nursing report, GCS was around 12 Patient sister at bedside denied any knowledge of patient abusing any drugs.  She says she uses marijuana.  No history of prior strokes or bleeds in the head.  No history of heart attacks. She did not know if the patient was feeling sick prior to presentation but she called the patient's best friend to confirm if she was doing any drugs or was feeling sick and the answers to both of those questions were no. Pregnancy was unremarkable.  No history of high blood pressures.  No history of blood thinner use. Blood  pressures on arrival were systolic 16X to 096E.  Blood pressures went into the 160s after intubation.  Propofol and Cleviprex were ordered.  Heart rate was fluctuating between 40s to 90s with frequent PVCs.  LKW: 11 PM on 08/24/2019 tpa given?: no, ICH Premorbid modified Rankin scale (mRS):0   INTERVAL HISTORY Her family is not at bedside today.  Patient had an episode of vomiting, repeat CT scan of the head was stable.very unfortunate situation, recently had a child an dis breastfeeding, woke up with hedaache and neck pain, right-sided weakness. Was taken to OR for evacuation of ICH with IVE. CTA with no underlying vascular lesion. EVD draining. Unclear etiology of bleed.    OBJECTIVE Vitals:   08/26/19 0500 08/26/19 0600 08/26/19 0700 08/26/19 0800  BP: (!) 115/50 100/74 107/76 (!) 91/53  Pulse: 67 (!) 58 62 (!) 51  Resp: (!) 22 (!) 25 19 (!) 27  Temp: 99.1 F (37.3 C) 99.1 F (37.3 C) 99.1 F (37.3 C) 99.1 F (37.3 C)  TempSrc:    Bladder  SpO2: 100% 96% 96% 100%  Weight:      Height:        CBC:  Recent Labs  Lab 08/25/19 0608  WBC 9.6  NEUTROABS 5.4  HGB 12.0  HCT 38.2  MCV 94.1  PLT 454    Basic Metabolic Panel:  Recent Labs  Lab 08/25/19 0608  NA 140  K 3.1*  CL 107  CO2 21*  GLUCOSE 146*  BUN 11  CREATININE 0.80  CALCIUM  8.7*    Lipid Panel:     Component Value Date/Time   CHOL 211 (H) 08/25/2019 1139   TRIG 153 (H) 08/25/2019 1139   TRIG 153 (H) 08/25/2019 1139   HDL 56 08/25/2019 1139   CHOLHDL 3.8 08/25/2019 1139   VLDL 31 08/25/2019 1139   LDLCALC 124 (H) 08/25/2019 1139   HgbA1c:  Lab Results  Component Value Date   HGBA1C 4.7 (L) 08/25/2019   Urine Drug Screen:     Component Value Date/Time   LABOPIA NONE DETECTED 08/25/2019 0613   COCAINSCRNUR NONE DETECTED 08/25/2019 0613   LABBENZ NONE DETECTED 08/25/2019 0613   AMPHETMU NONE DETECTED 08/25/2019 0613   THCU POSITIVE (A) 08/25/2019 0613   LABBARB NONE DETECTED 08/25/2019  0613    Alcohol Level     Component Value Date/Time   ETH <10 08/25/2019 8527    IMAGING  Ct Angio Head W Or Wo Contrast Ct Angio Neck W Or Wo Contrast 08/25/2019 IMPRESSION:  1. Normal variant CTA Circle of Willis without significant proximal stenosis, aneurysm, or branch vessel occlusion. No acute or focal lesion to explain the left temporal lobe hemorrhage.  2. Normal CTA of the neck.  3. Endotracheal tube terminates 2 cm above the carina and could be pulled back 1-2 cm for more optimal positioning.   Ct Head Wo Contrast 08/25/2019 IMPRESSION:  1. Postoperative changes from interval left craniotomy and left temporoparietal parenchymal hematoma evacuation. Persistent hemorrhage in the left temporal region measures 2.6 x 1.4 x 2.7 cm, and may be within a dilated left temporal horn or parenchymal and effacing the left temporal.  2. Persistent large volume hemorrhage within the left lateral ventricle. Small amount of hemorrhage also present within the third and now fourth ventricles.  3. Interval placement of a right frontal approach ventricular catheter terminating near the right foramen of Monro. The right lateral, third and fourth ventricles do not appear dilated.  4. Small volume subarachnoid hemorrhage overlying the left cerebral hemisphere.  5. Decreased mass effect with interval decrease in rightward midline shift, now 5 mm.   Ct Head Wo Contrast 08/25/2019 IMPRESSION:  1. Large left temporal and parietal hematoma measures 7.8 x 3.3 x 3.0 cm.  2. Intraventricular extension of hemorrhage.  3. Diffuse left hemispheric mass effect with midline shift measuring 8 mm.   Dg Chest Port 1 View 08/25/2019 IMPRESSION:  ETT in good position. OG tube courses into the stomach and below the film. No acute disease.   Dg Chest Port 1 View 08/25/2019 IMPRESSION:  No acute cardiopulmonary findings.    ECG - SR rate 60 BPM. (See cardiology reading for complete details)   PHYSICAL  EXAM Blood pressure (!) 91/53, pulse (!) 51, temperature 99.1 F (37.3 C), temperature source Bladder, resp. rate (!) 27, height 5\' 8"  (1.727 m), weight 86.6 kg, SpO2 100 %.   ASSESSMENT/PLAN [name] is a 38 y.o. female with history of recent pregnancy with delivery in August presenting with HA, AMS, right sided weakness, and left gaze preference. She did not receive IV t-PA due to hemorrhage. Left temporal craniotomy and placement of EVD catheter 08/25/19. Will need cerebral angiogram at some point - Dr 14/4/20.  Stroke: Large left temporal and parietal hematoma unknown etiology recently had a child still breastfeeding  Code Stroke CT Head - not ordered  CT head - Large left temporal and parietal hematoma measures 7.8 x 3.3 x 3.0 cm. Intraventricular extension of hemorrhage. Diffuse left hemispheric mass effect  with midline shift measuring 8 mm.   Repeat CTH - Postoperative changes from interval left craniotomy and left temporoparietal parenchymal hematoma evacuation. Persistent hemorrhage in the left temporal region measures 2.6 x 1.4 x 2.7 cm, and may be within a dilated left temporal horn or parenchymal and effacing the left temporal. Persistent large volume hemorrhage within the left lateral ventricle. Small amount of hemorrhage also present within the third and now fourth ventricles. The right lateral, third and fourth ventricles do not appear dilated. Small volume subarachnoid hemorrhage overlying the left cerebral hemisphere. Decreased mass effect with interval decrease in rightward midline shift, now 5 mm.  MRI head - not ordered  MRA head - not ordered  CTA H&N - unremarkable   CT Perfusion - not ordered  Carotid Doppler - CTA neck performed - carotid dopplers not indicated.  2D Echo - not ordered  [employer] Corona Virus 2 - negative  LDL - 124  HgbA1c - 4.7  UDS - THCU  VTE prophylaxis - SCDs Diet  Diet Order            Diet NPO time specified  Diet  effective now              No antithrombotic prior to admission, now on No antithrombotic  Ongoing aggressive stroke risk factor management  Therapy recommendations:  pending  Disposition:  Pending  Left temporal craniotomy and placement of EVD catheter 08/25/19 - Dr Franky Machoabbell  Will need cerebral angiogram at some point - Dr Conchita ParisNundkumar.  Now on Keppra  Hypertension  Home BP meds: none   Current BP meds: Labetalol prn  Blood pressure runs low   SBP goal < 140 mm Hg . Long-term BP goal normotensive  Hyperlipidemia  Home Lipid lowering medication: none  LDL 124, goal < 70   Current lipid lowering medication: None (statin contraindicated with ICH)  Consider statin at discharge  Other Stroke Risk Factors Obesity, Body mass index is 29.03 kg/m., recommend weight loss, diet and exercise as appropriate   Other Active Problems  Recent pregnancy with delivery in August  Temp 99.1 - IV Ancef started 08/25/19  Mild bradycardia  Hypokalemia - 3.1 - supplement and recheck   Hospital day # 1  This patient is critically ill and at significant risk of neurological worsening, death and care requires constant monitoring of vital signs, hemodynamics,respiratory and cardiac monitoring,review of multiple databases, neurological assessment, discussion with family, other specialists and medical decision making of high complexity.I  I spent 30 minutes of neurocritical care time in the care of this patient.  Naomie DeanAntonia Kalianne Fetting, MD [facility]  To contact Stroke Continuity provider, please refer to [email or web address]. After hours, contact General Neurology

## 2019-08-26 NOTE — Progress Notes (Addendum)
SLP Cancellation Note  Patient Details Name: ALISANDRA SON MRN: [id number] DOB: 16-Mar-1981   Cancelled treatment:        Attempted to see pt for swallowing evaluation.  Pt is very lethargic at this time and is not appropriate for PO trials at present.  SLP will reattempt as schedule permits.  ADDENDUM: Reattempted clinical swallow evaluation.  Pt remains very lethargic and unable to participate in PO trials.  Family present.  Briefly spoke to sister to let her know that SLP consult is in and we will reattempt as appropriate.   Celedonio Savage, MA, [place] Office: [phone]; Pager [phone]): [phone] 08/26/2019, 9:35 AM, 1:04 PM

## 2019-08-26 NOTE — Progress Notes (Addendum)
PT Cancellation Note  Patient Details Name: BRENEE GAJDA MRN: [id number] DOB: 07/23/1981   Cancelled Treatment:    Reason Eval/Treat Not Completed: Other (comment) Verbal order per Dr. Lynetta Mare that pt appropriate to see for PT evaluation (despite bedrest order). Pt pumping upon arrival. Will follow up.  Ellamae Sia, PT, DPT Acute Rehabilitation Services Pager [phone] Office [phone]    Willy Eddy 08/26/2019, 2:34 PM

## 2019-08-26 NOTE — Progress Notes (Signed)
OT Cancellation Note  Patient Details Name: Amanda Small MRN: [id number] DOB: Oct 22, 1980   Cancelled Treatment:    Reason Eval/Treat Not Completed: Active bedrest order(Will return as schedule allows.)  [place] MSOT, OTR/L Acute Rehab Pager: [phone] Office: [phone] 08/26/2019, 6:54 AM

## 2019-08-26 NOTE — Progress Notes (Addendum)
NAME:  Amanda Small, MRN:  [id number], DOB:  01/22/1981, LOS: 1 ADMISSION DATE:  08/25/2019, CONSULTATION DATE:  08/25/2019 REFERRING MD:  Rory Percy - [facility] neuro hospitalist, CHIEF COMPLAINT: Acute intracranial hemorrhage  HPI/course in hospital  38 year old woman with unheralded onset of headache and inability to use her right side.  Increasing somnolence.  Brought to ED via EMS. CT revealed large left temporal ICH with intraventricular extension. At that time, she was intubated for airway protection.  She was started on clevidipine for blood pressure control.  She underwent emergent evacuation of a left temporal clot and an EVD was left in place.  Past Medical History  History reviewed. No pertinent past medical history.  History reviewed. No pertinent surgical history.     Interim history/subjective:  Episode of emesis overnight.  Objective   Blood pressure 107/76, pulse (!) 51, temperature 99.1 F (37.3 C), temperature source Bladder, resp. rate (!) 27, height 5\' 8"  (1.727 m), weight 86.6 kg, SpO2 100 %.    Vent Mode: PRVC FiO2 (%):  [100 %] 100 % Set Rate:  [16 bmp] 16 bmp Vt Set:  [510 mL] 510 mL PEEP:  [5 cmH20] 5 cmH20 Plateau Pressure:  [12 cmH20] 12 cmH20   Intake/Output Summary (Last 24 hours) at 08/26/2019 0816 Last data filed at 08/26/2019 0800 Gross per 24 hour  Intake 1991.28 ml  Output 3229 ml  Net -1237.72 ml   Filed Weights   08/25/19 0713 08/25/19 1100  Weight: 81.6 kg 86.6 kg    Examination: Physical Exam Constitutional:      Appearance: Normal appearance.  HENT:     Head:     Comments: EVD in place draining minimally blood tinged fluid Cardiovascular:     Rate and Rhythm: Normal rate and regular rhythm.     Comments: Blood pressure controlled without Pulmonary:     Effort: Pulmonary effort is normal.  Abdominal:     General: Abdomen is flat.  Skin:    General: Skin is warm.     Capillary Refill: Capillary refill takes less than 2 seconds.   Neurological:     Comments: Somnolent but follows simple commands.  Moves all limbs purposefully.  Marked aphasia      Ancillary tests (personally reviewed)  CBC: Recent Labs  Lab 08/25/19 0608  WBC 9.6  NEUTROABS 5.4  HGB 12.0  HCT 38.2  MCV 94.1  PLT 355    Basic Metabolic Panel: Recent Labs  Lab 08/25/19 0608  NA 140  K 3.1*  CL 107  CO2 21*  GLUCOSE 146*  BUN 11  CREATININE 0.80  CALCIUM 8.7*   GFR: Estimated Creatinine Clearance: 108.8 mL/min (by C-G formula based on SCr of 0.8 mg/dL). Recent Labs  Lab 08/25/19 0608  WBC 9.6    Liver Function Tests: Recent Labs  Lab 08/25/19 0608  AST 28  ALT 19  ALKPHOS 61  BILITOT 0.8  PROT 6.5  ALBUMIN 3.8   No results for input(s): LIPASE, AMYLASE in the last 168 hours. Recent Labs  Lab 08/25/19 0608  AMMONIA 43*    ABG No results found for: PHART, PCO2ART, PO2ART, HCO3, TCO2, ACIDBASEDEF, O2SAT   Coagulation Profile: No results for input(s): INR, PROTIME in the last 168 hours.  Cardiac Enzymes: Recent Labs  Lab 08/25/19 0608  CKTOTAL 102    HbA1C: Hgb A1c MFr Bld  Date/Time Value Ref Range Status  08/25/2019 11:39 AM 4.7 (L) 4.8 - 5.6 % Final    Comment:    (  NOTE) Pre diabetes:          5.7%-6.4% Diabetes:              >6.4% Glycemic control for   <7.0% adults with diabetes     CBG: No results for input(s): GLUCAP in the last 168 hours.  CT head 12/4: 1. Large left temporal and parietal hematoma measures 7.8 x 3.3 x 3.0 cm. 2. Intraventricular extension of hemorrhage. 3. Diffuse left hemispheric mass effect with midline shift measuring 8 mm.  CT head 12/5: decrease midline shift.   Assessment & Plan:   Critically ill due to acute hypoxic hypercapnic respiratory failure requiring mechanical ventilation. Acute intracerebral hemorrhage of unclear etiology.  ICH score of 3 but favorable ICH functional score. Possible AV malformation. Status post clot evacuation.  She is  very purposeful but remains profoundly aphasic which will likely be her major deficit.   Daily Goals Checklist  Pain/Anxiety/Delirium protocol (if indicated): morphine as needed per neurosurgery VAP protocol (if indicated): Discontinued following extubation Respiratory support goals: Extubated to nasal cannula.  Remains at high risk for aspiration and reintubation. Blood pressure target: maintaining SBP 100-120 spontaneously DVT prophylaxis: SCDs for now.  Can start chemical prophylaxis in 48 hours if no evidence of further bleeding. Nutritional status and feeding goals: Low nutritional risk.  Likely swallowing impairment.  Will place feeding tube if not able to swallow. GI prophylaxis: Not indicated Fluid status goals: Allow autoregulation Urinary catheter: will remove. Central lines: No central lines.  EVD in place. Remove arterial line Glucose control: Euglycemic with no treatment. Mobility/therapy needs: PT/OT/SLP consultation. Antibiotic de-escalation: On no antibiotics Home medication reconciliation: No home medications Daily labs: Daily BMP Code Status: Full code Family Communication: Mother will be updated at bedside. Disposition: ICU.  Lynnell Catalan, MD [facility] ICU Physician [facility] [place] [facility]  Pager: [phone] Mobile: [phone] After hours: [phone].  08/26/2019, 8:28 AM

## 2019-08-27 DIAGNOSIS — I607 Nontraumatic subarachnoid hemorrhage from unspecified intracranial artery: Secondary | ICD-10-CM | POA: Diagnosis not present

## 2019-08-27 LAB — CBC
HCT: 32 % — ABNORMAL LOW (ref 36.0–46.0)
Hemoglobin: 10.2 g/dL — ABNORMAL LOW (ref 12.0–15.0)
MCH: 29.7 pg (ref 26.0–34.0)
MCHC: 31.9 g/dL (ref 30.0–36.0)
MCV: 93 fL (ref 80.0–100.0)
Platelets: 193 10*3/uL (ref 150–400)
RBC: 3.44 MIL/uL — ABNORMAL LOW (ref 3.87–5.11)
RDW: 13.2 % (ref 11.5–15.5)
WBC: 12.9 10*3/uL — ABNORMAL HIGH (ref 4.0–10.5)
nRBC: 0 % (ref 0.0–0.2)

## 2019-08-27 LAB — BASIC METABOLIC PANEL
Anion gap: 8 (ref 5–15)
BUN: 7 mg/dL (ref 6–20)
CO2: 25 mmol/L (ref 22–32)
Calcium: 9.1 mg/dL (ref 8.9–10.3)
Chloride: 108 mmol/L (ref 98–111)
Creatinine, Ser: 0.57 mg/dL (ref 0.44–1.00)
GFR calc Af Amer: >60 mL/min (ref >60)
GFR calc non Af Amer: >60 mL/min (ref >60)
Glucose, Bld: 131 mg/dL — ABNORMAL HIGH (ref 70–99)
Potassium: 3.5 mmol/L (ref 3.5–5.1)
Sodium: 141 mmol/L (ref 135–145)

## 2019-08-27 MED ORDER — HYDRALAZINE HCL 20 MG/ML IJ SOLN: 10.0000 mg | Freq: Four times a day (QID) | INTRAMUSCULAR | Status: DC | PRN

## 2019-08-27 NOTE — Progress Notes (Signed)
NAME:  Amanda Small, MRN:  [id number], DOB:  01-28-1981, LOS: 2 ADMISSION DATE:  08/25/2019, CONSULTATION DATE:  08/25/2019 REFERRING MD:  Rory Percy - [facility] neuro hospitalist, CHIEF COMPLAINT: Acute intracranial hemorrhage  HPI/course in hospital  38 year old woman with unheralded onset of headache and inability to use her right side.  Increasing somnolence.  Brought to ED via EMS. CT revealed large left temporal ICH with intraventricular extension. At that time, she was intubated for airway protection.  She was started on clevidipine for blood pressure control.  She underwent emergent evacuation of a left temporal clot and an EVD was left in place.  Past Medical History  History reviewed. No pertinent past medical history.  History reviewed. No pertinent surgical history.     Interim history/subjective:  Sinus bradycardia overnight while sleeping.  Had further episode of emesis yesterday and complained of headache.  Able to talk when pressed.   Objective   Blood pressure (!) 101/57, pulse (!) 57, temperature 98.9 F (37.2 C), temperature source Oral, resp. rate 20, height 5\' 8"  (1.727 m), weight 86.6 kg, SpO2 100 %.        Intake/Output Summary (Last 24 hours) at 08/27/2019 0752 Last data filed at 08/27/2019 0700 Gross per 24 hour  Intake 869.9 ml  Output 709 ml  Net 160.9 ml   Filed Weights   08/25/19 0713 08/25/19 1100  Weight: 81.6 kg 86.6 kg    Examination: Physical Exam Constitutional:      Appearance: Normal appearance.  HENT:     Head:     Comments: EVD set at 6 in place draining minimally blood tinged fluid. Left periorbital ecchymosis and craniotomy dressing in place.  Cardiovascular:     Rate and Rhythm: Normal rate and regular rhythm.     Comments: Blood pressure controlled without infusion. Pulmonary:     Effort: Pulmonary effort is normal.  Abdominal:     General: Abdomen is flat.  Skin:    General: Skin is warm.     Capillary Refill: Capillary refill  takes less than 2 seconds.  Neurological:     Comments: Somnolent but follows simple commands.  Moves all limbs purposefully.  Marked aphasia      Ancillary tests (personally reviewed)  CBC: Recent Labs  Lab 08/25/19 0608  WBC 9.6  NEUTROABS 5.4  HGB 12.0  HCT 38.2  MCV 94.1  PLT 169    Basic Metabolic Panel: Recent Labs  Lab 08/25/19 0608  NA 140  K 3.1*  CL 107  CO2 21*  GLUCOSE 146*  BUN 11  CREATININE 0.80  CALCIUM 8.7*   GFR: Estimated Creatinine Clearance: 108.8 mL/min (by C-G formula based on SCr of 0.8 mg/dL). Recent Labs  Lab 08/25/19 0608  WBC 9.6    Liver Function Tests: Recent Labs  Lab 08/25/19 0608  AST 28  ALT 19  ALKPHOS 61  BILITOT 0.8  PROT 6.5  ALBUMIN 3.8   No results for input(s): LIPASE, AMYLASE in the last 168 hours. Recent Labs  Lab 08/25/19 0608  AMMONIA 43*    ABG No results found for: PHART, PCO2ART, PO2ART, HCO3, TCO2, ACIDBASEDEF, O2SAT   Coagulation Profile: No results for input(s): INR, PROTIME in the last 168 hours.  Cardiac Enzymes: Recent Labs  Lab 08/25/19 0608  CKTOTAL 102    HbA1C: Hgb A1c MFr Bld  Date/Time Value Ref Range Status  08/25/2019 11:39 AM 4.7 (L) 4.8 - 5.6 % Final    Comment:    (NOTE)  Pre diabetes:          5.7%-6.4% Diabetes:              >6.4% Glycemic control for   <7.0% adults with diabetes     CBG: No results for input(s): GLUCAP in the last 168 hours.  CT head 12/4: 1. Large left temporal and parietal hematoma measures 7.8 x 3.3 x 3.0 cm. 2. Intraventricular extension of hemorrhage. 3. Diffuse left hemispheric mass effect with midline shift measuring 8 mm.  CT head 12/5: midline shift resolved. Left lateral ventricle is casted.   Assessment & Plan:   Critically ill due to acute hypoxic hypercapnic respiratory failure requiring mechanical ventilation. Acute intracerebral hemorrhage of unclear etiology.  ICH score of 3 but favorable ICH functional score. Possible  AV malformation. Status post clot evacuation.  She is very purposeful but remains profoundly aphasic which will likely be her major deficit.  Remains in ICU for ventriculostomy management only, but drainage is minimal and may be ready for clamping trial.   No active critical care issues at this time. PCCM will sign off.  Daily Goals Checklist  Pain/Anxiety/Delirium protocol (if indicated): morphine as needed per neurosurgery VAP protocol (if indicated): Discontinued following extubation Respiratory support goals: Extubated to nasal cannula.  Remains at high risk for aspiration and reintubation. Blood pressure target: maintaining SBP 100-120 spontaneously DVT prophylaxis: SCDs for now. Hold chemical prophylaxis until cleared by Neurosurgery.  Nutritional status and feeding goals: Low nutritional risk.  Likely swallowing impairment.  Will place feeding tube if not able to swallow. GI prophylaxis: Not indicated Fluid status goals: Allow autoregulation Urinary catheter: removed yesterday. consider urecholine if retention Central lines: No central lines.  EVD in place. Remove arterial line Glucose control: Euglycemic with no treatment. Mobility/therapy needs: PT/OT/SLP consultation. Antibiotic de-escalation: On no antibiotics Home medication reconciliation: No home medications Daily labs: Daily BMP Code Status: Full code Family Communication: Mother will be updated at bedside. Disposition: ICU.  Lynnell Catalan, MD [facility] ICU Physician [facility] [place] [facility]  Pager: [phone] Mobile: [phone] After hours: [phone].  08/27/2019, 7:52 AM

## 2019-08-27 NOTE — Evaluation (Signed)
Occupational Therapy Evaluation Patient Details Name: Amanda Small MRN: [id number] DOB: June 19, 1981 Today's Date: 08/27/2019    History of Present Illness Pt is a 38 year old woman with unheralded onset of headache and inability to use her right side. CT revealed large left temporal ICH with intraventricular extension. Pt was intubated for airway protection. PT s/p left temporal craniotomy for hematoma evacuation and right frontal ventricular catheter placement. No pertinent history.   Clinical Impression   Pt PTA: Pt living at home as a new mom, independently. Pt currently with no verbalizations throughout session. Pt limited by impulsivity, weakness, poor coordination and increased assist required for ADL and mobility.  Mobility deferred as pt impulsively raising BLEs back on bed and attempting to lie back down. Pt with grip in both hands; BUEs AAROM, WFLs. Pt kept eyes closed for most of session. Pt currently following 90% of commands. Pt impulsively wanting to lay back down in bed instead of perform standing task. OT to continue to follow for ADL, cognition, any visual deficits that arise and safety. Pt appears to be motivated to want to progress, but limited at this time. OT following acutely.    Follow Up Recommendations  CIR;Supervision/Assistance - 24 hour    Equipment Recommendations  Other (comment)(to be determined)    Recommendations for Other Services       Precautions / Restrictions Precautions Precautions: Fall;Other (comment) Precaution Comments: EVD requires clamp Restrictions Weight Bearing Restrictions: No      Mobility Bed Mobility Overal bed mobility: Needs Assistance Bed Mobility: Rolling;Sidelying to Sit;Sit to Sidelying Rolling: Min assist Sidelying to sit: Min assist     Sit to sidelying: Min assist General bed mobility comments: minA overall for trunk stability and facilitating movement as pt not wanting to open eyes  Transfers Overall transfer  level: Needs assistance               General transfer comment: deferred as pt impulsively raising BLEs back on bed and attempting to lie back down.    Balance Overall balance assessment: Needs assistance Sitting-balance support: Bilateral upper extremity supported Sitting balance-Leahy Scale: Poor Sitting balance - Comments: minguardA continuously Postural control: Posterior lean                                 ADL either performed or assessed with clinical judgement   ADL Overall ADL's : Needs assistance/impaired     Grooming: Minimal assistance Grooming Details (indicate cue type and reason): hand over hand assist; eventually washing face without assist Upper Body Bathing: Minimal assistance;Sitting   Lower Body Bathing: Minimal assistance;Sitting/lateral leans;Sit to/from stand;Cueing for safety;Cueing for sequencing   Upper Body Dressing : Minimal assistance;Sitting   Lower Body Dressing: Minimal assistance;Sit to/from stand;Sitting/lateral leans;Cueing for safety;+2 for physical assistance;+2 for safety/equipment Lower Body Dressing Details (indicate cue type and reason): Pt seated EOB for sock donning task- pt leaning over to apply socks with minguardA for sitting balance   Toilet Transfer Details (indicate cue type and reason): deferred as pt trying to get back in bed. Toileting- Clothing Manipulation and Hygiene: Minimal assistance;Moderate assistance;Cueing for safety;Cueing for sequencing;+2 for safety/equipment;+2 for physical assistance;Sitting/lateral lean;Sit to/from stand       Functional mobility during ADLs: Minimal assistance;Moderate assistance;+2 for physical assistance;+2 for safety/equipment;Cueing for safety;Cueing for sequencing General ADL Comments: Pt limited by impulsivity, weakness, poor coordination and increased assist required for ADL and mobility.      Vision  Vision Assessment?: Vision impaired- to be further tested in  functional context Additional Comments: Pt kept eyes closed for most of session.     Perception     Praxis      Pertinent Vitals/Pain       Hand Dominance Right   Extremity/Trunk Assessment Upper Extremity Assessment Upper Extremity Assessment: Generalized weakness;RUE deficits/detail;LUE deficits/detail RUE Deficits / Details: A/AAROM WFLs for shoulder through digits; 2/5 MM grade and 3+/5 grip strength RUE Coordination: decreased fine motor LUE Deficits / Details: A/AAROM WFLs for shoulder through digits; 2/5 MM grade and 3+/5 grip strength LUE Coordination: decreased fine motor   Lower Extremity Assessment Lower Extremity Assessment: Generalized weakness;Defer to PT evaluation       Communication Communication Communication: Expressive difficulties(appears to understand and follow commands)   Cognition Arousal/Alertness: Lethargic Behavior During Therapy: Impulsive Overall Cognitive Status: Impaired/Different from baseline Area of Impairment: Following commands;Safety/judgement;Awareness;Attention                   Current Attention Level: Focused   Following Commands: Follows one step commands with increased time Safety/Judgement: Decreased awareness of safety;Decreased awareness of deficits Awareness: Emergent Problem Solving: Slow processing;Decreased initiation;Requires verbal cues;Requires tactile cues General Comments: Pt following 90% of commands. Pt impulsively wanting to lay back down in bed instead of perform standing task. No verbalizations throughout session.   General Comments  VSS. 103/63 (76) 65 BPM; 100% O2    Exercises     Shoulder Instructions      Home Living Family/patient expects to be discharged to:: Private residence                                 Additional Comments: Pt unable to provide information      Prior Functioning/Environment Level of Independence: Independent        Comments: Pt is a new mom         OT Problem List: Decreased strength;Decreased activity tolerance;Impaired balance (sitting and/or standing);Impaired vision/perception;Decreased coordination;Decreased cognition;Decreased safety awareness;Impaired UE functional use;Pain      OT Treatment/Interventions: Self-care/ADL training;Therapeutic exercise;Energy conservation;DME and/or AE instruction;Therapeutic activities;Visual/perceptual remediation/compensation;Patient/family education;Balance training;Cognitive remediation/compensation    OT Goals(Current goals can be found in the care plan section) Acute Rehab OT Goals Patient Stated Goal: none stated OT Goal Formulation: Patient unable to participate in goal setting Time For Goal Achievement: 09/10/19 Potential to Achieve Goals: Good ADL Goals Pt Will Perform Grooming: with supervision;standing Pt Will Perform Lower Body Dressing: with min guard assist;sitting/lateral leans;sit to/from stand Pt Will Transfer to Toilet: with min guard assist;ambulating;bedside commode Pt Will Perform Toileting - Clothing Manipulation and hygiene: with min guard assist;sitting/lateral leans;sit to/from stand Pt/caregiver will Perform Home Exercise Program: Increased strength;Both right and left upper extremity;With written HEP provided;With Supervision Additional ADL Goal #1: Pt will follow multi-step commands with minimal verbal cueing to attend to task with 100% accuracy in 3/5 trials.  OT Frequency: Min 3X/week   Barriers to D/C:            Co-evaluation              AM-PAC OT "6 Clicks" Daily Activity     Outcome Measure Help from another person eating meals?: A Lot Help from another person taking care of personal grooming?: A Lot Help from another person toileting, which includes using toliet, bedpan, or urinal?: A Lot Help from another person bathing (including washing, rinsing, drying)?: A Lot Help  from another person to put on and taking off regular upper body clothing?:  A Little Help from another person to put on and taking off regular lower body clothing?: A Lot 6 Click Score: 13   End of Session Equipment Utilized During Treatment: Gait belt Nurse Communication: Mobility status  Activity Tolerance: Patient limited by pain;Patient limited by fatigue;Treatment limited secondary to medical complications (Comment) Patient left: in bed;with call bell/phone within reach;with bed alarm set  OT Visit Diagnosis: Muscle weakness (generalized) (M62.81);Unsteadiness on feet (R26.81);Pain                Time: 8502-7741 OT Time Calculation (min): 20 min Charges:  OT General Charges $OT Visit: 1 Visit OT Evaluation $OT Eval High Complexity: 1 High  Darryl Nestle) Marsa Aris OTR/L Acute Rehabilitation Services Pager: [phone] Office: [place] 08/27/2019, 10:30 AM

## 2019-08-27 NOTE — Progress Notes (Signed)
Providing Compassionate, Quality Care - Together   Subjective: Patient not cooperative with exam. Nurse reports follow up scan completed yesterday due to patient vomiting.  Objective: Vital signs in last 24 hours: Temp:  [98.4 F (36.9 C)-99.3 F (37.4 C)] 99.2 F (37.3 C) (12/06 0800) Pulse Rate:  [45-88] 54 (12/06 0800) Resp:  [15-27] 21 (12/06 0800) BP: (95-124)/(52-81) 107/57 (12/06 0800) SpO2:  [82 %-100 %] 100 % (12/06 0800)  Intake/Output from previous day: 12/05 0701 - 12/06 0700 In: 869.9 [I.V.:169.9; IV Piggyback:700] Out: 709 [Urine:450; Drains:159] Intake/Output this shift: Total I/O In: -  Out: 5 [Drains:5]  Patient is arousable to voice PERRLA Unable to assess speech as patient would not respond MAE, Strength and sensation grossly intact EVD site clean, dry, and intact EVD at 5 Cm H20 Dressing is clean, dry, and intact  Lab Results: Recent Labs    08/25/19 0608  WBC 9.6  HGB 12.0  HCT 38.2  PLT 217   BMET Recent Labs    08/25/19 0608  NA 140  K 3.1*  CL 107  CO2 21*  GLUCOSE 146*  BUN 11  CREATININE 0.80  CALCIUM 8.7*    Studies/Results: Ct Head Wo Contrast  Result Date: 08/26/2019 CLINICAL DATA:  Follow-up intracranial hemorrhage status post evacuation of a left temporoparietal hematoma. EXAM: CT HEAD WITHOUT CONTRAST TECHNIQUE: Contiguous axial images were obtained from the base of the skull through the vertex without intravenous contrast. COMPARISON:  08/25/2019 FINDINGS: Brain: No significant change since yesterday. There has been previous left parietal craniotomy for evacuation of a temporoparietal intraparenchymal hematoma. No evidence of recurrent bleeding in that area. Small amount of blood in the left temporal lobe persists, not increased in volume since yesterday. Right ventriculostomy tube remains in place. No evidence of ventricular dilatation. Intraventricular blood nearly filling the left lateral ventricle persists, similar  in amount. Small amount of blood in the third and fourth ventricle as seen previously as well. Mild swelling with left-to-right shift of 4 mm as seen previously. No subdural collection. Vascular: No acute vascular finding. Skull: Craniotomy changes otherwise negative Sinuses/Orbits: Clear/normal Other: None IMPRESSION: Stable since yesterday. No new or worsening finding. Status post evacuation of a left temporoparietal intraparenchymal hematoma. No evidence of recurrent bleeding. Residual stable hemorrhage as described above. Mild mass effect with left-to-right shift of 4 mm. Electronically Signed   By: Paulina Fusi M.D.   On: 08/26/2019 11:38   Ct Head Wo Contrast  Result Date: 08/25/2019 CLINICAL DATA:  Intracranial hemorrhage, known, follow-up. EXAM: CT HEAD WITHOUT CONTRAST TECHNIQUE: Contiguous axial images were obtained from the base of the skull through the vertex without intravenous contrast. COMPARISON:  Head CT performed earlier the same day 08/25/2019. FINDINGS: Brain: Interval postoperative changes from interval left-sided craniotomy and left temporoparietal parenchymal hematoma evacuation. Persistent hemorrhage in the left temporal region measuring 2.6 x 1.4 x 2.7 cm, which may be within a dilated left temporal horn or parenchymal and effacing the left temporal horn (series 2, image 9). Pneumocephalus at the hematoma evacuation site. Small volume pneumocephalus also overlies the bilateral cerebral hemispheres, greatest along the anterior left frontal lobe. There is a persistent large amount of hemorrhage within the left lateral ventricle, again subtly extending into the third ventricle. There is also a small amount of intraventricular hemorrhage now present within the fourth ventricle. New right frontal approach ventricular catheter terminating in the region of the right foramen of Monro. The right lateral, third and fourth ventricles do not appear  dilated. There is a small amount of subarachnoid  hemorrhage overlying the left cerebral hemisphere. Additionally, there is a thin extradural fluid collection deep to the cranioplasty, measuring 6 mm in thickness. Persistent although decreased mass effect with 5 mm rightward midline shift at the level of the septum pellucidum (previously 8 mm). Vascular: No hyperdense vessel. Skull: New left-sided cranioplasty. Sinuses/Orbits: Visualized orbits demonstrate no acute abnormality. No significant paranasal sinus disease or mastoid effusion at the imaged levels. IMPRESSION: 1. Postoperative changes from interval left craniotomy and left temporoparietal parenchymal hematoma evacuation. Persistent hemorrhage in the left temporal region measures 2.6 x 1.4 x 2.7 cm, and may be within a dilated left temporal horn or parenchymal and effacing the left temporal. 2. Persistent large volume hemorrhage within the left lateral ventricle. Small amount of hemorrhage also present within the third and now fourth ventricles. 3. Interval placement of a right frontal approach ventricular catheter terminating near the right foramen of Monro. The right lateral, third and fourth ventricles do not appear dilated. 4. Small volume subarachnoid hemorrhage overlying the left cerebral hemisphere. 5. Decreased mass effect with interval decrease in rightward midline shift, now 5 mm. Electronically Signed   By: Kellie Simmering DO   On: 08/25/2019 16:12    Assessment/Plan: Patient with a large left ICH. She is two days status post left temporal craniotomy for hematoma evacuation and right frontal ventricular catheter placement. She was extubated on 08/25/2019.   LOS: 2 days   -Possible diagnostic cerebral angiogram with Dr. Kathyrn Sheriff this week -Trial raising drain to 15 cm H20   Viona Gilmore, DNP, AGNP-C Nurse Practitioner  [facility] [place]. [address], [place], [place] [address] P: [phone]    F: [phone]  08/27/2019, 9:34 AM

## 2019-08-27 NOTE — Progress Notes (Signed)
Rehab Admissions Coordinator Note:  Per PT and OT recommendation, this patient was screened by Raechel Ache for appropriateness for an Inpatient Acute Rehab Consult.    At this time, we are recommending Inpatient Rehab consult. AC will contact attending service to request consult order for further assessment. Please call if questions.   Raechel Ache 08/27/2019, 10:56 AM  I can be reached at [phone].

## 2019-08-27 NOTE — Progress Notes (Addendum)
STROKE TEAM PROGRESS NOTE   HISTORY OF PRESENT ILLNESS (per record) Amanda Small is a 38 y.o. female with no significant past medical history, last known normal 11 PM on 08/24/2019 when she went to bed, woke up this morning with a headache and neck pain.  Recently had a child and is still breast-feeding-delivered in August, was taking care of the child and told her mother that she cannot lift the child and was experiencing right-sided weakness.  Became confused, was noted by family to not be responding appropriately to questions.  EMS called and patient brought into the emergency room where a stat head CT was done that showed a large left temporal ICH with extension into the ventricles. Stat neurological consultation was placed, while the patient was being intubated for being unable to protect the airway.  Advised ED provider to activate code stroke. Evaluated the patient-had been intubated at the time of neurological evaluation.  Chemical paralysis used for intubation hence neurological exam was very limited. According to the triage nurse, upon arrival patient was able to say her name and was mumbling, she was moving all her extremities was definitely weaker on the right upper extremity compared to the left upper extremity. She was blinking to threat from the left but probably not so much from the right and had a leftward gaze preference.  Based on initial nursing report, GCS was around 12 Patient sister at bedside denied any knowledge of patient abusing any drugs.  She says she uses marijuana.  No history of prior strokes or bleeds in the head.  No history of heart attacks. She did not know if the patient was feeling sick prior to presentation but she called the patient's best friend to confirm if she was doing any drugs or was feeling sick and the answers to both of those questions were no. Pregnancy was unremarkable.  No history of high blood pressures.  No history of blood thinner use. Blood  pressures on arrival were systolic 90s to 100s.  Blood pressures went into the 160s after intubation.  Propofol and Cleviprex were ordered.  Heart rate was fluctuating between 40s to 90s with frequent PVCs.  LKW: 11 PM on 08/24/2019 tpa given?: no, ICH Premorbid modified Rankin scale (mRS):0   INTERVAL HISTORY Her family is not at bedside today.  Patient had an episode of vomiting yesterday, repeat CT scan of the head was stable. No more vomiting, neuro stable. Very unfortunate situation, recently had a child an dis breastfeeding, woke up with hedaache and neck pain, right-sided weakness. Was taken to OR for evacuation of ICH with IVE. CTA with no underlying vascular lesion. EVD draining. Unclear etiology of bleed.  No events overnight. She continues to be very restless overnight and sleeping during the day. She is laying in bed with the sheets over he head today. No vomiting. Very uncooperative and swearing. Needs cerebral angiogram.   OBJECTIVE Vitals:   08/27/19 0800 08/27/19 0900 08/27/19 1000 08/27/19 1100  BP: (!) 107/57  115/60 (!) 113/59  Pulse: (!) 54 61 (!) 58 (!) 50  Resp: (!) 21 (!) 23 (!) 23 (!) 21  Temp: 99.2 F (37.3 C)     TempSrc: Oral     SpO2: 100% 100% 100% 100%  Weight:      Height:        CBC:  Recent Labs  Lab 08/25/19 0608 08/27/19 0932  WBC 9.6 12.9*  NEUTROABS 5.4  --   HGB 12.0 10.2*  HCT  38.2 32.0*  MCV 94.1 93.0  PLT 217 193    Basic Metabolic Panel:  Recent Labs  Lab 08/25/19 0608 08/27/19 0932  NA 140 141  K 3.1* 3.5  CL 107 108  CO2 21* 25  GLUCOSE 146* 131*  BUN 11 7  CREATININE 0.80 0.57  CALCIUM 8.7* 9.1    Lipid Panel:     Component Value Date/Time   CHOL 211 (H) 08/25/2019 1139   TRIG 153 (H) 08/25/2019 1139   TRIG 153 (H) 08/25/2019 1139   HDL 56 08/25/2019 1139   CHOLHDL 3.8 08/25/2019 1139   VLDL 31 08/25/2019 1139   LDLCALC 124 (H) 08/25/2019 1139   HgbA1c:  Lab Results  Component Value Date   HGBA1C 4.7 (L)  08/25/2019   Urine Drug Screen:     Component Value Date/Time   LABOPIA NONE DETECTED 08/25/2019 0613   COCAINSCRNUR NONE DETECTED 08/25/2019 0613   LABBENZ NONE DETECTED 08/25/2019 0613   AMPHETMU NONE DETECTED 08/25/2019 0613   THCU POSITIVE (A) 08/25/2019 0613   LABBARB NONE DETECTED 08/25/2019 0613    Alcohol Level     Component Value Date/Time   ETH <10 08/25/2019 [id number]    IMAGING  Ct Angio Head W Or Wo Contrast Ct Angio Neck W Or Wo Contrast 08/25/2019 IMPRESSION:  1. Normal variant CTA Circle of Willis without significant proximal stenosis, aneurysm, or branch vessel occlusion. No acute or focal lesion to explain the left temporal lobe hemorrhage.  2. Normal CTA of the neck.  3. Endotracheal tube terminates 2 cm above the carina and could be pulled back 1-2 cm for more optimal positioning.   Ct Head Wo Contrast 08/25/2019 IMPRESSION:  1. Postoperative changes from interval left craniotomy and left temporoparietal parenchymal hematoma evacuation. Persistent hemorrhage in the left temporal region measures 2.6 x 1.4 x 2.7 cm, and may be within a dilated left temporal horn or parenchymal and effacing the left temporal.  2. Persistent large volume hemorrhage within the left lateral ventricle. Small amount of hemorrhage also present within the third and now fourth ventricles.  3. Interval placement of a right frontal approach ventricular catheter terminating near the right foramen of Monro. The right lateral, third and fourth ventricles do not appear dilated.  4. Small volume subarachnoid hemorrhage overlying the left cerebral hemisphere.  5. Decreased mass effect with interval decrease in rightward midline shift, now 5 mm.   Ct Head Wo Contrast 08/25/2019 IMPRESSION:  1. Large left temporal and parietal hematoma measures 7.8 x 3.3 x 3.0 cm.  2. Intraventricular extension of hemorrhage.  3. Diffuse left hemispheric mass effect with midline shift measuring 8 mm.   CT Head Wo  Contrast 08/26/2019 IMPRESSION: Stable since yesterday. No new or worsening finding. Status post evacuation of a left temporoparietal intraparenchymal hematoma.  No evidence of recurrent bleeding. Residual stable hemorrhage as described above.  Mild mass effect with left-to-right shift of 4 mm.  Dg Chest Port 1 View 08/25/2019 IMPRESSION:  ETT in good position. OG tube courses into the stomach and below the film. No acute disease.   Dg Chest Port 1 View 08/25/2019 IMPRESSION:  No acute cardiopulmonary findings.    ECG - SR rate 60 BPM. (See cardiology reading for complete details)   PHYSICAL EXAM Blood pressure (!) 113/59, pulse (!) 50, temperature 99.2 F (37.3 C), temperature source Oral, resp. rate (!) 21, height 5\' 8"  (1.727 m), weight 86.6 kg, SpO2 100 %.  Young african [place] female in NAD laying  in bed with covers over the head.agita Very ted at times, swearing. She is very purposeful bilaterally. Moving all extremities. She can adjust herself in the bed independently. PERRL, EOMI, blinks to threat on both sides, left eye swollen, corneal reflexes intact, uvula midline, voice and hearing intact, face symmetric, uncooperative but answers questions and follows commands when she wants to, withdraws x 4, purposeful x 4,    ASSESSMENT/PLAN Amanda Small is a 38 y.o. female with history of recent pregnancy with delivery in August presenting with HA, AMS, right sided weakness, and left gaze preference. She did not receive IV t-PA due to hemorrhage. Left temporal craniotomy and placement of EVD catheter 08/25/19. Will need cerebral angiogram at some point - Dr Kathyrn Sheriff.  Stroke: Large left temporal and parietal hematoma unknown etiology recently had a child still breastfeeding  Code Stroke CT Head - not ordered  CT head - 08/25/19 - Large left temporal and parietal hematoma measures 7.8 x 3.3 x 3.0 cm. Intraventricular extension of hemorrhage. Diffuse left hemispheric mass  effect with midline shift measuring 8 mm.   Repeat CTH - 08/25/19 - Postoperative changes from interval left craniotomy and left temporoparietal parenchymal hematoma evacuation. Persistent hemorrhage in the left temporal region measures 2.6 x 1.4 x 2.7 cm, and may be within a dilated left temporal horn or parenchymal and effacing the left temporal. Persistent large volume hemorrhage within the left lateral ventricle. Small amount of hemorrhage also present within the third and now fourth ventricles. The right lateral, third and fourth ventricles do not appear dilated. Small volume subarachnoid hemorrhage overlying the left cerebral hemisphere. Decreased mass effect with interval decrease in rightward midline shift, now 5 mm.  [place] - 08/26/19 - Stable since yesterday. No new or worsening finding. Status post evacuation of a left temporoparietal intraparenchymal hematoma. No evidence of recurrent bleeding. Residual stable hemorrhage as described above. Mild mass effect with left-to-right shift of 4 mm.  MRI head - not ordered  MRA head - not ordered  CTA H&N - unremarkable   CT Perfusion - not ordered  Carotid Doppler - CTA neck performed - carotid dopplers not indicated.  2D Echo - not ordered  [employer] Virus 2 - negative  LDL - 124  HgbA1c - 4.7  UDS - THCU  VTE prophylaxis - SCDs Diet  Diet Order            Diet NPO time specified  Diet effective now              No antithrombotic prior to admission, now on No antithrombotic  Ongoing aggressive stroke risk factor management  Therapy recommendations:  pending  Disposition:  Pending  Left temporal craniotomy and placement of EVD catheter 08/25/19 - Dr Christella Noa  Will need cerebral angiogram at some point - Dr Kathyrn Sheriff.  Now on Keppra  Hypertension  Home BP meds: none   Current BP meds: Labetalol prn -> change to hydralazine prn SBP > 160 (national shortage - supplies critically low)  Blood pressure runs low - SBP  ~ 100  SBP goal < 160 mm Hg . Long-term BP goal normotensive  Hyperlipidemia  Home Lipid lowering medication: none  LDL 124, goal < 70   Current lipid lowering medication: None (statin contraindicated with ICH)  Consider statin at discharge  Other Stroke Risk Factors Obesity, Body mass index is 29.03 kg/m., recommend weight loss, diet and exercise as appropriate   Other Active Problems  Recent pregnancy with delivery in August  Temp 99.1->98.9 - IV Ancef started 08/25/19 - CBC - pending  Mild bradycardia - 50's - 60's  Hypokalemia - 3.1 - supplement and recheck - pending  Bradycardia - 30's - 08/26/19 - 9:37 pm - atropine per eLink - currently not on meds that should cause bradycardia except prn Labetalol - will change to hydralazine - monitor - may need cardiology consult if it continues  CCM following as well   Hospital day # 2  This patient is critically ill and at significant risk of neurological worsening, death and care requires constant monitoring of vital signs, hemodynamics,respiratory and cardiac monitoring,review of multiple databases, neurological assessment, discussion with family, other specialists and medical decision making of high complexity.I  I spent 30 minutes of neurocritical care time in the care of this patient.  Personally examined patient and images, and have participated in and made any corrections needed to history, physical, neuro exam,assessment and plan as stated above.  I have personally obtained the history, evaluated lab date, reviewed imaging studies and agree with radiology interpretations.    Naomie Dean, MD Stroke Neurology     To contact Stroke Continuity provider, please refer to [email or web address]. After hours, contact General Neurology

## 2019-08-27 NOTE — Evaluation (Signed)
Clinical/Bedside Swallow Evaluation Patient Details  Name: Amanda Small MRN: [id number] Date of Birth: 07/26/1981  Today's Date: 08/27/2019 Time: SLP Start Time (ACUTE ONLY): 0755 SLP Stop Time (ACUTE ONLY): 0821 SLP Time Calculation (min) (ACUTE ONLY): 26 min  Past Medical History: History reviewed. No pertinent past medical history. Past Surgical History: History reviewed. No pertinent surgical history. HPI:  Amanda Small is a 38 y.o. female with no significant past medical history, last known normal 11 PM on 08/24/2019 when she went to bed, woke up this morning with a headache and neck pain.  Recently had a child and is still breast-feeding.  She delivered in August.  She was taking care of the child and told her mother that she could not lift the child and was experiencing right-sided weakness.  Became confused, was noted by family to not be responding appropriately to questions.  EMS called and patient brought into the emergency room where a stat head CT was done that showed a large left temporal ICH with extension into the ventricles.  Stat neurological consultation was placed, while the patient was being intubated for being unable to protect the airway.  Advised ED provider to activate code stroke.  Evaluated the patient-had been intubated at the time of neurological evaluation.  Chemical paralysis used for intubation hence neurological exam was very limited.  According to the triage nurse, upon arrival patient was able to say her name and was mumbling, she was moving all her extremities but was definitely weaker on the right upper extremity compared to the left upper extremity.  She was blinking to threat from the left but probably not so much from the right and had a leftward gaze preference.  Based on initial nursing report, GCS was around 12.  She is s/p craniotomy with ventriculostomy in place.  It was clamped off by MD for evaluation but will continue to be running.  Initial head CT  was showing the following:  large left temporal and parietal hematoma measures 7.8 x 3.3 x3.0 cm. intraventricular extension of hemorrhage, diffuse left hemispheric mass effect with midline shift measuring   Assessment / Plan / Recommendation Clinical Impression  Clinical swallowing evaluation was completed using ice chips, thin liquids via spoon, cup and straw, pureed mateial and dry solids.  She was noted to keep her eye closed for most of evaluation.  It was difficult to tell if she was lethargic.  Intermittently should would open her eyes and look at therapist.  She has a ventriculostomy in place that the MD clamped off for evaluation.  Cranial nerve exam was attempted and unable to be completed.  The patient was either unable or unwilling to follow commands.  She did not verbalize throughout entire evaluation although RN reported she will speak when she dislikes something.  She presented with a possible oropharyngeal dysphagia.  Anterior escape was seen given thin liquids.  She appeared to have possible issues creating enough intraoral pressure to properly use a straw.  She appeared to masticate ice chips well but refused dry solid material.  Swallow trigger was appreciated to palpation and overt s/s of aspiration were not seen.  Given clinical presentation recommend she be allowed to have ice chips PRN for oral dryness and medications crushed in pureed material but otherwise remain NPO.  ST will follow up next date to assess for possibility for a PO diet.   SLP Visit Diagnosis: Dysphagia, unspecified (R13.10)    Aspiration Risk  Mild aspiration risk  Diet Recommendation   NPO except meds crushed in pureed material and ice chips PRN with aggressive oral care at leasdt 3x/day.  Medication Administration: Crushed with puree    Other  Recommendations Oral Care Recommendations: Oral care QID   Follow up Recommendations Other (comment)(TBD)      Frequency and Duration min 2x/week  2 weeks        Prognosis Prognosis for Safe Diet Advancement: Good      Swallow Study   General Date of Onset: 08/25/19 HPI: Amanda Small is a 38 y.o. female with no significant past medical history, last known normal 11 PM on 08/24/2019 when she went to bed, woke up this morning with a headache and neck pain.  Recently had a child and is still breast-feeding.  She delivered in August.  She was taking care of the child and told her mother that she could not lift the child and was experiencing right-sided weakness.  Became confused, was noted by family to not be responding appropriately to questions.  EMS called and patient brought into the emergency room where a stat head CT was done that showed a large left temporal ICH with extension into the ventricles.  Stat neurological consultation was placed, while the patient was being intubated for being unable to protect the airway.  Advised ED provider to activate code stroke.  Evaluated the patient-had been intubated at the time of neurological evaluation.  Chemical paralysis used for intubation hence neurological exam was very limited.  According to the triage nurse, upon arrival patient was able to say her name and was mumbling, she was moving all her extremities but was definitely weaker on the right upper extremity compared to the left upper extremity.  She was blinking to threat from the left but probably not so much from the right and had a leftward gaze preference.  Based on initial nursing report, GCS was around 12.  She is s/p craniotomy with ventriculostomy in place.  It was clamped off by MD for evaluation but will continue to be running.  Initial head CT was showing the following:  large left temporal and parietal hematoma measures 7.8 x 3.3 x3.0 cm. intraventricular extension of hemorrhage, diffuse left hemispheric mass effect with midline shift measuring Type of Study: Bedside Swallow Evaluation Previous Swallow Assessment: None noted at [facility]. Diet Prior  to this Study: NPO Temperature Spikes Noted: No Respiratory Status: Room air History of Recent Intubation: Yes Length of Intubations (days): (5 hours) Date extubated: 08/25/19 Behavior/Cognition: Doesn't follow directions;Lethargic/Drowsy Oral Cavity Assessment: Dry Oral Care Completed by SLP: Yes Oral Cavity - Dentition: Adequate natural dentition Vision: Impaired for self-feeding Self-Feeding Abilities: Total assist Patient Positioning: Upright in bed Baseline Vocal Quality: Not observed Volitional Cough: Cognitively unable to elicit Volitional Swallow: Unable to elicit    Oral/Motor/Sensory Function Overall Oral Motor/Sensory Function: Other (comment)(CNT)   Ice Chips Ice chips: Within functional limits Presentation: Spoon   Thin Liquid Thin Liquid: Impaired Presentation: Spoon;Cup;Straw Oral Phase Impairments: Poor awareness of bolus;Reduced labial seal;Reduced lingual movement/coordination Oral Phase Functional Implications: Right anterior spillage;Left anterior spillage    Nectar Thick Nectar Thick Liquid: Not tested   Honey Thick Honey Thick Liquid: Not tested   Puree Puree: Within functional limits Presentation: Spoon   Solid     Solid: (Pt refused texture.)     Shelly Flatten, MA, CCC-SLP Acute Rehab SLP [phone]  Lamar Sprinkles 08/27/2019,8:32 AM

## 2019-08-27 NOTE — Evaluation (Addendum)
Physical Therapy Evaluation Patient Details Name: Amanda Small MRN: [id number] DOB: [birth year] Today's Date: 08/27/2019   History of Present Illness  Pt is a 38 year old woman with unheralded onset of headache and inability to use her right side. CT revealed large left temporal ICH with intraventricular extension. Pt was intubated for airway protection. PT s/p left temporal craniotomy for hematoma evacuation and right frontal ventricular catheter placement. No pertinent history.  Clinical Impression  Pt admitted with above. Prior to admission, pt living at home as a new mom independently. Pt keeping eyes closed for majority of session and not verbalizing. However, following simple commands ~90% of the time. Requiring min assist for bed mobility; able to don socks at edge of bed with min guard assist. Attempted to stand but pt impulsively lying back down in bed. BP 103/63, HR 49-55 bpm. Limited by decreased cognition, impulsivity, weakness, and decreased coordination. Will continue to follow acutely to progress mobility as tolerated.    Follow Up Recommendations CIR;Supervision/Assistance - 24 hour    Equipment Recommendations  Other (comment)(TBA)    Recommendations for Other Services Rehab consult     Precautions / Restrictions Precautions Precautions: Fall;Other (comment) Precaution Comments: EVD requires clamp Restrictions Weight Bearing Restrictions: No      Mobility  Bed Mobility Overal bed mobility: Needs Assistance Bed Mobility: Rolling;Sidelying to Sit;Sit to Sidelying Rolling: Min assist Sidelying to sit: Min assist     Sit to sidelying: Min assist General bed mobility comments: minA overall for trunk stability and facilitating movement as pt not wanting to open eyes  Transfers Overall transfer level: Needs assistance               General transfer comment: deferred as pt impulsively raising BLEs back on bed and attempting to lie back  down.  Ambulation/Gait                Stairs            Wheelchair Mobility    Modified Rankin (Stroke Patients Only) Modified Rankin (Stroke Patients Only) Pre-Morbid Rankin Score: No symptoms Modified Rankin: Moderately severe disability     Balance Overall balance assessment: Needs assistance Sitting-balance support: Bilateral upper extremity supported Sitting balance-Leahy Scale: Poor Sitting balance - Comments: minguardA continuously Postural control: Posterior lean                                   Pertinent Vitals/Pain Pain Assessment: Faces Faces Pain Scale: No hurt    Home Living Family/patient expects to be discharged to:: Private residence                 Additional Comments: Pt unable to provide information    Prior Function Level of Independence: Independent         Comments: Pt is a new mom     Hand Dominance   Dominant Hand: Right    Extremity/Trunk Assessment   Upper Extremity Assessment Upper Extremity Assessment: Defer to OT evaluation RUE Deficits / Details: A/AAROM WFLs for shoulder through digits; 2/5 MM grade and 3+/5 grip strength RUE Coordination: decreased fine motor LUE Deficits / Details: A/AAROM WFLs for shoulder through digits; 2/5 MM grade and 3+/5 grip strength LUE Coordination: decreased fine motor    Lower Extremity Assessment Lower Extremity Assessment: Overall WFL for tasks assessed(moving spontaneously)    Cervical / Trunk Assessment Cervical / Trunk Assessment: Normal  Communication   Communication:  Expressive difficulties(appears to understand and follow commands)  Cognition Arousal/Alertness: Lethargic Behavior During Therapy: Impulsive Overall Cognitive Status: Impaired/Different from baseline Area of Impairment: Following commands;Safety/judgement;Awareness;Attention                   Current Attention Level: Focused   Following Commands: Follows one step commands  with increased time Safety/Judgement: Decreased awareness of safety;Decreased awareness of deficits Awareness: Emergent Problem Solving: Slow processing;Decreased initiation;Requires verbal cues;Requires tactile cues General Comments: Pt following 90% of commands. Pt impulsively wanting to lay back down in bed instead of perform standing task. No verbalizations or head nods throughout session. Keeping eyes closed through majority of session.      General Comments General comments (skin integrity, edema, etc.): VSS. 103/63 (76) 65 BPM; 100% O2    Exercises     Assessment/Plan    PT Assessment Patient needs continued PT services  PT Problem List Decreased strength;Decreased activity tolerance;Decreased balance;Decreased mobility;Decreased cognition;Decreased safety awareness       PT Treatment Interventions Gait training;Stair training;Functional mobility training;Therapeutic activities;Therapeutic exercise;Balance training;Patient/family education;Cognitive remediation    PT Goals (Current goals can be found in the Care Plan section)  Acute Rehab PT Goals Patient Stated Goal: none stated PT Goal Formulation: Patient unable to participate in goal setting Time For Goal Achievement: 09/10/19 Potential to Achieve Goals: Good    Frequency Min 4X/week   Barriers to discharge        Co-evaluation               AM-PAC PT "6 Clicks" Mobility  Outcome Measure Help needed turning from your back to your side while in a flat bed without using bedrails?: A Little Help needed moving from lying on your back to sitting on the side of a flat bed without using bedrails?: A Little Help needed moving to and from a bed to a chair (including a wheelchair)?: A Lot Help needed standing up from a chair using your arms (e.g., wheelchair or bedside chair)?: A Lot Help needed to walk in hospital room?: A Lot Help needed climbing 3-5 steps with a railing? : Total 6 Click Score: 13    End of  Session   Activity Tolerance: Patient limited by fatigue Patient left: in bed;with call bell/phone within reach;with bed alarm set Nurse Communication: Mobility status PT Visit Diagnosis: Other abnormalities of gait and mobility (R26.89);Difficulty in walking, not elsewhere classified (R26.2);Other symptoms and signs involving the nervous system (R29.898)    Time: 2423-5361 PT Time Calculation (min) (ACUTE ONLY): 17 min   Charges:   PT Evaluation $PT Eval High Complexity: 1 High          Ellamae Sia, [place], DPT Acute Rehabilitation Services Pager [phone] Office [phone]   Willy Eddy 08/27/2019, 10:53 AM

## 2019-08-28 ENCOUNTER — Encounter (HOSPITAL_COMMUNITY): Payer: Self-pay | Admitting: Neurosurgery

## 2019-08-28 ENCOUNTER — Inpatient Hospital Stay (HOSPITAL_COMMUNITY): Payer: 59

## 2019-08-28 DIAGNOSIS — I611 Nontraumatic intracerebral hemorrhage in hemisphere, cortical: Secondary | ICD-10-CM

## 2019-08-28 DIAGNOSIS — I6389 Other cerebral infarction: Secondary | ICD-10-CM

## 2019-08-28 DIAGNOSIS — G936 Cerebral edema: Secondary | ICD-10-CM | POA: Diagnosis present

## 2019-08-28 DIAGNOSIS — I639 Cerebral infarction, unspecified: Secondary | ICD-10-CM | POA: Diagnosis present

## 2019-08-28 DIAGNOSIS — G911 Obstructive hydrocephalus: Secondary | ICD-10-CM | POA: Diagnosis present

## 2019-08-28 LAB — BASIC METABOLIC PANEL
Anion gap: 10 (ref 5–15)
BUN: 11 mg/dL (ref 6–20)
CO2: 25 mmol/L (ref 22–32)
Calcium: 9.1 mg/dL (ref 8.9–10.3)
Chloride: 110 mmol/L (ref 98–111)
Creatinine, Ser: 0.74 mg/dL (ref 0.44–1.00)
GFR calc Af Amer: >60 mL/min (ref >60)
GFR calc non Af Amer: >60 mL/min (ref >60)
Glucose, Bld: 100 mg/dL — ABNORMAL HIGH (ref 70–99)
Potassium: 3.4 mmol/L — ABNORMAL LOW (ref 3.5–5.1)
Sodium: 145 mmol/L (ref 135–145)

## 2019-08-28 LAB — CBC
HCT: 32.4 % — ABNORMAL LOW (ref 36.0–46.0)
Hemoglobin: 10.1 g/dL — ABNORMAL LOW (ref 12.0–15.0)
MCH: 29.2 pg (ref 26.0–34.0)
MCHC: 31.2 g/dL (ref 30.0–36.0)
MCV: 93.6 fL (ref 80.0–100.0)
Platelets: 192 10*3/uL (ref 150–400)
RBC: 3.46 MIL/uL — ABNORMAL LOW (ref 3.87–5.11)
RDW: 13.1 % (ref 11.5–15.5)
WBC: 9.4 10*3/uL (ref 4.0–10.5)
nRBC: 0 % (ref 0.0–0.2)

## 2019-08-28 LAB — ECHOCARDIOGRAM COMPLETE
Height: 68 in
Weight: 3054.69 oz

## 2019-08-28 MED ORDER — SODIUM CHLORIDE 0.9 % IV SOLN
INTRAVENOUS | Status: DC
  Administered 2019-08-29 – 2019-08-31 (×3): via INTRAVENOUS

## 2019-08-28 MED ORDER — JEVITY 1.2 CAL PO LIQD
1000.0000 mL | ORAL | Status: DC
  Filled 2019-08-28: qty 1000

## 2019-08-28 MED ORDER — POTASSIUM CHLORIDE CRYS ER 20 MEQ PO TBCR
40.0000 meq | EXTENDED_RELEASE_TABLET | Freq: Once | ORAL | Status: AC
  Administered 2019-08-28: 40 meq via ORAL
  Filled 2019-08-28: qty 2

## 2019-08-28 MED FILL — Thrombin For Soln 5000 Unit: CUTANEOUS | Qty: 5000 | Status: AC

## 2019-08-28 MED FILL — Gelatin Absorbable MT Powder: OROMUCOSAL | Qty: 1 | Status: AC

## 2019-08-28 NOTE — Progress Notes (Signed)
Inpatient Rehab Admissions:  Inpatient Rehab Consult received.  I met with patient and her sister at the bedside for rehabilitation assessment and to discuss goals and expectations of an inpatient rehab admission.  They are open to CIR if needed and family can provide recommended 24/7 supervision.  Feel pt currently an excellent candidate for CIR, but do note that she is improving very rapidly with regards to her functional mobility.  She is not currently medically ready for CIR, so I will follow along.    Signed: Shann Medal, PT, DPT Admissions Coordinator [phone] 08/28/19  3:15 PM

## 2019-08-28 NOTE — TOC Initial Note (Addendum)
Transition of Care [facility]) - Initial/Assessment Note    Patient Details  Name: Amanda Small MRN: [id number] Date of Birth: 11-08-1980  Transition of Care [facility]) CM/SW Contact:    Carles Collet, RN Phone Number: 08/28/2019, 1:06 PM  Clinical Narrative:               KABRINA CHRISTIANO is a 38 y.o. female with no significant past medical history admitted with right sided weakness and confusion. CT was done that showed a large left temporal ICH with extension into the ventricles. Per chart, pt gave birth August 2020.  She is s/p craniotomy with ventriculostomy.  Patient is admitted from home, social history is difficult to ascertain from notes. Per notes she had a baby in Aug, no OBGYN or delivery records in chart review or care everywhere. [place] address. Sister is listed contact, LVM requesting callback.  Insured, no PCP on file, nor are there PCP appointments in chart review.   Currently under review for potential CIR admission, pending progress, insurance, and bed availability. Otherwise would probably be HH and not SNF due to 62 month old at home.     Newton Pigg Sister   [phone]   Received call back from sister, Aldona Bar. Jalei moved from [place] in April to [place] and lives at her mother's house with her 78 year old daughter, and 43 month old baby. 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 Confirmed backup for CIR would be HH.    Expected Discharge Plan: IP Rehab Facility(pending insurance and bed availability, otherwise HH) Barriers to Discharge: Continued Medical Work up   Patient Goals and CMS Choice        Expected Discharge Plan and Services Expected Discharge Plan: IP Rehab Facility(pending insurance and bed availability, otherwise HH)                                              Prior Living Arrangements/Services                       Activities of Daily Living      Permission  Sought/Granted                  Emotional Assessment              Admission diagnosis:  IVH (intraventricular hemorrhage) ([place]) [I61.5] Left-sided nontraumatic intracerebral hemorrhage, unspecified cerebral location [facility]) [I61.9] Patient Active Problem List   Diagnosis Date Noted  . ICH (intracerebral hemorrhage) ([employer]) 08/25/2019   PCP:  Patient, No Pcp Per Pharmacy:   [facility] [place], [place] [address] [place] [address] Phone: [phone] Fax: [phone]  [facility] [place], [place] [address] [place] [address] Phone: [phone] Fax: [phone]     Social Determinants of Health (SDOH) Interventions    Readmission Risk Interventions No flowsheet data found.

## 2019-08-28 NOTE — Progress Notes (Signed)
Assisted pt to pump using machine, got 25 mLtotal, put in fridge.

## 2019-08-28 NOTE — Evaluation (Signed)
Speech Language Pathology Evaluation Patient Details Name: Amanda Small MRN: [id number] DOB: 24-Aug-1981 Today's Date: 08/28/2019 Time: 3875-6433 SLP Time Calculation (min) (ACUTE ONLY): 7 min  Problem List:  Patient Active Problem List   Diagnosis Date Noted  . Cytotoxic brain edema ([place]) 08/28/2019  . Obstructive hydrocephalus ([place]) 08/28/2019  . Aphasia due to acute stroke ([place]) 08/28/2019  . ICH (intracerebral hemorrhage) ([place]) 08/25/2019   Past Medical History: History reviewed. No pertinent past medical history. Past Surgical History:  Past Surgical History:  Procedure Laterality Date  . CRANIOTOMY Left 08/25/2019   Procedure: LEFT CRANIECTOMY;  Surgeon: [name], MD;  Location: [place];  Service: Neurosurgery;  Laterality: Left;  Marland Kitchen VENTRICULOSTOMY Right 08/25/2019   Procedure: RIGHT FRONTAL VENTRICULAR CATHETER;  Surgeon: [name], MD;  Location: [place];  Service: Neurosurgery;  Laterality: Right;   HPI:  Amanda Small is a 38 y.o. female with no significant past medical history admitted with right sided weakness and confusion. CT was done that showed a large left temporal ICH with extension into the ventricles. Per chart, pt gave birth August 2020.  She is s/p craniotomy with ventriculostomy.    Assessment / Plan / Recommendation Clinical Impression  Pt's left sided hemorrhage affected her comprehension and expression. She exhibited difficulty comprehending verbal and written simple commands. She gestured with shoulder shrugs in response to biographical/environmental yes/no questions. Single word responses were mostly filler words such as "yeah, no um, maybe." She did utter a phrase with PT "getting over there?". Appeared to have decreased awareness of errors. Will further evaluate cognitive abilities in diagnostic treatment. Recommend inpatient rehab. Will work with pt on acute to maximize language abilities.        SLP Assessment  SLP Recommendation/Assessment:  Patient needs continued Speech Lanaguage Pathology Services SLP Visit Diagnosis: Dysphagia, unspecified (R13.10)    Follow Up Recommendations  Inpatient Rehab    Frequency and Duration min 2x/week  2 weeks      SLP Evaluation Cognition  Overall Cognitive Status: Impaired/Different from baseline Arousal/Alertness: Awake/alert Orientation Level: (shrugs shoulders to many y/n questions) Attention: Sustained Sustained Attention: Impaired Sustained Attention Impairment: Verbal basic Awareness: Impaired Awareness Impairment: Emergent impairment Problem Solving: (will continue to evaluate ) Safety/Judgment: Impaired       Comprehension  Auditory Comprehension Overall Auditory Comprehension: Impaired Yes/No Questions: Impaired Basic Biographical Questions: 0-25% accurate Commands: Impaired One Step Basic Commands: 0-24% accurate Visual Recognition/Discrimination Discrimination: Not tested Reading Comprehension Reading Status: Impaired Word level: Impaired    Expression Expression Primary Mode of Expression: Verbal(and gestures) Verbal Expression Overall Verbal Expression: Impaired Initiation: Impaired Level of Generative/Spontaneous Verbalization: Word(phrase length z 1 ) Repetition: Impaired Level of Impairment: Word level Naming: Impairment Confrontation: Impaired Pragmatics: No impairment Written Expression Dominant Hand: Right Written Expression: (TBA)   Oral / Motor  Oral Motor/Sensory Function Overall Oral Motor/Sensory Function: Other (comment)(no significant impairments) Motor Speech Overall Motor Speech: Appears within functional limits for tasks assessed Respiration: Within functional limits Phonation: Normal Resonance: Within functional limits Articulation: Within functional limitis Intelligibility: Intelligible Motor Planning: Witnin functional limits   GO                    Houston Siren 08/28/2019, 3:48 PM   Orbie Pyo Zekiel Torian M.Ed  Risk analyst [phone] Office [phone]

## 2019-08-28 NOTE — Consult Note (Signed)
Physical Medicine and Rehabilitation Consult Reason for Consult: Right side weakness Referring Physician: Dr. Pearlean BrownieSethi   HPI: Amanda Small is a 38 y.o. right-handed female with unremarkable past medical history on no prescription medications.  Presented 08/25/2019 with headache and neck pain.  Patient recently had a child was still breast-feeding delivered in August.  Patient became confused not answering questions appropriately.  Cranial CT scan showed large left temporal and parietal hematoma measuring 7.8 x 3.3 x 3.0 cm.  Intraventricular extension of hemorrhage.  Diffuse left hemispheric mass-effect with midline shift measuring 8 mm.  CT angiogram of head and neck no acute or focal lesion noted.  Normal CTA of neck.  Admission chemistries with potassium 3.1, ammonia level 43, alcohol negative, total CK within normal limits 102, urine drug screen positive marijuana.  Neurosurgery Dr. Coletta MemosKyle Cabbell consulted and patient underwent left temporal craniotomy for evacuation of hematoma right frontal ventricular catheter placement 08/25/2019.Marland Kitchen.  Patient did initially require intubation for airway protection.  Started on Cleviprex for blood pressure control.  Maintained on Keppra for seizure prophylaxis.  Await plan for neurosurgery possible diagnostic cerebral angiogram to be completed.  Therapy evaluations completed recommendations of physical medicine rehab consult.   Review of Systems  Unable to perform ROS: Acuity of condition   History reviewed. No pertinent past medical history. History reviewed. No pertinent surgical history. History reviewed. No pertinent family history. Social History:  has no history on file for tobacco, alcohol, and drug. Allergies: No Known Allergies Medications Prior to Admission  Medication Sig Dispense Refill  . cyclobenzaprine (FLEXERIL) 10 MG tablet Take 10 mg by mouth 3 (three) times daily.    . meloxicam (MOBIC) 15 MG tablet Take 15 mg by mouth daily.       Home: Home Living Family/patient expects to be discharged to:: Private residence Additional Comments: Pt unable to provide information  Functional History: Prior Function Level of Independence: Independent Comments: Pt is a new mom Functional Status:  Mobility: Bed Mobility Overal bed mobility: Needs Assistance Bed Mobility: Rolling, Sidelying to Sit, Sit to Sidelying Rolling: Min assist Sidelying to sit: Min assist Sit to sidelying: Min assist General bed mobility comments: minA overall for trunk stability and facilitating movement as pt not wanting to open eyes Transfers Overall transfer level: Needs assistance General transfer comment: deferred as pt impulsively raising BLEs back on bed and attempting to lie back down.      ADL: ADL Overall ADL's : Needs assistance/impaired Grooming: Minimal assistance Grooming Details (indicate cue type and reason): hand over hand assist; eventually washing face without assist Upper Body Bathing: Minimal assistance, Sitting Lower Body Bathing: Minimal assistance, Sitting/lateral leans, Sit to/from stand, Cueing for safety, Cueing for sequencing Upper Body Dressing : Minimal assistance, Sitting Lower Body Dressing: Minimal assistance, Sit to/from stand, Sitting/lateral leans, Cueing for safety, +2 for physical assistance, +2 for safety/equipment Lower Body Dressing Details (indicate cue type and reason): Pt seated EOB for sock donning task- pt leaning over to apply socks with minguardA for sitting balance Toilet Transfer Details (indicate cue type and reason): deferred as pt trying to get back in bed. Toileting- Clothing Manipulation and Hygiene: Minimal assistance, Moderate assistance, Cueing for safety, Cueing for sequencing, +2 for safety/equipment, +2 for physical assistance, Sitting/lateral lean, Sit to/from stand Functional mobility during ADLs: Minimal assistance, Moderate assistance, +2 for physical assistance, +2 for  safety/equipment, Cueing for safety, Cueing for sequencing General ADL Comments: Pt limited by impulsivity, weakness, poor coordination and  increased assist required for ADL and mobility.   Cognition: Cognition Overall Cognitive Status: Impaired/Different from baseline Orientation Level: Other (comment)(uta) Cognition Arousal/Alertness: Lethargic Behavior During Therapy: Impulsive Overall Cognitive Status: Impaired/Different from baseline Area of Impairment: Following commands, Safety/judgement, Awareness, Attention Current Attention Level: Focused Following Commands: Follows one step commands with increased time Safety/Judgement: Decreased awareness of safety, Decreased awareness of deficits Awareness: Emergent Problem Solving: Slow processing, Decreased initiation, Requires verbal cues, Requires tactile cues General Comments: Pt following 90% of commands. Pt impulsively wanting to lay back down in bed instead of perform standing task. No verbalizations or head nods throughout session. Keeping eyes closed through majority of session.  Blood pressure 109/63, pulse (!) 52, temperature 98.3 F (36.8 C), temperature source Oral, resp. rate (!) 22, height 5\' 8"  (1.727 m), weight 86.6 kg, SpO2 99 %.   Physical Exam Gen: lethargic HEENT: oral mucosa pink and moist, eyes closed Cardio: Reg rate Chest: normal effort, normal rate of breathing Abd: soft, non-distended Ext: no edema Skin: intact Neuro/Musculoskeletal: Unable to engage in exam due to lethargy/cognitive deficits Psych: eyes closed, not verbalizing  Results for orders placed or performed during the hospital encounter of 08/25/19 (from the past 24 hour(s))  CBC     Status: Abnormal   Collection Time: 08/27/19  9:32 AM  Result Value Ref Range   WBC 12.9 (H) 4.0 - 10.5 K/uL   RBC 3.44 (L) 3.87 - 5.11 MIL/uL   Hemoglobin 10.2 (L) 12.0 - 15.0 g/dL   HCT 32.0 (L) 36.0 - 46.0 %   MCV 93.0 80.0 - 100.0 fL   MCH 29.7 26.0 - 34.0  pg   MCHC 31.9 30.0 - 36.0 g/dL   RDW 13.2 11.5 - 15.5 %   Platelets 193 150 - 400 K/uL   nRBC 0.0 0.0 - 0.2 %  Basic metabolic panel     Status: Abnormal   Collection Time: 08/27/19  9:32 AM  Result Value Ref Range   Sodium 141 135 - 145 mmol/L   Potassium 3.5 3.5 - 5.1 mmol/L   Chloride 108 98 - 111 mmol/L   CO2 25 22 - 32 mmol/L   Glucose, Bld 131 (H) 70 - 99 mg/dL   BUN 7 6 - 20 mg/dL   Creatinine, Ser 0.57 0.44 - 1.00 mg/dL   Calcium 9.1 8.9 - 10.3 mg/dL   GFR calc non Af Amer >60 >60 mL/min   GFR calc Af Amer >60 >60 mL/min   Anion gap 8 5 - 15   Ct Head Wo Contrast  Result Date: 08/26/2019 CLINICAL DATA:  Follow-up intracranial hemorrhage status post evacuation of a left temporoparietal hematoma. EXAM: CT HEAD WITHOUT CONTRAST TECHNIQUE: Contiguous axial images were obtained from the base of the skull through the vertex without intravenous contrast. COMPARISON:  08/25/2019 FINDINGS: Brain: No significant change since yesterday. There has been previous left parietal craniotomy for evacuation of a temporoparietal intraparenchymal hematoma. No evidence of recurrent bleeding in that area. Small amount of blood in the left temporal lobe persists, not increased in volume since yesterday. Right ventriculostomy tube remains in place. No evidence of ventricular dilatation. Intraventricular blood nearly filling the left lateral ventricle persists, similar in amount. Small amount of blood in the third and fourth ventricle as seen previously as well. Mild swelling with left-to-right shift of 4 mm as seen previously. No subdural collection. Vascular: No acute vascular finding. Skull: Craniotomy changes otherwise negative Sinuses/Orbits: Clear/normal Other: None IMPRESSION: Stable since yesterday. No new or worsening  finding. Status post evacuation of a left temporoparietal intraparenchymal hematoma. No evidence of recurrent bleeding. Residual stable hemorrhage as described above. Mild mass effect  with left-to-right shift of 4 mm. Electronically Signed   By: Paulina Fusi M.D.   On: 08/26/2019 11:38     Assessment/Plan: Diagnosis: Left temporal ICH s/p craniotomy 1. Does the need for close, 24 hr/day medical supervision in concert with the patient's rehab needs make it unreasonable for this patient to be served in a less intensive setting? Yes 2. Co-Morbidities requiring supervision/potential complications: overweight 3. Due to bladder management, bowel management, safety, skin/wound care, disease management, medication administration, pain management and patient education, does the patient require 24 hr/day rehab nursing? Yes 4. Does the patient require coordinated care of a physician, rehab nurse, therapy disciplines of PT, OT, SLP to address physical and functional deficits in the context of the above medical diagnosis(es)? Yes Addressing deficits in the following areas: balance, endurance, locomotion, strength, transferring, bowel/bladder control, bathing, dressing, feeding, grooming, toileting, cognition, speech, language, swallowing and psychosocial support 5. Can the patient actively participate in an intensive therapy program of at least 3 hrs of therapy per day at least 5 days per week? Yes 6. The potential for patient to make measurable gains while on inpatient rehab is good 7. Anticipated functional outcomes upon discharge from inpatient rehab are supervision with PT, supervision with OT, supervision with SLP. 8. Estimated rehab length of stay to reach the above functional goals is: 10-14 days. 9. Anticipated discharge destination: Home 10. Overall Rehab/Functional Prognosis: good  RECOMMENDATIONS: This patient's condition is appropriate for continued rehabilitative care in the following setting: CIR Patient has agreed to participate in recommended program. Yes Note that insurance prior authorization may be required for reimbursement for recommended care.  Comment: Patient  would be a good candidate for CIR once lethargy improves. She will need 24 hour supervision upon discharge due to her cognitive deficits.   Mcarthur Rossetti Angiulli, PA-C 08/28/2019   I have personally performed a face to face diagnostic evaluation, including, but not limited to relevant history and physical exam findings, of this patient and developed relevant assessment and plan.  Additionally, I have reviewed and concur with the physician assistant's documentation above.  Sula Soda, MD

## 2019-08-28 NOTE — Progress Notes (Signed)
Patient ID: Amanda Small, female   DOB: 10/02/1980, 38 y.o.   MRN: [id number] BP 125/74   Pulse (!) 44   Temp 98.3 F (36.8 C) (Oral)   Resp 17   Ht 5\' 8"  (1.727 m)   Wt 86.6 kg   SpO2 100%   BMI 29.03 kg/m  Alert, aphasic. Drain lowered to 5cm because its purpose is to drain fluid, pressure is not a problem. But I would like to clear whatever part of the IVH would like to drain.  Moving all extremities Wound is clean, dry.

## 2019-08-28 NOTE — Progress Notes (Signed)
  Speech Language Pathology Treatment: Dysphagia  Patient Details Name: PEARLEAN SABINA MRN: [id number] DOB: 06-26-1981 Today's Date: 08/28/2019 Time: 3734-2876 SLP Time Calculation (min) (ACUTE ONLY): 7 min  Assessment / Plan / Recommendation Clinical Impression  Carnisha was much more engaged this morning during swallow treatment. Difficulty following commands due to receptive aphasia. Verbally responsive today in words and short phrases. Multiple sips thin via straw and puree consumed over course of swallow session and speech-language-cognitive evaluation. She completed 3 oz water and additional grape juice without outward indications aspiration as well as applesauce. Pt politely declined cracker however am recommending regular texture solids, thin liquids, straws allowed and pills with thin and brief ST intervention for swallow.    HPI HPI: SARAHMARIE LEAVEY is a 38 y.o. female with no significant past medical history admitted with right sided weakness and confusion. CT was done that showed a large left temporal ICH with extension into the ventricles. Per chart, pt gave birth August 2020.  She is s/p craniotomy with ventriculostomy.       SLP Plan  Continue with current plan of care       Recommendations  Diet recommendations: Regular;Thin liquid Liquids provided via: Cup;Straw Medication Administration: Whole meds with liquid Supervision: Patient able to self feed;Intermittent supervision to cue for compensatory strategies Compensations: Slow rate;Small sips/bites Postural Changes and/or Swallow Maneuvers: Seated upright 90 degrees                Oral Care Recommendations: Oral care BID Follow up Recommendations: Inpatient Rehab SLP Visit Diagnosis: Dysphagia, unspecified (R13.10) Plan: Continue with current plan of care                      Houston Siren 08/28/2019, 12:29 PM  Orbie Pyo Colvin Caroli.Ed Risk analyst  [phone] Office [phone]

## 2019-08-28 NOTE — Progress Notes (Signed)
Physical Therapy Treatment Patient Details Name: Amanda Small MRN: [id number] DOB: [birth year] Today's Date: 08/28/2019    History of Present Illness Pt is a 38 year old woman with unheralded onset of headache and inability to use her right side. CT revealed large left temporal ICH with intraventricular extension. Pt was intubated for airway protection. PT s/p left temporal craniotomy for hematoma evacuation and right frontal ventricular catheter placement. No pertinent history.    PT Comments    Pt demonstrating improvement from last session and was able to tolerate ambulation today. Pt however remains to demonstrate both expressive and receptive aphasia, impaired comprehension, delayed processing, impaired balance, and requires assist for transfers and ambulation to prevent fall. Pt did verbalize some asking "what are we doing?" and reported "whoa girl" when she looked in the mirror for the first time. Cont' to recommend CIR upon d/c as pt was indep PTA and has a 70 month old baby at home. Pt to strongly benefit from aggressive rehab program to return to safe indep function to presume care of her baby. Acute PT to cont to follow.    Follow Up Recommendations  CIR;Supervision/Assistance - 24 hour     Equipment Recommendations  Other (comment)    Recommendations for Other Services Rehab consult     Precautions / Restrictions Precautions Precautions: Fall Precaution Comments: EVD requires clamp Restrictions Weight Bearing Restrictions: No    Mobility  Bed Mobility Overal bed mobility: Needs Assistance Bed Mobility: Supine to Sit   Sidelying to sit: Min assist;HOB elevated Supine to sit: Min assist     General bed mobility comments: minA for safety as pt impulsive, pt transferred self to EOB but wasn't fluid  Transfers Overall transfer level: Needs assistance Equipment used: 1 person hand held assist Transfers: Sit to/from Stand Sit to Stand: Min assist          General transfer comment: pt impulsive requiring minA for safety, pt with lateral sway, staggering  Ambulation/Gait Ambulation/Gait assistance: Min assist;+2 safety/equipment(to manage IV pole and ventric) Gait Distance (Feet): 150 Feet Assistive device: 1 person hand held assist(hand over hand on IV pole with L UE) Gait Pattern/deviations: Step-through pattern;Decreased stride length;Staggering left;Staggering right Gait velocity: dec Gait velocity interpretation: <1.31 ft/sec, indicative of household ambulator General Gait Details: pt with lateral sway with occasional near cross over gait pattern requiring minA to steady and prevent fall, assist to manage multiple IV poles and IVC   Stairs             Wheelchair Mobility    Modified Rankin (Stroke Patients Only) Modified Rankin (Stroke Patients Only) Pre-Morbid Rankin Score: No symptoms Modified Rankin: Moderately severe disability     Balance Overall balance assessment: Needs assistance Sitting-balance support: No upper extremity supported Sitting balance-Leahy Scale: Good     Standing balance support: During functional activity Standing balance-Leahy Scale: Poor Standing balance comment: dependent on physical assist, observed pt brushing teeth with OT and she had to support self on forearms as she brushed her teeth                            Cognition Arousal/Alertness: Awake/alert Behavior During Therapy: Impulsive Overall Cognitive Status: Impaired/Different from baseline Area of Impairment: Orientation;Attention;Memory;Following commands;Safety/judgement;Awareness;Problem solving                 Orientation Level: Disoriented to;Place;Time;Situation(however unsure as pt has expressive and receptive aphasia) Current Attention Level: Sustained   Following Commands: Follows one  step commands with increased time;Follows one step commands inconsistently Safety/Judgement: Decreased awareness of  safety;Decreased awareness of deficits Awareness: Intellectual Problem Solving: Slow processing;Decreased initiation;Requires verbal cues;Difficulty sequencing;Requires tactile cues General Comments: pt follows commands better with demonstration versus verbal only, pt gave thumbs up to demo but unable to do "show me 2 fingers" to command, once shown pt attempted with thumb and pointer finger      Exercises      General Comments General comments (skin integrity, edema, etc.): VSS      Pertinent Vitals/Pain Pain Assessment: Faces Faces Pain Scale: Hurts a little bit Pain Location: pt would place hand on forehead and grimace t/o session Pain Descriptors / Indicators: Grimacing Pain Intervention(s): Monitored during session    Home Living                      Prior Function            PT Goals (current goals can now be found in the care plan section) Progress towards PT goals: Progressing toward goals    Frequency    Min 4X/week      PT Plan Current plan remains appropriate    Co-evaluation PT/OT/SLP Co-Evaluation/Treatment: Yes Reason for Co-Treatment: Complexity of the patient's impairments (multi-system involvement) PT goals addressed during session: Mobility/safety with mobility OT goals addressed during session: ADL's and self-care      AM-PAC PT "6 Clicks" Mobility   Outcome Measure  Help needed turning from your back to your side while in a flat bed without using bedrails?: A Little Help needed moving from lying on your back to sitting on the side of a flat bed without using bedrails?: A Little Help needed moving to and from a bed to a chair (including a wheelchair)?: A Little Help needed standing up from a chair using your arms (e.g., wheelchair or bedside chair)?: A Little Help needed to walk in hospital room?: A Lot Help needed climbing 3-5 steps with a railing? : A Lot 6 Click Score: 16    End of Session Equipment Utilized During Treatment:  Gait belt Activity Tolerance: Patient tolerated treatment well Patient left: in chair;with call bell/phone within reach;with chair alarm set Nurse Communication: Mobility status PT Visit Diagnosis: Other abnormalities of gait and mobility (R26.89);Difficulty in walking, not elsewhere classified (R26.2);Other symptoms and signs involving the nervous system (R29.898)     Time: 3235-5732 PT Time Calculation (min) (ACUTE ONLY): 29 min  Charges:  $Gait Training: 8-22 mins                     Lewis Shock, PT, DPT Acute Rehabilitation Services Pager #: [phone] Office #: [phone]    Iona Hansen 08/28/2019, 2:44 PM

## 2019-08-28 NOTE — Progress Notes (Signed)
STROKE TEAM PROGRESS NOTE   INTERVAL HISTORY  She is lying in bed and is quiet.  She was restless in the night requiring sedation.  She remains aphasic and is barely following any commands.  Blood pressure adequately controlled.  Ventriculostomy is draining well.  Neurosurgery plan on clamping ventriculostomy soon and doing diagnostic cerebral catheter angiogram later this week  OBJECTIVE Vitals:   08/28/19 0500 08/28/19 0600 08/28/19 0700 08/28/19 0800  BP: 121/74 109/63 127/61 (!) 109/55  Pulse: (!) 51 (!) 52 (!) 51 (!) 51  Resp: (!) 21 (!) 22 19 18   Temp:    98.7 F (37.1 C)  TempSrc:    Oral  SpO2: 99% 99% 99% 100%  Weight:      Height:        CBC:  Recent Labs  Lab 08/25/19 0608 08/27/19 0932 08/28/19 0631  WBC 9.6 12.9* 9.4  NEUTROABS 5.4  --   --   HGB 12.0 10.2* 10.1*  HCT 38.2 32.0* 32.4*  MCV 94.1 93.0 93.6  PLT 217 193 500    Basic Metabolic Panel:  Recent Labs  Lab 08/27/19 0932 08/28/19 0631  NA 141 145  K 3.5 3.4*  CL 108 110  CO2 25 25  GLUCOSE 131* 100*  BUN 7 11  CREATININE 0.57 0.74  CALCIUM 9.1 9.1    Lipid Panel:     Component Value Date/Time   CHOL 211 (H) 08/25/2019 1139   TRIG 153 (H) 08/25/2019 1139   TRIG 153 (H) 08/25/2019 1139   HDL 56 08/25/2019 1139   CHOLHDL 3.8 08/25/2019 1139   VLDL 31 08/25/2019 1139   LDLCALC 124 (H) 08/25/2019 1139   HgbA1c:  Lab Results  Component Value Date   HGBA1C 4.7 (L) 08/25/2019   Urine Drug Screen:     Component Value Date/Time   LABOPIA NONE DETECTED 08/25/2019 0613   COCAINSCRNUR NONE DETECTED 08/25/2019 0613   LABBENZ NONE DETECTED 08/25/2019 0613   AMPHETMU NONE DETECTED 08/25/2019 0613   THCU POSITIVE (A) 08/25/2019 0613   LABBARB NONE DETECTED 08/25/2019 0613    Alcohol Level     Component Value Date/Time   ETH <10 08/25/2019 0608    IMAGING   CT Head Wo Contrast 08/26/2019 Stable since yesterday. No new or worsening finding. Status post evacuation of a left  temporoparietal intraparenchymal hematoma.  No evidence of recurrent bleeding. Residual stable hemorrhage as described above.  Mild mass effect with left-to-right shift of 4 mm.  Ct Head Wo Contrast 08/25/2019 1. Postoperative changes from interval left craniotomy and left temporoparietal parenchymal hematoma evacuation. Persistent hemorrhage in the left temporal region measures 2.6 x 1.4 x 2.7 cm, and may be within a dilated left temporal horn or parenchymal and effacing the left temporal.  2. Persistent large volume hemorrhage within the left lateral ventricle. Small amount of hemorrhage also present within the third and now fourth ventricles.  3. Interval placement of a right frontal approach ventricular catheter terminating near the right foramen of Monro. The right lateral, third and fourth ventricles do not appear dilated.  4. Small volume subarachnoid hemorrhage overlying the left cerebral hemisphere.  5. Decreased mass effect with interval decrease in rightward midline shift, now 5 mm.   Ct Angio Head W Or Wo Contrast Ct Angio Neck W Or Wo Contrast 08/25/2019 1. Normal variant CTA Circle of Willis without significant proximal stenosis, aneurysm, or branch vessel occlusion. No acute or focal lesion to explain the left temporal lobe hemorrhage.  2. Normal CTA of the neck.  3. Endotracheal tube terminates 2 cm above the carina and could be pulled back 1-2 cm for more optimal positioning.   Ct Head Wo Contrast 08/25/2019 1. Large left temporal and parietal hematoma measures 7.8 x 3.3 x 3.0 cm.  2. Intraventricular extension of hemorrhage.  3. Diffuse left hemispheric mass effect with midline shift measuring 8 mm.   Dg Chest Port 1 View 08/25/2019 ETT in good position. OG tube courses into the stomach and below the film. No acute disease.  08/25/2019 No acute cardiopulmonary findings.   ECG - SR rate 60 BPM. (See cardiology reading for complete details)   PHYSICAL EXAM  Pleasant  middle-aged African-American lady not in distress.  . Afebrile. Head is nontraumatic. Neck is supple without bruit.    Cardiac exam no murmur or gallop. Lungs are clear to auscultation. Distal pulses are well felt.  She has ventriculostomy catheter on the right. Neurological Exam : Awake alert restless.  Aphasic.  Speaks a few words and occasional short sentences.  Follows simple midline and occasional one-step commands only.  Left gaze preference but will able to look to the right.  Blinks to threat on the left but not as well on the right.  Right lower facial weakness.  Tongue midline.  Able to withdraw all 4 extremities against gravity but will not cooperate for detailed exam.  Gait deferred ASSESSMENT/PLAN [name] is a 38 y.o. female with history of recent pregnancy with delivery in August presenting with HA, AMS, right sided weakness, and left gaze preference. She did not receive IV t-PA due to hemorrhage. Left temporal craniotomy and placement of EVD catheter 08/25/19. Will need cerebral angiogram at some point - Dr Conchita Paris.  Stroke: Large left temporal and parietal ICH w/ IVH s/p crani w/ evacuation and EVD placement- unknown etiology   CT head - 08/25/19 - Large left temporal and parietal hematoma. IVH. Diffuse left hemispheric mass effect with midline shift measuring 8 mm.   Repeat CTH - 08/25/19 - Postoperative L craniotomy and L ICH evacuation. Persistent hemorrhage L temporal region. Persistent large L lateral ventricle. IVH in third and now fourth ventricles. Ventricles not dilated. Small L cerebral SAH . Decreased mass effect with R midline shift, now 5 mm.  CTA head and neck Unremarkable   CTH - 08/26/19 - Stable. Mild mass effect with left-to-right shift of 4 mm.  2D Echo - pending    Loyal Jacobson Virus 2 - negative  LDL - 124  HgbA1c - 4.7  UDS - THCU  VTE prophylaxis - SCDs  No antithrombotic prior to admission, now on No antithrombotic  Therapy  recommendations:  CIR  Disposition:  Pending  Left temporal craniotomy and placement of EVD catheter 08/25/19 - Dr Franky Macho  Will need cerebral angiogram at some point - Dr Conchita Paris.  on Keppra  Pop off 15cm    Blood Pressure  Home BP meds: none, no hx HTN  Current BP meds: Labetalol prn -> change to hydralazine prn SBP > 160   Blood pressure runs low - SBP ~ 100  SBP goal < 160 mm Hg   . Long-term BP goal normotensive  Hyperlipidemia  Home Lipid lowering medication: none  LDL 124, goal < 70   Current lipid lowering medication: None (statin contraindicated with ICH)  Consider statin at discharge  Dysphagia, resolved . Secondary to stroke . NPO  . Added IVF at 60h . Speech on board . Now on  Vegetarian diet w/ thin liquids   Other Stroke Risk Factors  Obesity, Body mass index is 29.03 kg/m., recommend weight loss, diet and exercise as appropriate   THC use  Other Active Problems  Recent pregnancy with delivery in August  Temp 99.1->98.9 - IV Ancef started 08/25/19 - WBC 12.9->9.4  Hypokalemia - 3.1 - supplement and recheck - 3.4 - recheck am  Bradycardia - 30's - 08/26/19 - 9:37 pm - atropine per eLink - currently not on meds that should cause bradycardia except prn Labetalol - changed to hydralazine - monitor - remains in the 50s -  may need cardiology consult if it continues    CCM following as well  Hospital day # 3  Plan continue raising ventriculostomy pop-off to challenge patient and hopefully remove the catheter in the next few days if tolerated.  Diagnostic cerebral catheter angiogram also later this week per Dr. Conchita ParisNundkumar.  Continue strict control of blood pressure.  Mobilize out of bed.  Therapy and rehab consults.  No family available at the bedside for discussion. This patient is critically ill and at significant risk of neurological worsening, death and care requires constant monitoring of vital signs, hemodynamics,respiratory and cardiac  monitoring, extensive review of multiple databases, frequent neurological assessment, discussion with family, other specialists and medical decision making of high complexity.I have made any additions or clarifications directly to the above note.This critical care time does not reflect procedure time, or teaching time or supervisory time of PA/NP/Med Resident etc but could involve care discussion time.  I spent 30 minutes of neurocritical care time  in the care of  this patient.    Delia HeadyPramod Alejandria Wessells, MD  To contact Stroke Continuity provider, please refer to [email or web address]. After hours, contact General Neurology

## 2019-08-28 NOTE — Progress Notes (Signed)
Occupational Therapy Treatment Patient Details Name: Amanda Small MRN: [id number] DOB: Apr 12, 1981 Today's Date: 08/28/2019    History of present illness Pt is a 38 year old woman with unheralded onset of headache and inability to use her right side. CT revealed large left temporal ICH with intraventricular extension. Pt was intubated for airway protection. PT s/p left temporal craniotomy for hematoma evacuation and right frontal ventricular catheter placement. No pertinent history.   OT comments  This 38 yo female admitted with above seen today in conjunction with PT due to needing increased A on eval. Today patient doing better and really only needed +2 due to lines. Pt still struggles with following commands, receptive/expressive communication, sequencing (requiring multi-modal cues at times), decreased safety awareness/awareness of deficits-all affecting her safety and independence with basic ADLs and IADLs as she was totally independent pta and has a 35 month old. She will continue to benefit from acute OT with follow up on CIR.   Follow Up Recommendations  CIR;Supervision/Assistance - 24 hour    Equipment Recommendations  Other (comment)(TBD at next venue)       Precautions / Restrictions Precautions Precautions: Fall Precaution Comments: EVD requires clamp Restrictions Weight Bearing Restrictions: No       Mobility Bed Mobility Overal bed mobility: Needs Assistance Bed Mobility: Supine to Sit   Sidelying to sit: Min assist;HOB elevated       General bed mobility comments: When asked patient to sit up using verbal and gestural cues she did not move. Helped patient intitate with moving her legs for her, but still did not follow through so had to move her legs more for her and then she finally started to move on her own to the EOB  Transfers Overall transfer level: Needs assistance Equipment used: 1 person hand held assist(pushing IV pole) Transfers: Sit to/from  Stand Sit to Stand: Min assist              Balance Overall balance assessment: Needs assistance Sitting-balance support: No upper extremity supported;Feet supported Sitting balance-Leahy Scale: Good     Standing balance support: During functional activity;Bilateral upper extremity supported;Single extremity supported   Standing balance comment: Pt propping on forearms on sink to brush teeth. standing in room, pt with one hand on IV pole                           ADL either performed or assessed with clinical judgement   ADL Overall ADL's : (P) Needs assistance/impaired Eating/Feeding: (P) Supervision/ safety;Set up Eating/Feeding Details (indicate cue type and reason): (P) cueing--does not always self initiate Grooming: (P) Oral care;Wash/dry face Grooming Details (indicate cue type and reason): (P) setup/S to wash face seated in recliner with VCs and tactile cues; to brush teeth she did this in standing and sequenced through all the step except recapping the toothpaste and instead of turning water off after she finished she asked, "now what". Initally was placing tooth paste on wrong end of toothbrush but self corrected. Dropped toothpaste off of toothbrush as she brought it to her mouth,but realized this and put more on.               Lower Body Dressing Details (indicate cue type and reason): (P) Able to put her right sock on while sitting in bed once I handed her the sock and touched her foot Toilet Transfer: (P) Minimal assistance;Ambulation  Vision   Additional Comments: Did notice patient was not always directly hitting the water when she was trying to wash of her toothbrush          Cognition Arousal/Alertness: Awake/alert Behavior During Therapy: Impulsive Overall Cognitive Status: Impaired/Different from baseline Area of Impairment: Attention;Following commands;Safety/judgement;Problem solving                    Current Attention Level: Sustained   Following Commands: Follows one step commands with increased time(and addtional cues (gestural/tactile)) Safety/Judgement: Decreased awareness of safety;Decreased awareness of deficits   Problem Solving: Slow processing;Decreased initiation;Requires verbal cues;Requires tactile cues;Difficulty sequencing General Comments: Pt following 75% of commands (but required multi-modal cues)                   Pertinent Vitals/ Pain       Pain Assessment: Faces Faces Pain Scale: No hurt         Frequency  Min 3X/week        Progress Toward Goals  OT Goals(current goals can now be found in the care plan section)  Progress towards OT goals: Progressing toward goals     Plan Discharge plan remains appropriate    Co-evaluation    PT/OT/SLP Co-Evaluation/Treatment: Yes Reason for Co-Treatment: For patient/therapist safety;To address functional/ADL transfers PT goals addressed during session: Mobility/safety with mobility;Balance OT goals addressed during session: ADL's and self-care      AM-PAC OT "6 Clicks" Daily Activity     Outcome Measure   Help from another person eating meals?: A Little Help from another person taking care of personal grooming?: A Little Help from another person toileting, which includes using toliet, bedpan, or urinal?: A Little Help from another person bathing (including washing, rinsing, drying)?: A Lot Help from another person to put on and taking off regular upper body clothing?: A Lot Help from another person to put on and taking off regular lower body clothing?: A Lot 6 Click Score: 15    End of Session Equipment Utilized During Treatment: Gait belt  OT Visit Diagnosis: Muscle weakness (generalized) (M62.81);Unsteadiness on feet (R26.81);Other symptoms and signs involving cognitive function   Activity Tolerance Patient tolerated treatment well   Patient Left in chair;with call bell/phone within  reach;with chair alarm set   Nurse Communication Mobility status        Time: 2025-4270 OT Time Calculation (min): 31 min  Charges: OT General Charges $OT Visit: 1 Visit OT Treatments $Self Care/Home Management : 8-22 mins  Ignacia Palma, OTR/L Acute [employer] Pager [phone] Office [phone]      Evette Georges 08/28/2019, 12:59 PM

## 2019-08-28 NOTE — Progress Notes (Signed)
  Echocardiogram 2D Echocardiogram has been performed.  Jennette Dubin 08/28/2019, 2:49 PM

## 2019-08-29 DIAGNOSIS — R4701 Aphasia: Secondary | ICD-10-CM

## 2019-08-29 DIAGNOSIS — I639 Cerebral infarction, unspecified: Secondary | ICD-10-CM

## 2019-08-29 LAB — BASIC METABOLIC PANEL
Anion gap: 11 (ref 5–15)
BUN: 10 mg/dL (ref 6–20)
CO2: 24 mmol/L (ref 22–32)
Calcium: 8.9 mg/dL (ref 8.9–10.3)
Chloride: 108 mmol/L (ref 98–111)
Creatinine, Ser: 0.69 mg/dL (ref 0.44–1.00)
GFR calc Af Amer: >60 mL/min (ref >60)
GFR calc non Af Amer: >60 mL/min (ref >60)
Glucose, Bld: 96 mg/dL (ref 70–99)
Potassium: 3.5 mmol/L (ref 3.5–5.1)
Sodium: 143 mmol/L (ref 135–145)

## 2019-08-29 LAB — CBC
HCT: 32.7 % — ABNORMAL LOW (ref 36.0–46.0)
Hemoglobin: 10.3 g/dL — ABNORMAL LOW (ref 12.0–15.0)
MCH: 29.5 pg (ref 26.0–34.0)
MCHC: 31.5 g/dL (ref 30.0–36.0)
MCV: 93.7 fL (ref 80.0–100.0)
Platelets: 207 10*3/uL (ref 150–400)
RBC: 3.49 MIL/uL — ABNORMAL LOW (ref 3.87–5.11)
RDW: 12.8 % (ref 11.5–15.5)
WBC: 8.7 10*3/uL (ref 4.0–10.5)
nRBC: 0 % (ref 0.0–0.2)

## 2019-08-29 MED ORDER — SODIUM CHLORIDE 0.9% FLUSH
10.0000 mL | INTRAVENOUS | Status: DC | PRN
  Administered 2019-09-02: 10 mL

## 2019-08-29 MED ORDER — SODIUM CHLORIDE 0.9% FLUSH
10.0000 mL | Freq: Two times a day (BID) | INTRAVENOUS | Status: DC
  Administered 2019-08-30 – 2019-09-05 (×10): 10 mL

## 2019-08-29 NOTE — Progress Notes (Signed)
Offered to pump patient twice this afternoon. Sister at bedside. Patient refused.

## 2019-08-29 NOTE — Progress Notes (Signed)
Patient ID: Amanda Small, female   DOB: Mar 04, 1981, 38 y.o.   MRN: [id number] BP 139/81   Pulse (!) 55   Temp 98.5 F (36.9 C) (Oral)   Resp 18   Ht 5\' 8"  (1.727 m)   Wt 86.6 kg   SpO2 100%   BMI 29.03 kg/m  Alert, language improving, remains aphasic Wound is clean, dry, no signs of infection Doing well Will check CT to see how much blood remains in the ventricle. Will use that as a guide for removing ventricular catheter.

## 2019-08-29 NOTE — Progress Notes (Signed)
Pumped right and left breast for 15 minutes. 20 cc output. Stored in refrigerator.

## 2019-08-29 NOTE — Progress Notes (Signed)
STROKE TEAM PROGRESS NOTE   INTERVAL HISTORY  Patient is awake alert she continues to have expressive aphasia but is able to speak a few words and occasional sentences.  Blood pressure adequately controlled.  Ventriculostomy catheter draining well.  No new complaints or issues.  OBJECTIVE Vitals:   08/29/19 0500 08/29/19 0600 08/29/19 0700 08/29/19 0800  BP: 115/62 (!) 98/52 115/60 (!) 112/56  Pulse:      Resp: 18 18 (!) 21 19  Temp:    98.5 F (36.9 C)  TempSrc:    Oral  SpO2:      Weight:      Height:        CBC:  Recent Labs  Lab 08/25/19 0608  08/28/19 0631 08/29/19 0520  WBC 9.6   < > 9.4 8.7  NEUTROABS 5.4  --   --   --   HGB 12.0   < > 10.1* 10.3*  HCT 38.2   < > 32.4* 32.7*  MCV 94.1   < > 93.6 93.7  PLT 217   < > 192 207   < > = values in this interval not displayed.    Basic Metabolic Panel:  Recent Labs  Lab 08/28/19 0631 08/29/19 0520  NA 145 143  K 3.4* 3.5  CL 110 108  CO2 25 24  GLUCOSE 100* 96  BUN 11 10  CREATININE 0.74 0.69  CALCIUM 9.1 8.9    Lipid Panel:     Component Value Date/Time   CHOL 211 (H) 08/25/2019 1139   TRIG 153 (H) 08/25/2019 1139   TRIG 153 (H) 08/25/2019 1139   HDL 56 08/25/2019 1139   CHOLHDL 3.8 08/25/2019 1139   VLDL 31 08/25/2019 1139   LDLCALC 124 (H) 08/25/2019 1139   HgbA1c:  Lab Results  Component Value Date   HGBA1C 4.7 (L) 08/25/2019   Urine Drug Screen:     Component Value Date/Time   LABOPIA NONE DETECTED 08/25/2019 0613   COCAINSCRNUR NONE DETECTED 08/25/2019 0613   LABBENZ NONE DETECTED 08/25/2019 0613   AMPHETMU NONE DETECTED 08/25/2019 0613   THCU POSITIVE (A) 08/25/2019 0613   LABBARB NONE DETECTED 08/25/2019 0613    Alcohol Level     Component Value Date/Time   ETH <10 08/25/2019 0608    IMAGING   CT Head Wo Contrast 08/26/2019 Stable since yesterday. No new or worsening finding. Status post evacuation of a left temporoparietal intraparenchymal hematoma.  No evidence of recurrent  bleeding. Residual stable hemorrhage as described above.  Mild mass effect with left-to-right shift of 4 mm.  Ct Head Wo Contrast 08/25/2019 1. Postoperative changes from interval left craniotomy and left temporoparietal parenchymal hematoma evacuation. Persistent hemorrhage in the left temporal region measures 2.6 x 1.4 x 2.7 cm, and may be within a dilated left temporal horn or parenchymal and effacing the left temporal.  2. Persistent large volume hemorrhage within the left lateral ventricle. Small amount of hemorrhage also present within the third and now fourth ventricles.  3. Interval placement of a right frontal approach ventricular catheter terminating near the right foramen of Monro. The right lateral, third and fourth ventricles do not appear dilated.  4. Small volume subarachnoid hemorrhage overlying the left cerebral hemisphere.  5. Decreased mass effect with interval decrease in rightward midline shift, now 5 mm.   Ct Angio Head W Or Wo Contrast Ct Angio Neck W Or Wo Contrast 08/25/2019 1. Normal variant CTA Circle of Willis without significant proximal stenosis, aneurysm, or branch  vessel occlusion. No acute or focal lesion to explain the left temporal lobe hemorrhage.  2. Normal CTA of the neck.  3. Endotracheal tube terminates 2 cm above the carina and could be pulled back 1-2 cm for more optimal positioning.   Ct Head Wo Contrast 08/25/2019 1. Large left temporal and parietal hematoma measures 7.8 x 3.3 x 3.0 cm.  2. Intraventricular extension of hemorrhage.  3. Diffuse left hemispheric mass effect with midline shift measuring 8 mm.   Dg Chest Port 1 View 08/25/2019 ETT in good position. OG tube courses into the stomach and below the film. No acute disease.  08/25/2019 No acute cardiopulmonary findings.   2D Echocardiogram 1. Left ventricular ejection fraction, by visual estimation, is 60 to 65%. The left ventricle has normal function. There is no left ventricular  hypertrophy.  2. The left ventricle has no regional wall motion abnormalities.  3. Global right ventricle has normal systolic function.The right ventricular size is normal. No increase in right ventricular wall thickness.  4. Left atrial size was normal.  5. Right atrial size was normal.  6. The mitral valve is normal in structure. Trace mitral valve regurgitation. No evidence of mitral stenosis.  7. The tricuspid valve is normal in structure. Tricuspid valve regurgitation is not demonstrated.  8. The aortic valve is normal in structure. Aortic valve regurgitation is not visualized. No evidence of aortic valve sclerosis or stenosis.  9. The pulmonic valve was normal in structure. Pulmonic valve regurgitation is not visualized. 10. The inferior vena cava is normal in size with <50% respiratory variability, suggesting right atrial pressure of 8 mmHg.  ECG - SR rate 60 BPM. (See cardiology reading for complete details)   PHYSICAL EXAM   Pleasant middle-aged African-American lady not in distress.  . Afebrile. Head is nontraumatic. Neck is supple without bruit.    Cardiac exam no murmur or gallop. Lungs are clear to auscultation. Distal pulses are well felt.  She has ventriculostomy catheter on the right. Neurological Exam : Awake alert restless.  Expressive aphasia.Marland Kitchen  Speaks a few words and occasional short sentences.  Follows simple midline and one-step commands only.  Left gaze preference but will able to look to the right.  Blinks to threat on the left but not as well on the right.  Right lower facial weakness.  Tongue midline.  Able to withdraw all 4 extremities against gravity but will not cooperate for detailed exam.  Gait deferred   ASSESSMENT/PLAN Ms. Donnisha ERICA RICHWINE is a 38 y.o. female with history of recent pregnancy with delivery in August presenting with HA, AMS, right sided weakness, and left gaze preference. She did not receive IV t-PA due to hemorrhage. Left temporal craniotomy and  placement of EVD catheter 08/25/19. Will need cerebral angiogram at some point - Dr Conchita Paris.  Stroke: Large left temporal and parietal ICH w/ IVH s/p crani w/ evacuation and EVD placement- unknown etiology   CT head - 08/25/19 - Large left temporal and parietal hematoma. IVH. Diffuse left hemispheric mass effect with midline shift measuring 8 mm.   Repeat CTH - 08/25/19 - Postoperative L craniotomy and L ICH evacuation. Persistent hemorrhage L temporal region. Persistent large L lateral ventricle. IVH in third and now fourth ventricles. Ventricles not dilated. Small L cerebral SAH . Decreased mass effect with R midline shift, now 5 mm.  CTA head and neck Unremarkable   CTH - 08/26/19 - Stable. Mild mass effect with left-to-right shift of 4 mm.  2D  Echo - EF 60-65%. No source of embolus    [employer] Corona Virus 2 - negative  LDL - 124  HgbA1c - 4.7  UDS - THCU  VTE prophylaxis - SCDs  No antithrombotic prior to admission, now on No antithrombotic  Therapy recommendations:  CIR  Disposition:  Pending  Left temporal craniotomy and placement of EVD catheter 08/25/19 - Dr Franky Machoabbell  Will need cerebral angiogram at some point   on Keppra  EVD draining, pt tolerating well  Blood Pressure  Home BP meds: none, no hx HTN  Current BP meds: Labetalol prn -> change to hydralazine prn SBP > 160   Blood pressure runs low - SBP ~ 100s  SBP goal < 160 mm Hg   . Long-term BP goal normotensive  Hyperlipidemia  Home Lipid lowering medication: none  LDL 124, goal < 70   Current lipid lowering medication: None (statin contraindicated with ICH)  Consider statin at discharge  Dysphagia, resolved . Secondary to stroke . NPO  . Added IVF at 60h . Speech on board . Now on Vegetarian diet w/ thin liquids   Other Stroke Risk Factors  Obesity, Body mass index is 29.03 kg/m., recommend weight loss, diet and exercise as appropriate   THC use  Other Active Problems  Recent pregnancy  with delivery in August  Temp 99.1->98.9 - IV Ancef started 08/25/19 - WBC 12.9->9.4->8.7  Hypokalemia - 3.1 - supplement and recheck - 3.4 - recheck  - 3.5  Bradycardia - 30's - 08/26/19 - 9:37 pm - atropine per eLink - currently not on meds that should cause bradycardia except prn Labetalol - changed to hydralazine - monitor - remains in the 50s  Hospital day # 4 Continue strict blood pressure control with systolic goal now below 160 and ventriculostomy drainage as per neurosurgery.  Likely diagnostic cerebral catheter angiogram in the next couple of days and will discuss with Dr. Conchita ParisNundkumar the timing.  Mobilize out of bed.  Therapy consults..  I spoke to patient's sister at the bedside yesterday and updated her about patient's care and answered questions.  Discussed with Dr. Conchita ParisNundkumar.  This patient is critically ill and at significant risk of neurological worsening, death and care requires constant monitoring of vital signs, hemodynamics,respiratory and cardiac monitoring, extensive review of multiple databases, frequent neurological assessment, discussion with family, other specialists and medical decision making of high complexity.I have made any additions or clarifications directly to the above note.This critical care time does not reflect procedure time, or teaching time or supervisory time of PA/NP/Med Resident etc but could involve care discussion time.  I spent 30 minutes of neurocritical care time  in the care of  this patient.    Delia HeadyPramod Jonah Gingras, MD  To contact Stroke Continuity provider, please refer to [email or web address]. After hours, contact General Neurology

## 2019-08-29 NOTE — Progress Notes (Signed)
Physical Therapy Treatment Patient Details Name: Amanda Small MRN: [id number] DOB: 1980-11-13 Today's Date: 08/29/2019    History of Present Illness Pt is a 38 year old woman with unheralded onset of headache and inability to use her right side. CT revealed large left temporal ICH with intraventricular extension. Pt was intubated for airway protection. PT s/p left temporal craniotomy for hematoma evacuation and right frontal ventricular catheter placement. No pertinent history.    PT Comments    Pt appears to understand much of the simple commands/questions, but can not answer appropriately.  Emphasis on transitions, scooting and balance, sit to stand and progressing gait stability.  Follow Up Recommendations  CIR;Supervision/Assistance - 24 hour     Equipment Recommendations  Other (comment)    Recommendations for Other Services Rehab consult     Precautions / Restrictions Precautions Precautions: Fall Precaution Comments: EVD requires clamp Restrictions Weight Bearing Restrictions: No    Mobility  Bed Mobility Overal bed mobility: Needs Assistance Bed Mobility: Supine to Sit     Supine to sit: Min assist     General bed mobility comments: min  Transfers Overall transfer level: Needs assistance Equipment used: 1 person hand held assist Transfers: Sit to/from Stand Sit to Stand: Min assist         General transfer comment: pt impulsive requiring minA for safety, pt with lateral sway, staggering  Ambulation/Gait Ambulation/Gait assistance: Min assist;+2 safety/equipment(to manage lines) Gait Distance (Feet): 150 Feet Assistive device: 1 person hand held assist(hand over hand on IV pole with L UE) Gait Pattern/deviations: Step-through pattern;Decreased stride length;Staggering left;Staggering right Gait velocity: decreased   General Gait Details: Pt was mildly unsteady at times, but did not display drift, sway or alot of scissoring as last  session.   Stairs             Wheelchair Mobility    Modified Rankin (Stroke Patients Only) Modified Rankin (Stroke Patients Only) Pre-Morbid Rankin Score: No symptoms     Balance Overall balance assessment: Needs assistance Sitting-balance support: No upper extremity supported Sitting balance-Leahy Scale: Good     Standing balance support: During functional activity Standing balance-Leahy Scale: Poor                              Cognition Arousal/Alertness: Awake/alert Behavior During Therapy: WFL for tasks assessed/performed Overall Cognitive Status: (NT formally needed multimodal cuing for commands)                   Orientation Level: (however unsure as pt has expressive and receptive aphasia)             General Comments: pt follows commands better with demonstration versus verbal only, pt gave thumbs up to demo but unable to do "show me 2 fingers" to command, once shown pt attempted with thumb and pointer finger      Exercises      General Comments General comments (skin integrity, edema, etc.): vss      Pertinent Vitals/Pain Pain Assessment: Faces Faces Pain Scale: No hurt    Home Living                      Prior Function            PT Goals (current goals can now be found in the care plan section) Acute Rehab PT Goals Patient Stated Goal: none stated PT Goal Formulation: Patient unable to participate in goal  setting Time For Goal Achievement: 09/10/19 Potential to Achieve Goals: Good Progress towards PT goals: Progressing toward goals    Frequency    Min 4X/week      PT Plan Current plan remains appropriate    Co-evaluation PT/OT/SLP Co-Evaluation/Treatment: Yes            AM-PAC PT "6 Clicks" Mobility   Outcome Measure  Help needed turning from your back to your side while in a flat bed without using bedrails?: A Little Help needed moving from lying on your back to sitting on the side of  a flat bed without using bedrails?: A Little Help needed moving to and from a bed to a chair (including a wheelchair)?: A Little Help needed standing up from a chair using your arms (e.g., wheelchair or bedside chair)?: A Little Help needed to walk in hospital room?: A Little Help needed climbing 3-5 steps with a railing? : A Lot 6 Click Score: 17    End of Session Equipment Utilized During Treatment: Gait belt Activity Tolerance: Patient tolerated treatment well Patient left: in chair;with call bell/phone within reach;with chair alarm set Nurse Communication: Mobility status PT Visit Diagnosis: Other abnormalities of gait and mobility (R26.89);Other symptoms and signs involving the nervous system (R29.898)     Time: 4656-8127 PT Time Calculation (min) (ACUTE ONLY): 28 min  Charges:  $Gait Training: 8-22 mins $Therapeutic Activity: 8-22 mins                     08/29/2019  [place] Bing, PT Acute Rehabilitation Services [phone]  (pager) [phone]  (office)   Amanda Small 08/29/2019, 7:25 PM

## 2019-08-30 ENCOUNTER — Encounter (HOSPITAL_COMMUNITY): Payer: Self-pay | Admitting: Radiology

## 2019-08-30 ENCOUNTER — Inpatient Hospital Stay (HOSPITAL_COMMUNITY): Payer: 59

## 2019-08-30 LAB — ANTITHROMBIN III: AntiThromb III Func: 96 % (ref 75–120)

## 2019-08-30 NOTE — Progress Notes (Signed)
STROKE TEAM PROGRESS NOTE   INTERVAL HISTORY  Patient is awake alert continues to have expressive aphasia but able to speak short sentences.  Follows commands well.  Blood pressure adequately controlled.  Follow-up CT scan shows unchanged appearance of intraventricular blood in left temporal craniotomy site with slight reduction in pneumocephalus.  There is prominent thrombosed cortical vein of the left convexity  OBJECTIVE Vitals:   08/30/19 0500 08/30/19 0600 08/30/19 0700 08/30/19 0800  BP: 117/83 123/69 109/69 (!) 93/49  Pulse:  61 (!) 55 (!) 55  Resp:  (!) 21 (!) 22 18  Temp:    98.6 F (37 C)  TempSrc:    Axillary  SpO2:  98% 97% 99%  Weight:      Height:        CBC:  Recent Labs  Lab 08/25/19 0608  08/28/19 0631 08/29/19 0520  WBC 9.6   < > 9.4 8.7  NEUTROABS 5.4  --   --   --   HGB 12.0   < > 10.1* 10.3*  HCT 38.2   < > 32.4* 32.7*  MCV 94.1   < > 93.6 93.7  PLT 217   < > 192 207   < > = values in this interval not displayed.    Basic Metabolic Panel:  Recent Labs  Lab 08/28/19 0631 08/29/19 0520  NA 145 143  K 3.4* 3.5  CL 110 108  CO2 25 24  GLUCOSE 100* 96  BUN 11 10  CREATININE 0.74 0.69  CALCIUM 9.1 8.9    Lipid Panel:     Component Value Date/Time   CHOL 211 (H) 08/25/2019 1139   TRIG 153 (H) 08/25/2019 1139   TRIG 153 (H) 08/25/2019 1139   HDL 56 08/25/2019 1139   CHOLHDL 3.8 08/25/2019 1139   VLDL 31 08/25/2019 1139   LDLCALC 124 (H) 08/25/2019 1139   HgbA1c:  Lab Results  Component Value Date   HGBA1C 4.7 (L) 08/25/2019   Urine Drug Screen:     Component Value Date/Time   LABOPIA NONE DETECTED 08/25/2019 0613   COCAINSCRNUR NONE DETECTED 08/25/2019 0613   LABBENZ NONE DETECTED 08/25/2019 0613   AMPHETMU NONE DETECTED 08/25/2019 0613   THCU POSITIVE (A) 08/25/2019 0613   LABBARB NONE DETECTED 08/25/2019 0613    Alcohol Level     Component Value Date/Time   ETH <10 08/25/2019 0608    IMAGING   Ct Head Wo  Contrast 08/30/2019 1. Unchanged left temporal and left lateral intraventricular hemorrhage. No hydrocephalus. 2. Linear high-density along the left frontal convexity could be postoperative blood products but is notably tubular and could also be a thrombosed vein of Trolard. Consider venous imaging.   CT Head Wo Contrast 08/26/2019 Stable since yesterday. No new or worsening finding. Status post evacuation of a left temporoparietal intraparenchymal hematoma.  No evidence of recurrent bleeding. Residual stable hemorrhage as described above.  Mild mass effect with left-to-right shift of 4 mm.  Ct Head Wo Contrast 08/25/2019 1. Postoperative changes from interval left craniotomy and left temporoparietal parenchymal hematoma evacuation. Persistent hemorrhage in the left temporal region measures 2.6 x 1.4 x 2.7 cm, and may be within a dilated left temporal horn or parenchymal and effacing the left temporal.  2. Persistent large volume hemorrhage within the left lateral ventricle. Small amount of hemorrhage also present within the third and now fourth ventricles.  3. Interval placement of a right frontal approach ventricular catheter terminating near the right foramen of Monro. The  right lateral, third and fourth ventricles do not appear dilated.  4. Small volume subarachnoid hemorrhage overlying the left cerebral hemisphere.  5. Decreased mass effect with interval decrease in rightward midline shift, now 5 mm.   Ct Angio Head W Or Wo Contrast Ct Angio Neck W Or Wo Contrast 08/25/2019 1. Normal variant CTA Circle of Willis without significant proximal stenosis, aneurysm, or branch vessel occlusion. No acute or focal lesion to explain the left temporal lobe hemorrhage.  2. Normal CTA of the neck.  3. Endotracheal tube terminates 2 cm above the carina and could be pulled back 1-2 cm for more optimal positioning.   Ct Head Wo Contrast 08/25/2019 1. Large left temporal and parietal hematoma measures 7.8  x 3.3 x 3.0 cm.  2. Intraventricular extension of hemorrhage.  3. Diffuse left hemispheric mass effect with midline shift measuring 8 mm.   Dg Chest Port 1 View 08/25/2019 ETT in good position. OG tube courses into the stomach and below the film. No acute disease.  08/25/2019 No acute cardiopulmonary findings.   2D Echocardiogram 1. Left ventricular ejection fraction, by visual estimation, is 60 to 65%. The left ventricle has normal function. There is no left ventricular hypertrophy.  2. The left ventricle has no regional wall motion abnormalities.  3. Global right ventricle has normal systolic function.The right ventricular size is normal. No increase in right ventricular wall thickness.  4. Left atrial size was normal.  5. Right atrial size was normal.  6. The mitral valve is normal in structure. Trace mitral valve regurgitation. No evidence of mitral stenosis.  7. The tricuspid valve is normal in structure. Tricuspid valve regurgitation is not demonstrated.  8. The aortic valve is normal in structure. Aortic valve regurgitation is not visualized. No evidence of aortic valve sclerosis or stenosis.  9. The pulmonic valve was normal in structure. Pulmonic valve regurgitation is not visualized. 10. The inferior vena cava is normal in size with <50% respiratory variability, suggesting right atrial pressure of 8 mmHg.  ECG - SR rate 60 BPM. (See cardiology reading for complete details)   PHYSICAL EXAM    Pleasant middle-aged African-American lady not in distress.  . Afebrile. Head is nontraumatic. Neck is supple without bruit.    Cardiac exam no murmur or gallop. Lungs are clear to auscultation. Distal pulses are well felt.  She has ventriculostomy catheter on the right. Neurological Exam : Awake alert restless.  Moderate expressive aphasia.Marland Kitchen.  Speaks a few words and occasional short sentences.  Follows simple midline and one-step commands only.  Left gaze preference but will able to look to  the right.  Blinks to threat on the left but not as well on the right.  Right lower facial weakness.  Tongue midline.  Able to withdraw all 4 extremities against gravity but will not cooperate for detailed exam.  Gait deferred   ASSESSMENT/PLAN Amanda Small is a 38 y.o. female with history of recent pregnancy with delivery in August presenting with HA, AMS, right sided weakness, and left gaze preference. She did not receive IV t-PA due to hemorrhage. Left temporal craniotomy and placement of EVD catheter 08/25/19. Will need cerebral angiogram at some point - Dr Conchita ParisNundkumar.  Stroke: Large left temporal and parietal ICH w/ IVH s/p crani w/ evacuation and EVD placement- unknown etiology   CT head - 08/25/19 - Large left temporal and parietal hematoma. IVH. Diffuse left hemispheric mass effect with midline shift measuring 8 mm.   Repeat CTH -  08/25/19 - Postoperative L craniotomy and L ICH evacuation. Persistent hemorrhage L temporal region. Persistent large L lateral ventricle. IVH in third and now fourth ventricles. Ventricles not dilated. Small L cerebral SAH . Decreased mass effect with R midline shift, now 5 mm.  CTA head and neck Unremarkable   CTH - 08/26/19 - Stable. Mild mass effect with left-to-right shift of 4 mm.  Repeat CT 12/9 unchanged L temporal lobe and L lateral IVH. No hydro. Linear density L frontal convexity.   2D Echo - EF 60-65%. No source of embolus    [employer] Virus 2 - negative  LDL - 124  HgbA1c - 4.7  Hypercoagulable labs pending   UDS - THCU  VTE prophylaxis - SCDs  No antithrombotic prior to admission, now on No antithrombotic  Therapy recommendations:  CIR  Disposition:  Pending  Left temporal craniotomy and placement of EVD catheter 08/25/19 - Dr Franky Macho  on Keppra  EVD draining, pt tolerating well  Repeat CT 12/9 unchanged L temporal lobe and L lateral IVH. No hydro. Linear density L frontal convexity ? Thrombosed vein.   For cerebral  angiogram at some point by Dr. Conchita Paris next several days  Blood Pressure  Home BP meds: none, no hx HTN  Current BP meds: Labetalol prn -> change to hydralazine prn SBP > 160   Blood pressure runs low - SBP ~ 90s  SBP goal < 160 mm Hg   . Long-term BP goal normotensive  Hyperlipidemia  Home Lipid lowering medication: none  LDL 124, goal < 70   Current lipid lowering medication: None (statin contraindicated with ICH)  Consider statin at discharge  Dysphagia, resolved . Secondary to stroke . on IVF at 60h as breast feeding . Speech on board . Now on Vegetarian diet w/ thin liquids   Other Stroke Risk Factors  Obesity, Body mass index is 29.03 kg/m., recommend weight loss, diet and exercise as appropriate   THC use  Other Active Problems  Recent pregnancy with delivery in August  Temp 99.1->98.9 - IV Ancef started 08/25/19 - WBC 12.9->9.4->8.7  Hypokalemia - 3.1 - supplement and recheck - 3.4 - recheck  - 3.5  Bradycardia - 30's - 08/26/19 - 9:37 pm - atropine per eLink - currently not on meds that should cause bradycardia except prn Labetalol - changed to hydralazine - monitor - remains in the 50s  Hospital day # 5  Follow-up CT scan shows stable appearance of the intraventricular hemorrhage and craniotomy site but prominent tubular structure over the left convexity likely a thrombosed vein but unclear whether this is secondary to post craniotomy changes or the primary cause of hemorrhagic venous infarct from vein of Trolard thrombosis.  Regardless given the extent of intraventricular hemorrhage patient is not a candidate for anticoagulation at the present time.  Plan on starting aspirin 81 mg daily.  Await diagnostic cerebral catheter angiogram by Dr. Conchita Paris.  Continue ventriculostomy care as per neurosurgery.  Continue ongoing therapies.  Maintain strict blood pressure control  This patient is critically ill and at significant risk of neurological worsening, death  and care requires constant monitoring of vital signs, hemodynamics,respiratory and cardiac monitoring, extensive review of multiple databases, frequent neurological assessment, discussion with family, other specialists and medical decision making of high complexity.I have made any additions or clarifications directly to the above note.This critical care time does not reflect procedure time, or teaching time or supervisory time of PA/NP/Med Resident etc but could involve care discussion time. I  spent 30 minutes of neurocritical care time  in the care of  this patient.  Antony Contras, MD  To contact Stroke Continuity provider, please refer to [email or web address] After hours, contact General Neurology

## 2019-08-30 NOTE — Progress Notes (Signed)
Physical Therapy Treatment Patient Details Name: Amanda Small MRN: [id number] DOB: December 20, 1980 Today's Date: 08/30/2019    History of Present Illness Pt is a 38 year old woman with unheralded onset of headache and inability to use her right side. CT revealed large left temporal ICH with intraventricular extension. Pt was intubated for airway protection. PT s/p left temporal craniotomy for hematoma evacuation and right frontal ventricular catheter placement. No pertinent history.    PT Comments    Patient progressing with mobility.  Currently minguard to S level, however with significant limitations due to expressive and receptive aphasia as well as some continued impulsivity and limited ADL performance.  Feel she remains appropriate for CIR level rehab for continued progress with safety/speech and ADL's in addition to mobility skills prior to d/c home. Still needing EVD so likely not ready yet for d/c.  PT to follow.    Follow Up Recommendations  CIR;Supervision/Assistance - 24 hour     Equipment Recommendations  None recommended by PT    Recommendations for Other Services       Precautions / Restrictions Precautions Precautions: Fall Precaution Comments: EVD requires clamp    Mobility  Bed Mobility   Bed Mobility: Supine to Sit;Sit to Supine     Supine to sit: Supervision;HOB elevated Sit to supine: Supervision   General bed mobility comments: able to get up to EOB on her own, assist for safety with lines/EVD  Transfers Overall transfer level: Needs assistance Equipment used: None Transfers: Sit to/from Stand Sit to Stand: Supervision         General transfer comment: again assist for lines, steady with sit to stand  Ambulation/Gait Ambulation/Gait assistance: Min guard Gait Distance (Feet): 400 Feet Assistive device: None Gait Pattern/deviations: Step-through pattern;Decreased stride length     General Gait Details: no noted unsteadiness, stopping to  stretch and squat a little with grimace as if hips or low back feels tight; stopping to notice Christmas decorations on unit and humming so played Christmas music for half the session and she participate with occasional words during songs   Stairs             Wheelchair Mobility    Modified Rankin (Stroke Patients Only) Modified Rankin (Stroke Patients Only) Pre-Morbid Rankin Score: No symptoms Modified Rankin: Moderately severe disability     Balance Overall balance assessment: Needs assistance   Sitting balance-Leahy Scale: Good       Standing balance-Leahy Scale: Good Standing balance comment: no LOB with ambulation; some veering to sides when she will turn her head with gestures on command                            Cognition Arousal/Alertness: Awake/alert Behavior During Therapy: WFL for tasks assessed/performed Overall Cognitive Status: Difficult to assess Area of Impairment: Safety/judgement                         Safety/Judgement: Decreased awareness of safety(does have some impulsivity)     General Comments: appropriate when she does understand commands or gestures, she vocalizes "yes" when she is understood and when she understands      Exercises Other Exercises Other Exercises: supine for assisted stretches for hamstrings, gluts and hip rotators x 20 sec hold each leg    General Comments General comments (skin integrity, edema, etc.): VSS with mobility this session      Pertinent Vitals/Pain Pain Assessment: Faces Faces Pain  Scale: Hurts little more Pain Location: head Pain Descriptors / Indicators: Grimacing;Guarding Pain Intervention(s): Monitored during session    Home Living                      Prior Function            PT Goals (current goals can now be found in the care plan section) Progress towards PT goals: Progressing toward goals    Frequency    Min 4X/week      PT Plan Current plan  remains appropriate    Co-evaluation              AM-PAC PT "6 Clicks" Mobility   Outcome Measure  Help needed turning from your back to your side while in a flat bed without using bedrails?: None Help needed moving from lying on your back to sitting on the side of a flat bed without using bedrails?: None Help needed moving to and from a bed to a chair (including a wheelchair)?: A Little Help needed standing up from a chair using your arms (e.g., wheelchair or bedside chair)?: A Little Help needed to walk in hospital room?: A Little Help needed climbing 3-5 steps with a railing? : A Little 6 Click Score: 20    End of Session Equipment Utilized During Treatment: Gait belt Activity Tolerance: Patient tolerated treatment well Patient left: in bed;with call bell/phone within reach;with bed alarm set   PT Visit Diagnosis: Other abnormalities of gait and mobility (R26.89);Other symptoms and signs involving the nervous system (R29.898)     Time: 1545-1610 PT Time Calculation (min) (ACUTE ONLY): 25 min  Charges:  $Gait Training: 8-22 mins $Therapeutic Exercise: 8-22 mins                     Sheran Lawless, [facility] [phone] 08/30/2019    Elray Mcgregor 08/30/2019, 6:14 PM

## 2019-08-30 NOTE — Progress Notes (Signed)
  Speech Language Pathology Treatment: Cognitive-Linquistic  Patient Details Name: BLISS BEHNKE MRN: [id number] DOB: 03/22/1981 Today's Date: 08/30/2019 Time: 3825-0539 SLP Time Calculation (min) (ACUTE ONLY): 38 min  Assessment / Plan / Recommendation Clinical Impression  Treatment session focused on communication goals as pt declined POs offered. Per sister and RN, pt has been selective about what she will consume, but has been swallowing well. Min-Mod cues were given receptively throughout session to increase accuracy with simple yes/no questions and follow one-step commands (when not given within context). She receptively identified family members in pictures on her sister's phone and navigate around the phone with Mod I. Pt was 0% accurate on confrontational naming trials despite cueing, but repeated one word and several sounds. She had the most improved output with automatic speech tasks, counting from 1-10 and stating the days of the week given Mod faded to [place]. Education was provided to pt and her sister about current level of function and strategies/activities were given to pt's sister to facilitate language in between sessions. She would benefit from intensive, CIR-level follow up.    HPI HPI: EMBERLEIGH REILY is a 38 y.o. female with no significant past medical history admitted with right sided weakness and confusion. CT was done that showed a large left temporal ICH with extension into the ventricles. Per chart, pt gave birth August 2020.  She is s/p craniotomy with ventriculostomy.       SLP Plan  Continue with current plan of care       Recommendations  Diet recommendations: Regular;Thin liquid Liquids provided via: Cup;Straw Medication Administration: Whole meds with liquid Supervision: Patient able to self feed;Intermittent supervision to cue for compensatory strategies Compensations: Slow rate;Small sips/bites Postural Changes and/or Swallow Maneuvers: Seated  upright 90 degrees                Oral Care Recommendations: Oral care BID Follow up Recommendations: Inpatient Rehab SLP Visit Diagnosis: Aphasia (R47.01) Plan: Continue with current plan of care       GO                Venita Sheffield Ziyad Dyar 08/30/2019, 3:38 PM  Pollyann Glen, M.A. [place] Acute Environmental education officer [phone] Office [phone]

## 2019-08-30 NOTE — Progress Notes (Signed)
Patient ID: Amanda Small, female   DOB: 09-Feb-1981, 38 y.o.   MRN: [id number] BP (!) 143/83   Pulse 60   Temp 98.4 F (36.9 C) (Oral)   Resp 20   Ht 5\' 8"  (1.727 m)   Wt 86.6 kg   SpO2 98%   BMI 29.03 kg/m  Alert and oriented, aphasic  Head ct shows less blood, will remove drain tomorrow. Wound is clean, dry, no signs of infection

## 2019-08-31 ENCOUNTER — Inpatient Hospital Stay (HOSPITAL_COMMUNITY): Payer: 59

## 2019-08-31 ENCOUNTER — Encounter (HOSPITAL_COMMUNITY): Payer: 59

## 2019-08-31 HISTORY — PX: IR ANGIO VERTEBRAL SEL VERTEBRAL UNI L MOD SED: IMG5367

## 2019-08-31 HISTORY — PX: IR ANGIO INTRA EXTRACRAN SEL INTERNAL CAROTID BILAT MOD SED: IMG5363

## 2019-08-31 LAB — LUPUS ANTICOAGULANT PANEL
DRVVT: 34 s (ref 0.0–47.0)
PTT Lupus Anticoagulant: 27.7 s (ref 0.0–51.9)

## 2019-08-31 LAB — PROTEIN C, TOTAL: Protein C, Total: 109 % (ref 60–150)

## 2019-08-31 LAB — HOMOCYSTEINE: Homocysteine: 8.7 umol/L (ref 0.0–14.5)

## 2019-08-31 LAB — PROTEIN S ACTIVITY: Protein S Activity: 60 % — ABNORMAL LOW (ref 63–140)

## 2019-08-31 LAB — PROTEIN S, TOTAL: Protein S Ag, Total: 84 % (ref 60–150)

## 2019-08-31 LAB — PROTEIN C ACTIVITY: Protein C Activity: 129 % (ref 73–180)

## 2019-08-31 MED ORDER — IOHEXOL 300 MG/ML  SOLN
100.0000 mL | Freq: Once | INTRAMUSCULAR | Status: AC | PRN
  Administered 2019-08-31: 24 mL via INTRA_ARTERIAL

## 2019-08-31 MED ORDER — LIDOCAINE HCL (PF) 1 % IJ SOLN
INTRAMUSCULAR | Status: AC | PRN
  Administered 2019-08-31: 10 mL

## 2019-08-31 MED ORDER — FENTANYL CITRATE (PF) 100 MCG/2ML IJ SOLN
INTRAMUSCULAR | Status: AC
  Filled 2019-08-31: qty 2

## 2019-08-31 MED ORDER — HEPARIN SODIUM (PORCINE) 1000 UNIT/ML IJ SOLN
INTRAMUSCULAR | Status: AC
  Filled 2019-08-31: qty 1

## 2019-08-31 MED ORDER — LIDOCAINE HCL 1 % IJ SOLN
INTRAMUSCULAR | Status: AC
  Filled 2019-08-31: qty 20

## 2019-08-31 MED ORDER — FENTANYL CITRATE (PF) 100 MCG/2ML IJ SOLN
INTRAMUSCULAR | Status: AC | PRN
  Administered 2019-08-31: 50 ug via INTRAVENOUS
  Administered 2019-08-31: 25 ug via INTRAVENOUS

## 2019-08-31 NOTE — Progress Notes (Addendum)
Physical Therapy Treatment Patient Details Name: Amanda Small MRN: [id number] DOB: 10/22/1980 Today's Date: 08/31/2019    History of Present Illness Pt is a 38 year old woman with unheralded onset of headache and inability to use her right side. CT revealed large left temporal ICH with intraventricular extension. Pt was intubated for airway protection. PT s/p left temporal craniotomy for hematoma evacuation and right frontal ventricular catheter placement. No pertinent history.    PT Comments    Pt's mobility improving well, but pt still has significant limitations stemming from expressive > receptive aphasias, awareness of her environment and problem-solving through ADL tasks and other skills that limit her ability to be alone in a home environment.   Follow Up Recommendations  CIR;Supervision/Assistance - 24 hour     Equipment Recommendations  None recommended by PT    Recommendations for Other Services Rehab consult     Precautions / Restrictions Precautions Precautions: Fall Precaution Comments: EVD removed    Mobility  Bed Mobility Overal bed mobility: Needs Assistance Bed Mobility: Supine to Sit;Sit to Supine     Supine to sit: Supervision Sit to supine: Supervision   General bed mobility comments: supervision due to safety concerns and impulsivity.  Transfers Overall transfer level: Needs assistance Equipment used: None Transfers: Sit to/from Stand Sit to Stand: Supervision         General transfer comment: supervision for safety concerns and impulsivities.  Ambulation/Gait Ambulation/Gait assistance: Min guard Gait Distance (Feet): 400 Feet Assistive device: None Gait Pattern/deviations: Step-through pattern Gait velocity: decreased   General Gait Details: pt was steady overall in a homelike enviroment.  She was distracted by her HA and had to be directed to continue to find her way back to her room.  Cues were consistently given to redirect to  task.  Pt non verbalized "I don't know frequently" and then she was cued to room numbers, new directions.   Stairs Stairs: Yes Stairs assistance: Min guard Stair Management: One rail Right;Alternating pattern;Forwards Number of Stairs: 4 General stair comments: safe with rails   Wheelchair Mobility    Modified Rankin (Stroke Patients Only) Modified Rankin (Stroke Patients Only) Modified Rankin: Moderate disability     Balance Overall balance assessment: Needs assistance Sitting-balance support: No upper extremity supported Sitting balance-Leahy Scale: Good     Standing balance support: During functional activity Standing balance-Leahy Scale: Good                              Cognition Arousal/Alertness: Awake/alert Behavior During Therapy: WFL for tasks assessed/performed Overall Cognitive Status: Difficult to assess Area of Impairment: Attention;Safety/judgement;Awareness;Problem solving                   Current Attention Level: Sustained Memory: Decreased recall of precautions Following Commands: Follows one step commands with increased time Safety/Judgement: Decreased awareness of safety Awareness: Intellectual Problem Solving: Slow processing;Requires verbal cues        Exercises      General Comments General comments (skin integrity, edema, etc.): Mobility appropriate, but pt still has problem solving and awareness difficulties.      Pertinent Vitals/Pain Pain Assessment: Faces Faces Pain Scale: Hurts little more Pain Location: head Pain Descriptors / Indicators: Grimacing;Guarding;Moaning Pain Intervention(s): Monitored during session    Home Living                      Prior Function  PT Goals (current goals can now be found in the care plan section) Acute Rehab PT Goals PT Goal Formulation: Patient unable to participate in goal setting Time For Goal Achievement: 09/10/19 Potential to Achieve Goals:  Good Progress towards PT goals: Progressing toward goals    Frequency    Min 4X/week      PT Plan Current plan remains appropriate    Co-evaluation              AM-PAC PT "6 Clicks" Mobility   Outcome Measure  Help needed turning from your back to your side while in a flat bed without using bedrails?: None Help needed moving from lying on your back to sitting on the side of a flat bed without using bedrails?: None Help needed moving to and from a bed to a chair (including a wheelchair)?: None Help needed standing up from a chair using your arms (e.g., wheelchair or bedside chair)?: None Help needed to walk in hospital room?: A Little Help needed climbing 3-5 steps with a railing? : A Little 6 Click Score: 22    End of Session   Activity Tolerance: Patient tolerated treatment well Patient left: in bed;with call bell/phone within reach;with bed alarm set Nurse Communication: Mobility status PT Visit Diagnosis: Other abnormalities of gait and mobility (R26.89);Other symptoms and signs involving the nervous system (R29.898)     Time: 8811-0315 PT Time Calculation (min) (ACUTE ONLY): 14 min  Charges:  $Gait Training: 8-22 mins                     08/31/2019  Farley Bing, PT Acute Rehabilitation Services [phone]  (pager) [phone]  (office)   Eliseo Gum Mahreen Schewe 08/31/2019, 1:07 PM

## 2019-08-31 NOTE — Progress Notes (Signed)
OT Cancellation Note  Patient Details Name: Amanda Small MRN: [id number] DOB: 12/07/1980   Cancelled Treatment:    Reason Eval/Treat Not Completed: Patient at procedure or test/ unavailable Attempted to meet with pt when pt in angio. Will continue to follow as available and appropriate.  Zenovia Jarred, MSOT, OTR/L Behavioral Health OT/ Acute Relief OT [facility] Office: [phone]  Zenovia Jarred 08/31/2019, 4:19 PM

## 2019-08-31 NOTE — Progress Notes (Signed)
Inpatient Rehab Admissions Coordinator:   Reviewed pt's chart.  Note pt now min guard x400' for mobility.  Still demonstrates significant deficits with expressive aphasia and still with ventriculostomy drain.  Based on patient's ongoing language deficits, she will need 24/7 supervision at home, which her family is already prepared to provide.  As she is already min guard she no longer will need CIR level therapies at d/c. I've spoken to pt and family to let them know and they are in agreement.  Will sign off at this time.   Shann Medal, PT, DPT Admissions Coordinator [phone] 08/31/19  11:48 AM

## 2019-08-31 NOTE — Sedation Documentation (Signed)
Fentanyl only case due to patient eating.

## 2019-08-31 NOTE — Progress Notes (Signed)
Patient ID: Amanda Small, female   DOB: [birth year], 38 y.o.   MRN: [id number] BP 129/77   Pulse (!) 47   Temp 98.3 F (36.8 C) (Oral)   Resp 11   Ht 5\' 8"  (1.727 m)   Wt 86.6 kg   SpO2 96%   BMI 29.03 kg/m  Alert, spontaneous speech, understandable Remains aphasic Wound is clean, dry, and without signs of infection Ventricular catheter removed without difficulty.  At baseline

## 2019-08-31 NOTE — Brief Op Note (Signed)
  NEUROSURGERY BRIEF OPERATIVE  NOTE   PREOP DX: Left temporal hemorrhage  POSTOP DX: Same  PROCEDURE: Diagnostic cerebral angiogram  SURGEON: Dr. Consuella Lose, MD  ANESTHESIA: IV Sedation with Local  EBL: Minimal  SPECIMENS: None  COMPLICATIONS: None  CONDITION: Stable to recovery  FINDINGS (Full report in [facility]): 1. Findings suggestive of thrombosis of the left vein of Trolard 2. No aneurysms, AVMs, or fistulas are seen

## 2019-08-31 NOTE — Sedation Documentation (Signed)
Patient transported to 4N29. Bedside report given to [facility], Therapist, sports. Groin site check, pulses and orders all reviewed with RN.

## 2019-08-31 NOTE — Progress Notes (Signed)
STROKE TEAM PROGRESS NOTE   INTERVAL HISTORY  patient is sitting up in bed.  She continues to have expressive aphasia but does communicate with sign language and has good comprehension.  Ventriculostomy catheter was discontinued this morning and patient seems to be doing well without any neuro change.  Diagnostic cerebral catheter angiogram is pending for later this afternoon per Dr. Kathyrn Sheriff  OBJECTIVE Vitals:   08/31/19 0500 08/31/19 0600 08/31/19 0749 08/31/19 0810  BP: 135/85 123/79  122/75  Pulse: (!) 49 (!) 49  (!) 49  Resp: (!) 21 19  19   Temp:   98.2 F (36.8 C)   TempSrc:   Oral   SpO2: 97% 98%  99%  Weight:      Height:        CBC:  Recent Labs  Lab 08/25/19 0608 08/28/19 0631 08/29/19 0520  WBC 9.6 9.4 8.7  NEUTROABS 5.4  --   --   HGB 12.0 10.1* 10.3*  HCT 38.2 32.4* 32.7*  MCV 94.1 93.6 93.7  PLT 217 192 947    Basic Metabolic Panel:  Recent Labs  Lab 08/28/19 0631 08/29/19 0520  NA 145 143  K 3.4* 3.5  CL 110 108  CO2 25 24  GLUCOSE 100* 96  BUN 11 10  CREATININE 0.74 0.69  CALCIUM 9.1 8.9    Lipid Panel:     Component Value Date/Time   CHOL 211 (H) 08/25/2019 1139   TRIG 153 (H) 08/25/2019 1139   TRIG 153 (H) 08/25/2019 1139   HDL 56 08/25/2019 1139   CHOLHDL 3.8 08/25/2019 1139   VLDL 31 08/25/2019 1139   LDLCALC 124 (H) 08/25/2019 1139   HgbA1c:  Lab Results  Component Value Date   HGBA1C 4.7 (L) 08/25/2019   Urine Drug Screen:     Component Value Date/Time   LABOPIA NONE DETECTED 08/25/2019 0613   COCAINSCRNUR NONE DETECTED 08/25/2019 0613   LABBENZ NONE DETECTED 08/25/2019 0613   AMPHETMU NONE DETECTED 08/25/2019 0613   THCU POSITIVE (A) 08/25/2019 0613   LABBARB NONE DETECTED 08/25/2019 0613    Alcohol Level     Component Value Date/Time   ETH <10 08/25/2019 0608    IMAGING   Ct Head Wo Contrast 08/30/2019 1. Unchanged left temporal and left lateral intraventricular hemorrhage. No hydrocephalus. 2. Linear  high-density along the left frontal convexity could be postoperative blood products but is notably tubular and could also be a thrombosed vein of Trolard. Consider venous imaging.   CT Head Wo Contrast 08/26/2019 Stable since yesterday. No new or worsening finding. Status post evacuation of a left temporoparietal intraparenchymal hematoma.  No evidence of recurrent bleeding. Residual stable hemorrhage as described above.  Mild mass effect with left-to-right shift of 4 mm.  Ct Head Wo Contrast 08/25/2019 1. Postoperative changes from interval left craniotomy and left temporoparietal parenchymal hematoma evacuation. Persistent hemorrhage in the left temporal region measures 2.6 x 1.4 x 2.7 cm, and may be within a dilated left temporal horn or parenchymal and effacing the left temporal.  2. Persistent large volume hemorrhage within the left lateral ventricle. Small amount of hemorrhage also present within the third and now fourth ventricles.  3. Interval placement of a right frontal approach ventricular catheter terminating near the right foramen of Monro. The right lateral, third and fourth ventricles do not appear dilated.  4. Small volume subarachnoid hemorrhage overlying the left cerebral hemisphere.  5. Decreased mass effect with interval decrease in rightward midline shift, now 5 mm.  Ct Angio Head W Or Wo Contrast Ct Angio Neck W Or Wo Contrast 08/25/2019 1. Normal variant CTA Circle of Willis without significant proximal stenosis, aneurysm, or branch vessel occlusion. No acute or focal lesion to explain the left temporal lobe hemorrhage.  2. Normal CTA of the neck.  3. Endotracheal tube terminates 2 cm above the carina and could be pulled back 1-2 cm for more optimal positioning.   Ct Head Wo Contrast 08/25/2019 1. Large left temporal and parietal hematoma measures 7.8 x 3.3 x 3.0 cm.  2. Intraventricular extension of hemorrhage.  3. Diffuse left hemispheric mass effect with midline  shift measuring 8 mm.   Dg Chest Port 1 View 08/25/2019 ETT in good position. OG tube courses into the stomach and below the film. No acute disease.  08/25/2019 No acute cardiopulmonary findings.   2D Echocardiogram 1. Left ventricular ejection fraction, by visual estimation, is 60 to 65%. The left ventricle has normal function. There is no left ventricular hypertrophy.  2. The left ventricle has no regional wall motion abnormalities.  3. Global right ventricle has normal systolic function.The right ventricular size is normal. No increase in right ventricular wall thickness.  4. Left atrial size was normal.  5. Right atrial size was normal.  6. The mitral valve is normal in structure. Trace mitral valve regurgitation. No evidence of mitral stenosis.  7. The tricuspid valve is normal in structure. Tricuspid valve regurgitation is not demonstrated.  8. The aortic valve is normal in structure. Aortic valve regurgitation is not visualized. No evidence of aortic valve sclerosis or stenosis.  9. The pulmonic valve was normal in structure. Pulmonic valve regurgitation is not visualized. 10. The inferior vena cava is normal in size with <50% respiratory variability, suggesting right atrial pressure of 8 mmHg.  ECG - SR rate 60 BPM. (See cardiology reading for complete details)   PHYSICAL EXAM     Pleasant middle-aged African-American lady not in distress.  . Afebrile. Head is nontraumatic. Neck is supple without bruit.    Cardiac exam no murmur or gallop. Lungs are clear to auscultation. Distal pulses are well felt.  She has ventriculostomy catheter on the right. Neurological Exam : Awake alert restless.  Moderate expressive aphasia.Marland Kitchen.  Speaks a few words and occasional short sentences.  Follows simple midline and one-step commands only.  Left gaze preference but will able to look to the right.  Blinks to threat on the left but not as well on the right.  Right lower facial weakness.  Tongue midline.   Able to withdraw all 4 extremities against gravity but will not cooperate for detailed exam.  Gait deferred   ASSESSMENT/PLAN [name] is a 38 y.o. female with history of recent pregnancy with delivery in August presenting with HA, AMS, right sided weakness, and left gaze preference. She did not receive IV t-PA due to hemorrhage. Left temporal craniotomy and placement of EVD catheter 08/25/19. Will need cerebral angiogram at some point - Dr Conchita ParisNundkumar.  Stroke: Large left temporal and parietal ICH w/ IVH s/p crani w/ evacuation and EVD placement- unknown etiology ?  Vein of Trolard thrombosis-unusual for it to occur nearly 3 months after her pregnancy  CT head - 08/25/19 - Large left temporal and parietal hematoma. IVH. Diffuse left hemispheric mass effect with midline shift measuring 8 mm.   Repeat CTH - 08/25/19 - Postoperative L craniotomy and L ICH evacuation. Persistent hemorrhage L temporal region. Persistent large L lateral ventricle. IVH in  third and now fourth ventricles. Ventricles not dilated. Small L cerebral SAH . Decreased mass effect with R midline shift, now 5 mm.  CTA head and neck Unremarkable   CTH - 08/26/19 - Stable. Mild mass effect with left-to-right shift of 4 mm.  Repeat CT 12/9 unchanged L temporal lobe and L lateral IVH. No hydro. Linear density L frontal convexity.   2D Echo - EF 60-65%. No source of embolus    [employer] Virus 2 - negative  LDL - 124  HgbA1c - 4.7  Hypercoagulable labs pending    UDS - THCU  VTE prophylaxis - SCDs  No antithrombotic prior to admission, now on No antithrombotic  Therapy recommendations:  CIR  Disposition:  Pending  Left temporal craniotomy and placement of EVD catheter 08/25/19 - Dr Franky Macho  on Keppra  EVD draining, pt tolerating well  Repeat CT 12/9 unchanged L temporal lobe and L lateral IVH. No hydro. Linear density L frontal convexity ? Thrombosed vein.   EVD removed 12/10  For cerebral angiogram  possibly today by Dr. Conchita Paris  Blood Pressure  Home BP meds: none, no hx HTN  Current BP meds: Labetalol prn -> change to hydralazine prn SBP > 160   Blood pressure runs low - SBP ~ 110-130s   SBP goal < 160 mm Hg   . Long-term BP goal normotensive  Hyperlipidemia  Home Lipid lowering medication: none  LDL 124, goal < 70   Current lipid lowering medication: None (statin contraindicated with ICH)  Consider statin at discharge  Dysphagia, resolved . Secondary to stroke . on IVF at 60h as breast feeding . Speech on board . Now on Vegetarian diet w/ thin liquids   Other Stroke Risk Factors  Obesity, Body mass index is 29.03 kg/m., recommend weight loss, diet and exercise as appropriate   THC use  Other Active Problems  Recent pregnancy with delivery in August, breast feeding/pumping  Temp 99.1->98.9 - IV Ancef started 08/25/19 - WBC 12.9->9.4->8.7  Hypokalemia - 3.1 - supplement and recheck - 3.4 - recheck  - 3.5  Bradycardia - 30's - 08/26/19 - 9:37 pm - atropine per eLink - currently not on meds that should cause bradycardia except prn Labetalol - changed to hydralazine - monitor - remains in the 50s  Hospital day # 6 Continue speech physical and Occupational Therapy.  Mobilize out of bed.  Diagnostic cerebral catheter angiogram by Dr. Conchita Paris later today to confirm vein of the LAD thrombosis or alternative etiology.  Given significant amount of intraventricular and parenchymal blood it would be risky to start anticoagulation at this time and will continue aspirin for now but she may need anticoagulation in the near future.  Await hypercoagulable panel labs. Discussed with Dr. Mikal Plane and Conchita Paris from neurosurgery This patient is critically ill and at significant risk of neurological worsening, death and care requires constant monitoring of vital signs, hemodynamics,respiratory and cardiac monitoring, extensive review of multiple databases, frequent neurological  assessment, discussion with family, other specialists and medical decision making of high complexity.I have made any additions or clarifications directly to the above note.This critical care time does not reflect procedure time, or teaching time or supervisory time of PA/NP/Med Resident etc but could involve care discussion time. I spent 30 minutes of neurocritical care time  in the care of  this patient.  Delia Heady, MD  To contact Stroke Continuity provider, please refer to [email or web address]. After hours, contact General Neurology

## 2019-09-01 ENCOUNTER — Inpatient Hospital Stay (HOSPITAL_COMMUNITY): Payer: 59

## 2019-09-01 DIAGNOSIS — I639 Cerebral infarction, unspecified: Secondary | ICD-10-CM

## 2019-09-01 LAB — CBC
HCT: 33.3 % — ABNORMAL LOW (ref 36.0–46.0)
Hemoglobin: 10.7 g/dL — ABNORMAL LOW (ref 12.0–15.0)
MCH: 29.7 pg (ref 26.0–34.0)
MCHC: 32.1 g/dL (ref 30.0–36.0)
MCV: 92.5 fL (ref 80.0–100.0)
Platelets: 281 10*3/uL (ref 150–400)
RBC: 3.6 MIL/uL — ABNORMAL LOW (ref 3.87–5.11)
RDW: 12.9 % (ref 11.5–15.5)
WBC: 9.6 10*3/uL (ref 4.0–10.5)
nRBC: 0 % (ref 0.0–0.2)

## 2019-09-01 LAB — SURGICAL PATHOLOGY

## 2019-09-01 LAB — BASIC METABOLIC PANEL
Anion gap: 10 (ref 5–15)
BUN: 5 mg/dL — ABNORMAL LOW (ref 6–20)
CO2: 24 mmol/L (ref 22–32)
Calcium: 9.2 mg/dL (ref 8.9–10.3)
Chloride: 107 mmol/L (ref 98–111)
Creatinine, Ser: 0.63 mg/dL (ref 0.44–1.00)
GFR calc Af Amer: >60 mL/min (ref >60)
GFR calc non Af Amer: >60 mL/min (ref >60)
Glucose, Bld: 112 mg/dL — ABNORMAL HIGH (ref 70–99)
Potassium: 3.6 mmol/L (ref 3.5–5.1)
Sodium: 141 mmol/L (ref 135–145)

## 2019-09-01 LAB — CARDIOLIPIN ANTIBODIES, IGG, IGM, IGA
Anticardiolipin IgA: <9 APL U/mL (ref 0–11)
Anticardiolipin IgG: <9 GPL U/mL (ref 0–14)
Anticardiolipin IgM: 10 MPL U/mL (ref 0–12)

## 2019-09-01 LAB — BETA-2-GLYCOPROTEIN I ABS, IGG/M/A
Beta-2 Glyco I IgG: <9 GPI IgG units (ref 0–20)
Beta-2-Glycoprotein I IgA: <9 GPI IgA units (ref 0–25)
Beta-2-Glycoprotein I IgM: <9 GPI IgM units (ref 0–32)

## 2019-09-01 MED ORDER — ASPIRIN EC 81 MG PO TBEC
81.0000 mg | DELAYED_RELEASE_TABLET | Freq: Every day | ORAL | Status: DC
  Administered 2019-09-01 – 2019-09-05 (×5): 81 mg via ORAL
  Filled 2019-09-01 (×5): qty 1

## 2019-09-01 MED ORDER — LEVETIRACETAM 500 MG PO TABS
500.0000 mg | ORAL_TABLET | Freq: Two times a day (BID) | ORAL | Status: DC
  Administered 2019-09-01 – 2019-09-05 (×8): 500 mg via ORAL
  Filled 2019-09-01 (×8): qty 1

## 2019-09-01 NOTE — Progress Notes (Signed)
Lower extremity venous has been completed.   Preliminary results in CV Proc.   Abram Sander 09/01/2019 10:31 AM

## 2019-09-01 NOTE — Progress Notes (Signed)
Patient ID: Amanda Small, female   DOB: 24-Jul-1981, 38 y.o.   MRN: [id number] BP 116/64   Pulse (!) 55   Temp 98.6 F (37 C) (Oral)   Resp 19   Ht 5\' 8"  (1.727 m)   Wt 86.6 kg   SpO2 99%   BMI 29.03 kg/m   No regression without catheter.  Wound is clean, dry Doing well

## 2019-09-01 NOTE — Progress Notes (Addendum)
STROKE TEAM PROGRESS NOTE   INTERVAL HISTORY  She is sitting up in bed.  She continues to have expressive aphasia Diagnostic cerebral catheter angiogram by Dr. Conchita Paris yesterday showed no evidence of aneurysm AVM or AV fistula but she did show prominent thrombosed vein on the left convexity likely thrombosed vein of Trolard and I am not sure of this is the etiology of her hemorrhage or secondary effect of her craniotomy  OBJECTIVE Vitals:   09/01/19 0300 09/01/19 0400 09/01/19 0500 09/01/19 0600  BP: 108/60 (!) 92/54 (!) 97/54 100/62  Pulse: (!) 49 (!) 59 (!) 51 (!) 45  Resp: 20 18 15 19   Temp:  98.4 F (36.9 C)    TempSrc:  Oral    SpO2: 99% 98% 100% 98%  Weight:      Height:        CBC:  Recent Labs  Lab 08/28/19 0631 08/29/19 0520  WBC 9.4 8.7  HGB 10.1* 10.3*  HCT 32.4* 32.7*  MCV 93.6 93.7  PLT 192 207    Basic Metabolic Panel:  Recent Labs  Lab 08/28/19 0631 08/29/19 0520  NA 145 143  K 3.4* 3.5  CL 110 108  CO2 25 24  GLUCOSE 100* 96  BUN 11 10  CREATININE 0.74 0.69  CALCIUM 9.1 8.9    Lipid Panel:     Component Value Date/Time   CHOL 211 (H) 08/25/2019 1139   TRIG 153 (H) 08/25/2019 1139   TRIG 153 (H) 08/25/2019 1139   HDL 56 08/25/2019 1139   CHOLHDL 3.8 08/25/2019 1139   VLDL 31 08/25/2019 1139   LDLCALC 124 (H) 08/25/2019 1139   HgbA1c:  Lab Results  Component Value Date   HGBA1C 4.7 (L) 08/25/2019   Urine Drug Screen:     Component Value Date/Time   LABOPIA NONE DETECTED 08/25/2019 0613   COCAINSCRNUR NONE DETECTED 08/25/2019 0613   LABBENZ NONE DETECTED 08/25/2019 0613   AMPHETMU NONE DETECTED 08/25/2019 0613   THCU POSITIVE (A) 08/25/2019 0613   LABBARB NONE DETECTED 08/25/2019 0613    Alcohol Level     Component Value Date/Time   ETH <10 08/25/2019 0608    IMAGING   Ct Head Wo Contrast 08/30/2019 1. Unchanged left temporal and left lateral intraventricular hemorrhage. No hydrocephalus. 2. Linear high-density along the  left frontal convexity could be postoperative blood products but is notably tubular and could also be a thrombosed vein of Trolard. Consider venous imaging.   CT Head Wo Contrast 08/26/2019 Stable since yesterday. No new or worsening finding. Status post evacuation of a left temporoparietal intraparenchymal hematoma.  No evidence of recurrent bleeding. Residual stable hemorrhage as described above.  Mild mass effect with left-to-right shift of 4 mm.  Ct Head Wo Contrast 08/25/2019 1. Postoperative changes from interval left craniotomy and left temporoparietal parenchymal hematoma evacuation. Persistent hemorrhage in the left temporal region measures 2.6 x 1.4 x 2.7 cm, and may be within a dilated left temporal horn or parenchymal and effacing the left temporal.  2. Persistent large volume hemorrhage within the left lateral ventricle. Small amount of hemorrhage also present within the third and now fourth ventricles.  3. Interval placement of a right frontal approach ventricular catheter terminating near the right foramen of Monro. The right lateral, third and fourth ventricles do not appear dilated.  4. Small volume subarachnoid hemorrhage overlying the left cerebral hemisphere.  5. Decreased mass effect with interval decrease in rightward midline shift, now 5 mm.   Ct Angio  Head W Or Wo Contrast Ct Angio Neck W Or Wo Contrast 08/25/2019 1. Normal variant CTA Circle of Willis without significant proximal stenosis, aneurysm, or branch vessel occlusion. No acute or focal lesion to explain the left temporal lobe hemorrhage.  2. Normal CTA of the neck.  3. Endotracheal tube terminates 2 cm above the carina and could be pulled back 1-2 cm for more optimal positioning.   Ct Head Wo Contrast 08/25/2019 1. Large left temporal and parietal hematoma measures 7.8 x 3.3 x 3.0 cm.  2. Intraventricular extension of hemorrhage.  3. Diffuse left hemispheric mass effect with midline shift measuring 8 mm.    Diagnostic cerebral catheter angiogram: 08/31/2019 :suggestive of thrombosis of the left vein of Trolard   The dural venous sinuses remain widely patent. There are no intracranial aneurysms, arteriovenous malformations, or high-flow fistulas identified  Dg Chest Port 1 View 08/25/2019 ETT in good position. OG tube courses into the stomach and below the film. No acute disease.  08/25/2019 No acute cardiopulmonary findings.   2D Echocardiogram 1. Left ventricular ejection fraction, by visual estimation, is 60 to 65%. The left ventricle has normal function. There is no left ventricular hypertrophy.  2. The left ventricle has no regional wall motion abnormalities.  3. Global right ventricle has normal systolic function.The right ventricular size is normal. No increase in right ventricular wall thickness.  4. Left atrial size was normal.  5. Right atrial size was normal.  6. The mitral valve is normal in structure. Trace mitral valve regurgitation. No evidence of mitral stenosis.  7. The tricuspid valve is normal in structure. Tricuspid valve regurgitation is not demonstrated.  8. The aortic valve is normal in structure. Aortic valve regurgitation is not visualized. No evidence of aortic valve sclerosis or stenosis.  9. The pulmonic valve was normal in structure. Pulmonic valve regurgitation is not visualized. 10. The inferior vena cava is normal in size with <50% respiratory variability, suggesting right atrial pressure of 8 mmHg.  ECG - SR rate 60 BPM. (See cardiology reading for complete details)   PHYSICAL EXAM     Pleasant middle-aged African-American lady not in distress.  . Afebrile. Head is nontraumatic. Neck is supple without bruit.    Cardiac exam no murmur or gallop. Lungs are clear to auscultation. Distal pulses are well felt.  She has ventriculostomy catheter on the right. Neurological Exam : Awake alert restless.  Moderate expressive aphasia.Marland Kitchen  Speaks a few words and occasional  short sentences.  Follows simple midline and one-step commands only.  Left gaze preference but will able to look to the right.  Blinks to threat on the left but not as well on the right.  Right lower facial weakness.  Tongue midline.  Able to withdraw all 4 extremities against gravity but will not cooperate for detailed exam.  Gait deferred   ASSESSMENT/PLAN Ms. Giuliana CREE NAPOLI is a 38 y.o. female with history of recent pregnancy with delivery in August presenting with HA, AMS, right sided weakness, and left gaze preference. She did not receive IV t-PA due to hemorrhage. Left temporal craniotomy and placement of EVD catheter 08/25/19. Will need cerebral angiogram at some point - Dr Kathyrn Sheriff.  Stroke: Large left temporal and parietal ICH w/ IVH s/p crani w/ evacuation and EVD placement- unknown etiology ?  Vein of Trolard thrombosis-unusual for it to occur nearly 3 months after her pregnancy  CT head - 08/25/19 - Large left temporal and parietal hematoma. IVH. Diffuse left hemispheric mass  effect with midline shift measuring 8 mm.   Repeat CTH - 08/25/19 - Postoperative L craniotomy and L ICH evacuation. Persistent hemorrhage L temporal region. Persistent large L lateral ventricle. IVH in third and now fourth ventricles. Ventricles not dilated. Small L cerebral SAH . Decreased mass effect with R midline shift, now 5 mm.  CTA head and neck Unremarkable   CTH - 08/26/19 - Stable. Mild mass effect with left-to-right shift of 4 mm.  Repeat CT 12/9 unchanged L temporal lobe and L lateral IVH. No hydro. Linear density L frontal convexity.   2D Echo - EF 60-65%. No source of embolus    [employer] Virus 2 - negative  LDL - 124  HgbA1c - 4.7  UDS - THCU  VTE prophylaxis - SCDs  No antithrombotic prior to admission, now on No antithrombotic  Therapy recommendations:  CIR planned  Disposition:  Pending  Left temporal craniotomy and placement of EVD catheter 08/25/19 - Dr Franky Macho  On  Keppra  EVD draining, pt tolerating well  Repeat CT 12/9 unchanged L temporal lobe and L lateral IVH. No hydro. Linear density L frontal convexity ? Thrombosed vein.   EVD removed 12/10  Cerebral angiogram 08/31/19 - Dr. Conchita Paris - suggestive of thrombosis of the left vein of Trolard as described above. The dural venous sinuses remain widely patent. There are no intracranial aneurysms, arteriovenous malformations, or high-flow fistulas identified.  Blood Pressure   Home BP meds: none, no hx HTN  Current BP meds: Labetalol prn -> change to hydralazine prn SBP > 160   Blood pressure runs low - SBP ~ 90's - 100's  - mildly hypotensive  SBP goal < 160 mm Hg   . Long-term BP goal normotensive  Hypercoagulable labs - pending   Protein S Activity - 60 (L)  Hyperlipidemia  Home Lipid lowering medication: none  LDL 124, goal < 70   Current lipid lowering medication: None (statin contraindicated with ICH)  Consider statin at discharge  Dysphagia, resolved . Secondary to stroke . on IVF at 60h as breast feeding . Speech on board . Now on Vegetarian diet w/ thin liquids   Other Stroke Risk Factors  Obesity, Body mass index is 29.03 kg/m., recommend weight loss, diet and exercise as appropriate   THC use  Other Active Problems  Recent pregnancy with delivery in August, breast feeding/pumping  Temp 99.1->98.9->98.4 - IV Ancef started 08/25/19 (day # 8) - WBC 12.9->9.4->8.7  Hypokalemia - 3.1->3.4->3.5 - Bmet - pending  Bradycardia - 30's - 08/26/19 - 9:37 pm - atropine per eLink - currently not on meds that should cause bradycardia except prn Labetalol - changed to hydralazine - monitor - remains in the 50s  Anemia - Hb - 10.1->10.3 - recheck in AM  Mildly hypotensive  Full Code  PLAN  Given significant amount of intraventricular and parenchymal blood it would be risky to start anticoagulation at this time and will continue aspirin for now but she may need  anticoagulation in the near future only if she has a hypercoagulable disorder-showed isolated vein of Trolard thrombosis in 3 months postpartum situation with significant amount of intraventricular and parenchymal hemorrhage is indication for anticoagulation  Hospital day # 7 Continue speech physical and Occupational Therapy.  Mobilize out of bed.  Mobilize out of bed and transfer to neurology floor bed.  Cerebral angiogram shows thrombosed vein of Trolard but no evidence of venous sinus thrombosis involving the large veins so I am not sure whether this  is a secondary effect of craniotomy or the primary etiology of her hemorrhage.  Given significant amount of intraventricular and parenchymal blood it would be risky to start anticoagulation at this time and will continue aspirin for now but she may need anticoagulation in the near future.  Await hypercoagulable panel labs. Patient is not considered a candidate for inpatient rehab as most of her needs are from cognitive and speech standpoint.  I spoke to the patient's sister over the phone and she does not have the family support to take care of her at home hence transfer for rehabilitation to skilled nursing facility seems like his only option.  Will consult case manager for nursing home  This patient is critically ill and at significant risk of neurological worsening, death and care requires constant monitoring of vital signs, hemodynamics,respiratory and cardiac monitoring, extensive review of multiple databases, frequent neurological assessment, discussion with family, other specialists and medical decision making of high complexity.I have made any additions or clarifications directly to the above note.This critical care time does not reflect procedure time, or teaching time or supervisory time of PA/NP/Med Resident etc but could involve care discussion time. I spent 30 minutes of neurocritical care time  in the care of  this patient.  Delia HeadyPramod Lujean Ebright,  MD  To contact Stroke Continuity provider, please refer to [email or web address]. After hours, contact General Neurology

## 2019-09-01 NOTE — Progress Notes (Signed)
  Speech Language Pathology Treatment: Dysphagia;Cognitive-Linquistic  Patient Details Name: Amanda Small MRN: [id number] DOB: 28-Jan-1981 Today's Date: 09/01/2019 Time: 0037-0488 SLP Time Calculation (min) (ACUTE ONLY): 22 min  Assessment / Plan / Recommendation Clinical Impression  Pt was seen with regular solids and thin liquids with encouragement needed for intake but no overt s/s of dysphagia nor aspiration noted. Skilled observation provided and education completed. No further dysphagia f/u indicated at this time.   Pt continues to have significant expressive difficulties and more mild receptive language impairments. She receptively identified items on her meal tray with Min cues. Pt repeated sounds in isolation >75% of the time as well as CV or VC words. She attempted repeating CVC words and could repeat the individual sounds, but not all of them combined despite cues and attempted blending from SLP. Pt puts forth a lot of effort and seems to have awareness of her errors. Her sister was present for therapy again and is also very supportive. She would benefit from intensive SLP f/u to maximize functional communication.    HPI HPI: Amanda Small is a 38 y.o. female with no significant past medical history admitted with right sided weakness and confusion. CT was done that showed a large left temporal ICH with extension into the ventricles. Per chart, pt gave birth August 2020.  She is s/p craniotomy with ventriculostomy.       SLP Plan  Goals updated       Recommendations  Diet recommendations: Regular;Thin liquid Liquids provided via: Cup;Straw Medication Administration: Whole meds with liquid Supervision: Patient able to self feed;Intermittent supervision to cue for compensatory strategies Compensations: Slow rate;Small sips/bites Postural Changes and/or Swallow Maneuvers: Seated upright 90 degrees                Oral Care Recommendations: Oral care BID Follow up  Recommendations: Inpatient Rehab SLP Visit Diagnosis: Aphasia (R47.01) Plan: Goals updated       [place] Eloise Mula 09/01/2019, 3:25 PM  Pollyann Glen, M.A. [facility] Acute Environmental education officer [phone] Office [phone]

## 2019-09-02 DIAGNOSIS — E876 Hypokalemia: Secondary | ICD-10-CM

## 2019-09-02 DIAGNOSIS — G936 Cerebral edema: Secondary | ICD-10-CM

## 2019-09-02 DIAGNOSIS — G911 Obstructive hydrocephalus: Secondary | ICD-10-CM

## 2019-09-02 DIAGNOSIS — I615 Nontraumatic intracerebral hemorrhage, intraventricular: Principal | ICD-10-CM

## 2019-09-02 LAB — CBC
HCT: 31.7 % — ABNORMAL LOW (ref 36.0–46.0)
Hemoglobin: 10.3 g/dL — ABNORMAL LOW (ref 12.0–15.0)
MCH: 29.9 pg (ref 26.0–34.0)
MCHC: 32.5 g/dL (ref 30.0–36.0)
MCV: 92.2 fL (ref 80.0–100.0)
Platelets: 315 10*3/uL (ref 150–400)
RBC: 3.44 MIL/uL — ABNORMAL LOW (ref 3.87–5.11)
RDW: 13 % (ref 11.5–15.5)
WBC: 9.6 10*3/uL (ref 4.0–10.5)
nRBC: 0 % (ref 0.0–0.2)

## 2019-09-02 LAB — BASIC METABOLIC PANEL
Anion gap: 9 (ref 5–15)
BUN: 5 mg/dL — ABNORMAL LOW (ref 6–20)
CO2: 23 mmol/L (ref 22–32)
Calcium: 9 mg/dL (ref 8.9–10.3)
Chloride: 108 mmol/L (ref 98–111)
Creatinine, Ser: 0.76 mg/dL (ref 0.44–1.00)
GFR calc Af Amer: >60 mL/min (ref >60)
GFR calc non Af Amer: >60 mL/min (ref >60)
Glucose, Bld: 119 mg/dL — ABNORMAL HIGH (ref 70–99)
Potassium: 3 mmol/L — ABNORMAL LOW (ref 3.5–5.1)
Sodium: 140 mmol/L (ref 135–145)

## 2019-09-02 MED ORDER — POTASSIUM CHLORIDE CRYS ER 20 MEQ PO TBCR
20.0000 meq | EXTENDED_RELEASE_TABLET | Freq: Three times a day (TID) | ORAL | Status: DC
  Administered 2019-09-02 – 2019-09-03 (×4): 20 meq via ORAL
  Filled 2019-09-02 (×5): qty 1

## 2019-09-02 MED ORDER — HYDRALAZINE HCL 20 MG/ML IJ SOLN: 5.0000 mg | INTRAMUSCULAR | Status: DC | PRN

## 2019-09-02 NOTE — Progress Notes (Signed)
Occupational Therapy Note (late entry)  Pt able to follow commands in context of situation.   She planned to perform bathing at sink, but session limited by need to have BM, as well as elevated BP, c/o headache and indication of dizziness.  BP 251/214 while attempting to have BM (RN notified, pain meds provided), at end of session 125/78 .  Pt required min guard assist for toileting, however, she had a significant LOB on second sit to stand from toilet and required mod A to recover.  She then returned to bed due to feeling poorly.  Will continue to follow.  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     09/01/19 1100  OT Visit Information  Last OT Received On 09/01/19  Assistance Needed +1  History of Present Illness Pt is a 38 year old woman with unheralded onset of headache and inability to use her right side. CT revealed large left temporal ICH with intraventricular extension. Pt was intubated for airway protection. PT s/p left temporal craniotomy for hematoma evacuation and right frontal ventricular catheter placement. No pertinent history.  Precautions  Precautions Fall  Pain Assessment  Pain Assessment Faces  Faces Pain Scale 6  Pain Location head  Pain Descriptors / Indicators Headache;Grimacing;Guarding;Moaning  Pain Intervention(s) Monitored during session;Repositioned;Limited activity within patient's tolerance;RN gave pain meds during session  Cognition  Arousal/Alertness Awake/alert  Behavior During Therapy [facility] for tasks assessed/performed  Overall Cognitive Status Difficult to assess  General Comments unable to accurately assess cognition due to language deficits and session limited by toileting needs and HA   Upper Extremity Assessment  Upper Extremity Assessment Overall WFL for tasks assessed (grossly assessed during  functional activity )  Lower Extremity Assessment  Lower Extremity Assessment Overall WFL for tasks assessed  ADL  Overall ADL's  Needs assistance/impaired  Eating/Feeding Supervision/ safety;Set up  Toilet Transfer Moderate assistance;Ambulation;Comfort height toilet;Grab bars  Toilet Transfer Details (indicate cue type and reason) pt in BR upon OT entrance.  Pt ambulated from toilet to sink x 1 with min guard assist, but had to return to toilet due to urge to have BM.   Pt with c/o HA and indicated dizziness while sitting on Commode and with inital stand.  Upon standing the second time, but demonstrated significant LOB requiring mod A to recover.  BP initially elevated, then returned normal ranges (RN Notified and provided pain meds).   Functional mobility during ADLs Min guard;Moderate assistance  General ADL Comments session limited by need to have BM, as well as dizziness and HA.  Pt seemed to follow complex commands in context of situation (plan to bathe at sink).   Bed Mobility  General bed mobility comments Pt in BR with nsg  Balance  Overall balance assessment Needs assistance  Sitting-balance support No upper extremity supported  Sitting balance-Leahy Scale Good  Standing balance support No upper extremity supported  Standing balance-Leahy Scale Poor  Standing balance comment Pt with one significant LOB requiring mod A to recover  Vision- Assessment  Additional Comments not assessed this session   Transfers  Overall transfer level Needs assistance  Equipment used None  Transfers Sit to/from [employer] Transfers  Sit to [employer] guard;Mod assist  Stand pivot transfers Min guard  General transfer comment on second stand from toilet, but had significant LOB requiring mod A to recover.  General Comments  General comments (skin integrity, edema, etc.) while sitting on toilet attempting to have BM BP 251/214 - RN notified.   repeat BP 125/78.  Pt with c/o significant HA and  indicates dizziness.  she returned to bed at end of session due to feeling poorly   OT - End of Session  Activity Tolerance Patient limited by pain  Patient left in bed;with call bell/phone within reach;with bed alarm set  Nurse Communication Mobility status  OT Assessment/Plan  OT Plan Discharge plan remains appropriate  OT Visit Diagnosis Muscle weakness (generalized) (M62.81);Unsteadiness on feet (R26.81);Other symptoms and signs involving cognitive function  OT Frequency (ACUTE ONLY) Min 3X/week  Follow Up Recommendations CIR;Supervision/Assistance - 24 hour  OT Equipment Other (comment) (TBD)  AM-PAC OT "6 Clicks" Daily Activity Outcome Measure (Version 2)  Help from another person eating meals? 3  Help from another person taking care of personal grooming? 3  Help from another person toileting, which includes using toliet, bedpan, or urinal? 3  Help from another person bathing (including washing, rinsing, drying)? 3  Help from another person to put on and taking off regular upper body clothing? 3  Help from another person to put on and taking off regular lower body clothing? 3  6 Click Score 18  OT Goal Progression  Progress towards OT goals Progressing toward goals     09/01/19 1200  OT Time Calculation  OT Start Time (ACUTE ONLY) 1019  OT Stop Time (ACUTE ONLY) 1054  OT Time Calculation (min) 35 min  OT General Charges  $OT Visit 1 Visit  OT Treatments  $Self Care/Home Management  23-37 mins       Jeani Hawking, OTR/L Acute Rehabilitation Services Pager [phone] Office [phone]

## 2019-09-02 NOTE — Progress Notes (Signed)
STROKE TEAM PROGRESS NOTE   INTERVAL HISTORY  RN at bedside. Pt sitting in bed, just came back from bathroom. Moving all extremities well except RUE proximal 4+/5. Still has partial global aphasia, but improving.   OBJECTIVE Vitals:   09/02/19 0400 09/02/19 0500 09/02/19 0600 09/02/19 0700  BP: 117/81 110/64 121/65 114/64  Pulse: (!) 51 85 (!) 53 64  Resp: 17 17 (!) 8   Temp: 97.8 F (36.6 C)     TempSrc: Oral     SpO2: 98% 100% 99% 99%  Weight:      Height:        CBC:  Recent Labs  Lab 09/01/19 0640 09/02/19 0600  WBC 9.6 9.6  HGB 10.7* 10.3*  HCT 33.3* 31.7*  MCV 92.5 92.2  PLT 281 315    Basic Metabolic Panel:  Recent Labs  Lab 09/01/19 0640 09/02/19 0600  NA 141 140  K 3.6 3.0*  CL 107 108  CO2 24 23  GLUCOSE 112* 119*  BUN 5* 5*  CREATININE 0.63 0.76  CALCIUM 9.2 9.0    Lipid Panel:     Component Value Date/Time   CHOL 211 (H) 08/25/2019 1139   TRIG 153 (H) 08/25/2019 1139   TRIG 153 (H) 08/25/2019 1139   HDL 56 08/25/2019 1139   CHOLHDL 3.8 08/25/2019 1139   VLDL 31 08/25/2019 1139   LDLCALC 124 (H) 08/25/2019 1139   HgbA1c:  Lab Results  Component Value Date   HGBA1C 4.7 (L) 08/25/2019   Urine Drug Screen:     Component Value Date/Time   LABOPIA NONE DETECTED 08/25/2019 0613   COCAINSCRNUR NONE DETECTED 08/25/2019 0613   LABBENZ NONE DETECTED 08/25/2019 0613   AMPHETMU NONE DETECTED 08/25/2019 0613   THCU POSITIVE (A) 08/25/2019 0613   LABBARB NONE DETECTED 08/25/2019 0613    Alcohol Level     Component Value Date/Time   ETH <10 08/25/2019 0608    IMAGING   Ct Head Wo Contrast 08/30/2019 1. Unchanged left temporal and left lateral intraventricular hemorrhage. No hydrocephalus. 2. Linear high-density along the left frontal convexity could be postoperative blood products but is notably tubular and could also be a thrombosed vein of Trolard. Consider venous imaging.   CT Head Wo Contrast 08/26/2019 Stable since yesterday. No new  or worsening finding. Status post evacuation of a left temporoparietal intraparenchymal hematoma.  No evidence of recurrent bleeding. Residual stable hemorrhage as described above.  Mild mass effect with left-to-right shift of 4 mm.  Ct Head Wo Contrast 08/25/2019 1. Postoperative changes from interval left craniotomy and left temporoparietal parenchymal hematoma evacuation. Persistent hemorrhage in the left temporal region measures 2.6 x 1.4 x 2.7 cm, and may be within a dilated left temporal horn or parenchymal and effacing the left temporal.  2. Persistent large volume hemorrhage within the left lateral ventricle. Small amount of hemorrhage also present within the third and now fourth ventricles.  3. Interval placement of a right frontal approach ventricular catheter terminating near the right foramen of Monro. The right lateral, third and fourth ventricles do not appear dilated.  4. Small volume subarachnoid hemorrhage overlying the left cerebral hemisphere.  5. Decreased mass effect with interval decrease in rightward midline shift, now 5 mm.   Ct Angio Head W Or Wo Contrast Ct Angio Neck W Or Wo Contrast 08/25/2019 1. Normal variant CTA Circle of Willis without significant proximal stenosis, aneurysm, or branch vessel occlusion. No acute or focal lesion to explain the left temporal lobe  hemorrhage.  2. Normal CTA of the neck.  3. Endotracheal tube terminates 2 cm above the carina and could be pulled back 1-2 cm for more optimal positioning.   Ct Head Wo Contrast 08/25/2019 1. Large left temporal and parietal hematoma measures 7.8 x 3.3 x 3.0 cm.  2. Intraventricular extension of hemorrhage.  3. Diffuse left hemispheric mass effect with midline shift measuring 8 mm.   Diagnostic cerebral catheter angiogram: 08/31/2019 :suggestive of thrombosis of the left vein of Trolard   The dural venous sinuses remain widely patent. There are no intracranial aneurysms, arteriovenous malformations, or  high-flow fistulas identified  Dg Chest Port 1 View 08/25/2019 ETT in good position. OG tube courses into the stomach and below the film. No acute disease.  08/25/2019 No acute cardiopulmonary findings.   2D Echocardiogram 1. Left ventricular ejection fraction, by visual estimation, is 60 to 65%. The left ventricle has normal function. There is no left ventricular hypertrophy.  2. The left ventricle has no regional wall motion abnormalities.  3. Global right ventricle has normal systolic function.The right ventricular size is normal. No increase in right ventricular wall thickness.  4. Left atrial size was normal.  5. Right atrial size was normal.  6. The mitral valve is normal in structure. Trace mitral valve regurgitation. No evidence of mitral stenosis.  7. The tricuspid valve is normal in structure. Tricuspid valve regurgitation is not demonstrated.  8. The aortic valve is normal in structure. Aortic valve regurgitation is not visualized. No evidence of aortic valve sclerosis or stenosis.  9. The pulmonic valve was normal in structure. Pulmonic valve regurgitation is not visualized. 10. The inferior vena cava is normal in size with <50% respiratory variability, suggesting right atrial pressure of 8 mmHg.  ECG - SR rate 60 BPM. (See cardiology reading for complete details)   PHYSICAL EXAM      Temp:  [97.8 F (36.6 C)-98.8 F (37.1 C)] 98.8 F (37.1 C) (12/12 0800) Pulse Rate:  [45-97] 62 (12/12 0900) Resp:  [8-25] 8 (12/12 0600) BP: (103-160)/(51-87) 112/70 (12/12 0900) SpO2:  [96 %-100 %] 100 % (12/12 0900)  General - Well nourished, well developed, in no apparent distress.  Ophthalmologic - fundi not visualized due to noncooperation.  Cardiovascular - Regular rhythm and rate.  Neuro - awake alert, but partial global aphasia, able to say "yes" "I do not know", anomia, able to repeat simple sentences but not complex sentences. Following most midline commands, but not stick  out tongue as requested, not following most peripheral commands but able to show "two fingers" and make "fist". Moving all extremities well except RUE proximal deltoid 4+/5. Sensation symmetrical, FTN not quite cooperative. Gait not tested.    ASSESSMENT/PLAN [name] is a 38 y.o. female with history of recent pregnancy with delivery in August presenting with HA, AMS, right sided weakness, and left gaze preference. She did not receive IV t-PA due to hemorrhage. Left temporal craniotomy and placement of EVD catheter 08/25/19. Will need cerebral angiogram at some point - Dr Conchita Paris.  Stroke: Large left temporal and parietal ICH w/ IVH s/p crani w/ evacuation and EVD placement- unknown etiology ?  Vein of Trolard thrombosis - unusual for it to occur nearly 3 months after pregnancy  CT head - 08/25/19 - Large left temporal and parietal hematoma. IVH. Diffuse left hemispheric mass effect with midline shift measuring 8 mm.   Repeat CTH - 08/25/19 - Postoperative L craniotomy and L ICH evacuation. Persistent hemorrhage  L temporal region. Persistent large L lateral ventricle. IVH in third and now fourth ventricles. Ventricles not dilated. Small L cerebral SAH . Decreased mass effect with R midline shift, now 5 mm.  CTA head and neck Unremarkable, however, abnormal left parietal vascular signals   CTH - 08/26/19 - Stable. Mild mass effect with left-to-right shift of 4 mm.  Repeat CT 12/9 unchanged L temporal lobe and L lateral IVH. No hydro. Linear density L frontal convexity.   2D Echo - EF 60-65%. No source of embolus    Amanda Small Virus 2 - negative  LDL - 124  HgbA1c - 4.7  UDS - THCU  VTE prophylaxis - SCDs  No antithrombotic prior to admission, now on No antithrombotic  Therapy recommendations:  CIR  Disposition:  Pending  S/p left temporal craniotomy and EVD   On Keppra  S/p EVD 12/4  EVD draining, pt tolerating well  EVD removed 12/10  Repeat CT 12/9 unchanged  L temporal lobe and L lateral IVH. No hydro.   NSG following  Left vein of Trolard thrombosis  Initial CTA head and neck 12/4 - left parietal cortical abnormal vascular signal  CT head 12/9 Linear density L frontal convexity ? Thrombosed vein.   Cerebral angiogram 08/31/19 - Dr. Kathyrn Sheriff - suggestive of thrombosis of the left vein of Trolard as described above. The dural venous sinuses remain widely patent. There are no intracranial aneurysms, arteriovenous malformations, or high-flow fistulas identified.  Concerning for CVT for the ICH/IVH, however, atypical for postpartum hypercoagulability after 3 months of delivery and without other evidence of venous thrombosis  LE venous doppler neg  Hypercoagulable negative except protein S activity slight lower 60  Agree with Dr. Leonie Man - "Given significant amount of intraventricular and parenchymal blood it would be risky to start anticoagulation at this time and will continue aspirin for now but she may need anticoagulation in the near future only if she has a hypercoagulable disorder-showed isolated vein of Trolard thrombosis in 3 months postpartum situation with significant amount of intraventricular and parenchymal hemorrhage is indication for anticoagulation"  Blood Pressure   Home BP meds: none, no hx HTN  Current BP meds: hydralazine prn SBP > 160   SBP goal < 160 mm Hg    BP stable on the lower end . Long-term BP goal normotensive . Encourage po intake . D/c IVF  Hyperlipidemia  Home Lipid lowering medication: none  LDL 124, goal < 70   Current lipid lowering medication: none  Consider statin at discharge  Dysphagia, resolved . Secondary to stroke . on IVF at 60h as breast feeding . Speech on board . Now on Vegetarian diet w/ thin liquids   Other Stroke Risk Factors  Obesity, Body mass index is 29.03 kg/m., recommend weight loss, diet and exercise as appropriate   THC use  Other Active Problems  Recent  pregnancy with delivery in August, breast feeding/pumping  Leukocytosis, WBC 12.9->9.4->8.7->9.6, off ancef  Hypokalemia - 3.1->3.4->3.5->3.0 - supplement and recheck  Bradycardia - 30's - 08/26/19 - 9:37 pm - atropine per eLink - currently not on meds that should cause bradycardia except prn Labetalol - changed to hydralazine - monitor - remains in the 50s  Postpartum anemia - Hb - 10.1->10.3  This patient is critically ill and at significant risk of neurological worsening, death and care requires constant monitoring of vital signs, hemodynamics,respiratory and cardiac monitoring, extensive review of multiple databases, frequent neurological assessment, discussion with family, other specialists and medical decision  making of high complexity. I spent 30 minutes of neurocritical care time  in the care of  this patient.  Marvel Plan, MD PhD Stroke Neurology 09/02/2019 10:13 AM   To contact Stroke Continuity provider, please refer to [email or web address]. After hours, contact General Neurology

## 2019-09-03 LAB — BASIC METABOLIC PANEL
Anion gap: 13 (ref 5–15)
BUN: <5 mg/dL — ABNORMAL LOW (ref 6–20)
CO2: 21 mmol/L — ABNORMAL LOW (ref 22–32)
Calcium: 9.3 mg/dL (ref 8.9–10.3)
Chloride: 107 mmol/L (ref 98–111)
Creatinine, Ser: 0.56 mg/dL (ref 0.44–1.00)
GFR calc Af Amer: >60 mL/min (ref >60)
GFR calc non Af Amer: >60 mL/min (ref >60)
Glucose, Bld: 120 mg/dL — ABNORMAL HIGH (ref 70–99)
Potassium: 3.2 mmol/L — ABNORMAL LOW (ref 3.5–5.1)
Sodium: 141 mmol/L (ref 135–145)

## 2019-09-03 LAB — CBC
HCT: 34.5 % — ABNORMAL LOW (ref 36.0–46.0)
Hemoglobin: 11.1 g/dL — ABNORMAL LOW (ref 12.0–15.0)
MCH: 29.9 pg (ref 26.0–34.0)
MCHC: 32.2 g/dL (ref 30.0–36.0)
MCV: 93 fL (ref 80.0–100.0)
Platelets: 374 10*3/uL (ref 150–400)
RBC: 3.71 MIL/uL — ABNORMAL LOW (ref 3.87–5.11)
RDW: 13.2 % (ref 11.5–15.5)
WBC: 8.9 10*3/uL (ref 4.0–10.5)
nRBC: 0 % (ref 0.0–0.2)

## 2019-09-03 LAB — MPO/PR-3 (ANCA) ANTIBODIES
ANCA Proteinase 3: <3.5 U/mL (ref 0.0–3.5)
Myeloperoxidase Abs: <9 U/mL (ref 0.0–9.0)

## 2019-09-03 MED ORDER — POTASSIUM CHLORIDE CRYS ER 20 MEQ PO TBCR
40.0000 meq | EXTENDED_RELEASE_TABLET | ORAL | Status: AC
  Administered 2019-09-03: 12:00:00 20 meq via ORAL
  Administered 2019-09-03: 40 meq via ORAL
  Filled 2019-09-03: qty 2

## 2019-09-03 NOTE — Progress Notes (Signed)
STROKE TEAM PROGRESS NOTE   INTERVAL HISTORY  No family at bedside. Pt lying in bed, sleeping but easily arousable. Still has partial global aphasia. K still low, will give more supplement. BP stable.    OBJECTIVE Vitals:   09/02/19 2353 09/03/19 0000 09/03/19 0359 09/03/19 0400  BP:  101/68  (!) 111/52  Pulse:      Resp:  20  19  Temp: 98.6 F (37 C)  98.5 F (36.9 C)   TempSrc: Oral     SpO2:      Weight:      Height:        CBC:  Recent Labs  Lab 09/02/19 0600 09/03/19 0423  WBC 9.6 8.9  HGB 10.3* 11.1*  HCT 31.7* 34.5*  MCV 92.2 93.0  PLT 315 374    Basic Metabolic Panel:  Recent Labs  Lab 09/02/19 0600 09/03/19 0423  NA 140 141  K 3.0* 3.2*  CL 108 107  CO2 23 21*  GLUCOSE 119* 120*  BUN 5* <5*  CREATININE 0.76 0.56  CALCIUM 9.0 9.3    Lipid Panel:     Component Value Date/Time   CHOL 211 (H) 08/25/2019 1139   TRIG 153 (H) 08/25/2019 1139   TRIG 153 (H) 08/25/2019 1139   HDL 56 08/25/2019 1139   CHOLHDL 3.8 08/25/2019 1139   VLDL 31 08/25/2019 1139   LDLCALC 124 (H) 08/25/2019 1139   HgbA1c:  Lab Results  Component Value Date   HGBA1C 4.7 (L) 08/25/2019   Urine Drug Screen:     Component Value Date/Time   LABOPIA NONE DETECTED 08/25/2019 0613   COCAINSCRNUR NONE DETECTED 08/25/2019 0613   LABBENZ NONE DETECTED 08/25/2019 0613   AMPHETMU NONE DETECTED 08/25/2019 0613   THCU POSITIVE (A) 08/25/2019 0613   LABBARB NONE DETECTED 08/25/2019 0613    Alcohol Level     Component Value Date/Time   ETH <10 08/25/2019 0608    IMAGING   Ct Head Wo Contrast 08/30/2019 1. Unchanged left temporal and left lateral intraventricular hemorrhage. No hydrocephalus. 2. Linear high-density along the left frontal convexity could be postoperative blood products but is notably tubular and could also be a thrombosed vein of Trolard. Consider venous imaging.   CT Head Wo Contrast 08/26/2019 Stable since yesterday. No new or worsening finding. Status post  evacuation of a left temporoparietal intraparenchymal hematoma.  No evidence of recurrent bleeding. Residual stable hemorrhage as described above.  Mild mass effect with left-to-right shift of 4 mm.  Ct Head Wo Contrast 08/25/2019 1. Postoperative changes from interval left craniotomy and left temporoparietal parenchymal hematoma evacuation. Persistent hemorrhage in the left temporal region measures 2.6 x 1.4 x 2.7 cm, and may be within a dilated left temporal horn or parenchymal and effacing the left temporal.  2. Persistent large volume hemorrhage within the left lateral ventricle. Small amount of hemorrhage also present within the third and now fourth ventricles.  3. Interval placement of a right frontal approach ventricular catheter terminating near the right foramen of Monro. The right lateral, third and fourth ventricles do not appear dilated.  4. Small volume subarachnoid hemorrhage overlying the left cerebral hemisphere.  5. Decreased mass effect with interval decrease in rightward midline shift, now 5 mm.   Ct Angio Head W Or Wo Contrast Ct Angio Neck W Or Wo Contrast 08/25/2019 1. Normal variant CTA Circle of Willis without significant proximal stenosis, aneurysm, or branch vessel occlusion. No acute or focal lesion to explain the left temporal  lobe hemorrhage.  2. Normal CTA of the neck.  3. Endotracheal tube terminates 2 cm above the carina and could be pulled back 1-2 cm for more optimal positioning.   Ct Head Wo Contrast 08/25/2019 1. Large left temporal and parietal hematoma measures 7.8 x 3.3 x 3.0 cm.  2. Intraventricular extension of hemorrhage.  3. Diffuse left hemispheric mass effect with midline shift measuring 8 mm.   Diagnostic cerebral catheter angiogram: 08/31/2019 :suggestive of thrombosis of the left vein of Trolard   The dural venous sinuses remain widely patent. There are no intracranial aneurysms, arteriovenous malformations, or high-flow fistulas  identified  Dg Chest Port 1 View 08/25/2019 ETT in good position. OG tube courses into the stomach and below the film. No acute disease.  08/25/2019 No acute cardiopulmonary findings.   2D Echocardiogram 1. Left ventricular ejection fraction, by visual estimation, is 60 to 65%. The left ventricle has normal function. There is no left ventricular hypertrophy.  2. The left ventricle has no regional wall motion abnormalities.  3. Global right ventricle has normal systolic function.The right ventricular size is normal. No increase in right ventricular wall thickness.  4. Left atrial size was normal.  5. Right atrial size was normal.  6. The mitral valve is normal in structure. Trace mitral valve regurgitation. No evidence of mitral stenosis.  7. The tricuspid valve is normal in structure. Tricuspid valve regurgitation is not demonstrated.  8. The aortic valve is normal in structure. Aortic valve regurgitation is not visualized. No evidence of aortic valve sclerosis or stenosis.  9. The pulmonic valve was normal in structure. Pulmonic valve regurgitation is not visualized. 10. The inferior vena cava is normal in size with <50% respiratory variability, suggesting right atrial pressure of 8 mmHg.  ECG - SR rate 60 BPM. (See cardiology reading for complete details)   PHYSICAL EXAM      Temp:  [98.1 F (36.7 C)-99.1 F (37.3 C)] 98.5 F (36.9 C) (12/13 0359) Pulse Rate:  [55-95] 95 (12/12 1200) Resp:  [13-20] 19 (12/13 0400) BP: (91-128)/(52-75) 111/52 (12/13 0400) SpO2:  [99 %-100 %] 99 % (12/12 1200)  General - Well nourished, well developed, in no apparent distress.  Ophthalmologic - fundi not visualized due to noncooperation.  Cardiovascular - Regular rhythm and rate.  Neuro - awake alert, but partial global aphasia, able to say "yes" "I do not know", anomia, able to repeat simple sentences but not complex sentences. Following most midline commands, but not stick out tongue as  requested, not following most peripheral commands but able to show "two fingers" and make "fist". Moving all extremities well except RUE proximal deltoid 4+/5. Sensation symmetrical, FTN not quite cooperative. Gait not tested.    ASSESSMENT/PLAN [name] is a 38 y.o. female with history of recent pregnancy with delivery in August presenting with HA, AMS, right sided weakness, and left gaze preference. She did not receive IV t-PA due to hemorrhage. Left temporal craniotomy and placement of EVD catheter 08/25/19. Will need cerebral angiogram at some point - Dr Conchita ParisNundkumar.  Stroke: Large left temporal and parietal ICH w/ IVH s/p crani w/ evacuation and EVD placement- unknown etiology ?  Vein of Trolard thrombosis - unusual for it to occur nearly 3 months after pregnancy  CT head - 08/25/19 - Large left temporal and parietal hematoma. IVH. Diffuse left hemispheric mass effect with midline shift measuring 8 mm.   Repeat CTH - 08/25/19 - Postoperative L craniotomy and L ICH evacuation. Persistent  hemorrhage L temporal region. Persistent large L lateral ventricle. IVH in third and now fourth ventricles. Ventricles not dilated. Small L cerebral SAH . Decreased mass effect with R midline shift, now 5 mm.  CTA head and neck Unremarkable, however, abnormal left parietal vascular signals   CTH - 08/26/19 - Stable. Mild mass effect with left-to-right shift of 4 mm.  Repeat CT 12/9 unchanged L temporal lobe and L lateral IVH. No hydro. Linear density L frontal convexity.   2D Echo - EF 60-65%. No source of embolus    [employer] Virus 2 - negative  LDL - 124  HgbA1c - 4.7  UDS - THCU  VTE prophylaxis - SCDs  No antithrombotic prior to admission, now on No antithrombotic  Therapy recommendations:  CIR  Disposition:  Pending  S/p left temporal craniotomy and EVD   On Keppra  S/p EVD 12/4  EVD draining, pt tolerating well  EVD removed 12/10  Repeat CT 12/9 unchanged L temporal  lobe and L lateral IVH. No hydro.   NSG following  Left vein of Trolard thrombosis  Initial CTA head and neck 12/4 - left parietal cortical abnormal vascular signal  CT head 12/9 Linear density L frontal convexity ? Thrombosed vein.   Cerebral angiogram 08/31/19 - Dr. Kathyrn Sheriff - suggestive of thrombosis of the left vein of Trolard as described above. The dural venous sinuses remain widely patent. There are no intracranial aneurysms, arteriovenous malformations, or high-flow fistulas identified.  Concerning for CVT for the ICH/IVH, however, atypical for postpartum hypercoagulability after 3 months of delivery and without other evidence of venous thrombosis  LE venous doppler neg  Hypercoagulable negative except protein S activity slight lower 60  Agree with Dr. Leonie Man - "Given significant amount of intraventricular and parenchymal blood it would be risky to start anticoagulation at this time and will continue aspirin for now but she may need anticoagulation in the near future only if she has a hypercoagulable disorder-showed isolated vein of Trolard thrombosis in 3 months postpartum situation with significant amount of intraventricular and parenchymal hemorrhage is indication for anticoagulation"  Blood Pressure   Home BP meds: none, no hx HTN  Current BP meds: hydralazine prn SBP > 160   SBP goal < 160 mm Hg    BP stable on the lower end . Long-term BP goal normotensive . Encourage po intake . IVF d/c'ed  Hyperlipidemia  Home Lipid lowering medication: none  LDL 124, goal < 70   Current lipid lowering medication: none  Consider statin at discharge  Dysphagia, resolved . Secondary to stroke . On IVF at 60h as breast feeding . Speech on board . Now on Vegetarian diet w/ thin liquids   Other Stroke Risk Factors  Obesity, Body mass index is 29.03 kg/m., recommend weight loss, diet and exercise as appropriate   THC use  Other Active Problems  Recent pregnancy with  delivery in August, breast feeding/pumping  Leukocytosis, WBC 12.9->9.4->8.7->9.6->8.9 - off ancef  Hypokalemia - 3.1->3.4->3.5->3.0->3.2 - supplement again and recheck in am  Bradycardia - 30's - 08/26/19 - 9:37 pm - atropine per eLink - currently not on meds that should cause bradycardia except prn Labetalol - changed to hydralazine - monitor - remains in the 50s - 90s  Postpartum anemia, improving - Hb - 10.1->10.3->11.1  Hospital day #9  Rosalin Hawking, MD PhD Stroke Neurology 09/03/2019 10:46 AM  To contact Stroke Continuity provider, please refer to [email or web address] After hours, contact General Neurology

## 2019-09-03 NOTE — Progress Notes (Signed)
Neurosurgery Service Progress Note  Subjective: No acute events overnight   Objective: Vitals:   09/03/19 0000 09/03/19 0359 09/03/19 0400 09/03/19 0800  BP: 101/68  (!) 111/52 114/66  Pulse:      Resp: 20  19   Temp:  98.5 F (36.9 C)  98.2 F (36.8 C)  TempSrc:    Oral  SpO2:      Weight:      Height:       Temp (24hrs), Avg:98.5 F (36.9 C), Min:98.1 F (36.7 C), Max:99.1 F (37.3 C)  CBC Latest Ref Rng & Units 09/03/2019 09/02/2019 09/01/2019  WBC 4.0 - 10.5 K/uL 8.9 9.6 9.6  Hemoglobin 12.0 - 15.0 g/dL 11.1(L) 10.3(L) 10.7(L)  Hematocrit 36.0 - 46.0 % 34.5(L) 31.7(L) 33.3(L)  Platelets 150 - 400 K/uL 374 315 281   BMP Latest Ref Rng & Units 09/03/2019 09/02/2019 09/01/2019  Glucose 70 - 99 mg/dL 120(H) 119(H) 112(H)  BUN 6 - 20 mg/dL <5(L) 5(L) 5(L)  Creatinine 0.44 - 1.00 mg/dL 0.56 0.76 0.63  Sodium 135 - 145 mmol/L 141 140 141  Potassium 3.5 - 5.1 mmol/L 3.2(L) 3.0(L) 3.6  Chloride 98 - 111 mmol/L 107 108 107  CO2 22 - 32 mmol/L 21(L) 23 24  Calcium 8.9 - 10.3 mg/dL 9.3 9.0 9.2    Intake/Output Summary (Last 24 hours) at 09/03/2019 2542 Last data filed at 09/03/2019 0800 Gross per 24 hour  Intake 229.52 ml  Output --  Net 229.52 ml    Current Facility-Administered Medications:  .   stroke: mapping our early stages of recovery book, , Does not apply, Once, [name], MD .  0.9 %  sodium chloride infusion, , Intravenous, PRN, Amie Portland, MD, Stopped at 08/29/19 1157 .  acetaminophen (TYLENOL) tablet 650 mg, 650 mg, Oral, Q4H PRN, 650 mg at 09/02/19 1801 **OR** acetaminophen (TYLENOL) 160 MG/5ML solution 650 mg, 650 mg, Per Tube, Q4H PRN **OR** acetaminophen (TYLENOL) suppository 650 mg, 650 mg, Rectal, Q4H PRN, [name], MD .  aspirin EC tablet 81 mg, 81 mg, Oral, Daily, Garvin Fila, MD, 81 mg at 09/02/19 0929 .  Chlorhexidine Gluconate Cloth 2 % PADS 6 each, 6 each, Topical, Daily, Aroor, Lanice Schwab, MD, 6 each at 09/02/19 0930 .   hydrALAZINE (APRESOLINE) injection 5-10 mg, 5-10 mg, Intravenous, Q4H PRN, Rosalin Hawking, MD .  HYDROcodone-acetaminophen (NORCO/VICODIN) 5-325 MG per tablet 1 tablet, 1 tablet, Oral, Q4H PRN, [name], MD, 1 tablet at 09/03/19 0316 .  levETIRAcetam (KEPPRA) tablet 500 mg, 500 mg, Oral, BID, [name], MD, 500 mg at 09/02/19 2119 .  naloxone [facility]) injection 0.08 mg, 0.08 mg, Intravenous, PRN, [name], MD .  ondansetron (ZOFRAN) tablet 4 mg, 4 mg, Oral, Q4H PRN **OR** ondansetron (ZOFRAN) injection 4 mg, 4 mg, Intravenous, Q4H PRN, [name], MD, 4 mg at 08/31/19 1544 .  potassium chloride SA (KLOR-CON) CR tablet 20 mEq, 20 mEq, Oral, TID, Rinehuls, David L, PA-C, 20 mEq at 09/02/19 2119 .  promethazine (PHENERGAN) tablet 12.5-25 mg, 12.5-25 mg, Oral, Q4H PRN, [name], MD .  sodium chloride flush (NS) 0.9 % injection 10-40 mL, 10-40 mL, Intracatheter, Q12H, Garvin Fila, MD, 10 mL at 09/02/19 2121 .  sodium chloride flush (NS) 0.9 % injection 10-40 mL, 10-40 mL, Intracatheter, PRN, Garvin Fila, MD, 10 mL at 09/02/19 0930   Physical Exam: Awake, mildly somnolent, mumbles to questions but otherwise mute/aphasic, MAEx4 with mild left sided preference, incisions c/d/i w/ staples  Assessment & Plan: 38 y.o. woman w/ ICH s/p evac, angio w/ likely trolard occlusion in the setting of very late post-partum.  -No changes in neurosurgical plan of care  Amanda Small  09/03/19 9:24 AM

## 2019-09-03 NOTE — Progress Notes (Signed)
Patient's sister is requesting Omelia  be re-evaluated for CIR in regards to cognitive and verbal therapies. Please reach out to sister regarding the matter.

## 2019-09-04 ENCOUNTER — Encounter (HOSPITAL_COMMUNITY): Payer: Self-pay | Admitting: Student in an Organized Health Care Education/Training Program

## 2019-09-04 ENCOUNTER — Other Ambulatory Visit: Payer: Self-pay

## 2019-09-04 LAB — ANCA TITERS
Atypical P-ANCA titer: <1:20 {titer}
C-ANCA: <1:20 {titer}
P-ANCA: <1:20 {titer}

## 2019-09-04 LAB — CBC
HCT: 36.5 % (ref 36.0–46.0)
Hemoglobin: 11.7 g/dL — ABNORMAL LOW (ref 12.0–15.0)
MCH: 29.8 pg (ref 26.0–34.0)
MCHC: 32.1 g/dL (ref 30.0–36.0)
MCV: 93.1 fL (ref 80.0–100.0)
Platelets: 415 10*3/uL — ABNORMAL HIGH (ref 150–400)
RBC: 3.92 MIL/uL (ref 3.87–5.11)
RDW: 13.2 % (ref 11.5–15.5)
WBC: 8.2 10*3/uL (ref 4.0–10.5)
nRBC: 0 % (ref 0.0–0.2)

## 2019-09-04 LAB — BASIC METABOLIC PANEL
Anion gap: 10 (ref 5–15)
BUN: 5 mg/dL — ABNORMAL LOW (ref 6–20)
CO2: 24 mmol/L (ref 22–32)
Calcium: 9.6 mg/dL (ref 8.9–10.3)
Chloride: 106 mmol/L (ref 98–111)
Creatinine, Ser: 0.64 mg/dL (ref 0.44–1.00)
GFR calc Af Amer: >60 mL/min (ref >60)
GFR calc non Af Amer: >60 mL/min (ref >60)
Glucose, Bld: 108 mg/dL — ABNORMAL HIGH (ref 70–99)
Potassium: 3.9 mmol/L (ref 3.5–5.1)
Sodium: 140 mmol/L (ref 135–145)

## 2019-09-04 LAB — PROTHROMBIN GENE MUTATION

## 2019-09-04 LAB — FACTOR 5 LEIDEN

## 2019-09-04 MED ORDER — ACETAMINOPHEN 160 MG/5ML PO SOLN: 650.0000 mg | Freq: Four times a day (QID) | ORAL | Status: DC | PRN

## 2019-09-04 MED ORDER — ACETAMINOPHEN 325 MG PO TABS
650.0000 mg | ORAL_TABLET | Freq: Four times a day (QID) | ORAL | Status: DC | PRN
  Administered 2019-09-05: 650 mg via ORAL
  Filled 2019-09-04: qty 2

## 2019-09-04 MED ORDER — BUTALBITAL-APAP-CAFFEINE 50-325-40 MG PO TABS
1.0000 | ORAL_TABLET | Freq: Three times a day (TID) | ORAL | Status: DC | PRN
  Administered 2019-09-04 – 2019-09-05 (×2): 1 via ORAL
  Filled 2019-09-04 (×2): qty 1

## 2019-09-04 MED ORDER — ACETAMINOPHEN 650 MG RE SUPP: 650.0000 mg | Freq: Four times a day (QID) | RECTAL | Status: DC | PRN

## 2019-09-04 NOTE — Progress Notes (Addendum)
PT Cancellation Note  Patient Details Name: Amanda Small MRN: [id number] DOB: [birth year]   Cancelled Treatment:   11:20 am Reason Eval/Treat Not Completed: (P) Patient declined, no reason specified(Pt reports,"I don't feel like."  Provided education and encouragement and she continues to decline.)   Jaquise Faux J Stann Mainland 09/04/2019, 1:26 PM Ashlyne Olenick Alfonso Patten , PTA Acute Rehabilitation Services Pager [phone] Office [phone]  Returned at 1642 patient seated in recliner with head on foot rest.  At first she avoided eye contact and reports,"No, I am not going to do it."  When PTA further engaged in asking why she became agitated and started to raise her voice.  Will continue effort per POC but at this time I cannot make changes in recommendations until she participates.  The MD reports she is much improved but unable to assess.   Jarvis Morgan Acute Rehabilitation Services Pager [phone] Office [phone]

## 2019-09-04 NOTE — Progress Notes (Signed)
  Speech Language Pathology Treatment: Cognitive-Linquistic  Patient Details Name: Amanda Small MRN: [id number] DOB: 01-11-1981 Today's Date: 09/04/2019 Time: 1450-1505 SLP Time Calculation (min) (ACUTE ONLY): 15 min  Assessment / Plan / Recommendation Clinical Impression  Pt was encountered asleep in bed; however, she roused to minimal verbal cues and she was agreeable to ST treatment session.  Treatment focused on expressive and receptive language deficits.  She exhibited frequent verbal perseveration throughout this tx session and she perseverated on "Yes" and "I don't know." in particular.  She participated in a vowel repetition task and she was able to imitate the vowel /a/.  She was unable to imitate additional vowels and perseverated on /a/.  She was unable to repeat CV sounds with bilabial consonants and the vowel /a/; however, she was able to repeat the word "apple" x1 given a verbal model, visual cue, and extra time.  Pt additionally participated in automatic speech tasks including counting, days of the week, and letters.  She counted from 1-10 with 100% accuracy given a verbal and visual model.  She then attempted the days of the week and letters A-E; however, she perseverated on numbers 1-10.  Pt with occasional moaning throughout this tx session and she answered "yes" when asked if she had head pain.  Pt was asked to verbally state a number 1-10 to rank her pain and she exhibited difficulty with this; however, when the scale was drawn and shown to the pt, she was able to point to the number 6 to indicate her pain level.  Recommend additional ST 5x weekly (CIR) at time of discharge.  SLP will continue to f/u per POC.      HPI HPI: Amanda Small is a 38 y.o. female with no significant past medical history admitted with right sided weakness and confusion. CT was done that showed a large left temporal ICH with extension into the ventricles. Per chart, pt gave birth August 2020.  She is  s/p craniotomy with ventriculostomy.       SLP Plan  Continue with current plan of care       Recommendations                   Oral Care Recommendations: Oral care BID Follow up Recommendations: Inpatient Rehab SLP Visit Diagnosis: Aphasia (R47.01) Plan: Continue with current plan of care                     Colin Mulders M.S., [place] Office: [phone]  Gar[place] II 09/04/2019, 3:13 PM

## 2019-09-04 NOTE — Progress Notes (Signed)
Occupational Therapy Treatment Patient Details Name: Amanda Small MRN: [id number] DOB: 09-Jan-1981 Today's Date: 09/04/2019    History of present illness Pt is a 38 year old woman with unheralded onset of headache and inability to use her right side. CT revealed large left temporal ICH with intraventricular extension. Pt was intubated for airway protection. PT s/p left temporal craniotomy for hematoma evacuation and right frontal ventricular catheter placement. No pertinent history.   OT comments  Pt able to perform sponge bath, grooming, don gown, and socks with supervision at sit to stand level.  She was able to correctly sequence through activity and demonstrated good attention to task and was very thorough with her bath.  She was able to start and stop task appropriately and pick up where she left off.  She did apply deodorant 4 times which may indicate a memory deficit.  She was able to locate all needed items on the vanity with no cues.  Due to pt requiring no physical assistance, and no verbal cues for basic ADLs, CIR level therapies are no longer recommended.  She will benefit from consistent structure and routine at home to allow her maximal safety and independence, as well as 24 hour supervision, and assistance with IADLs including childcare.  Recommend HHOT.   Follow Up Recommendations  Home health OT;Supervision/Assistance - 24 hour    Equipment Recommendations  Tub/shower bench    Recommendations for Other Services      Precautions / Restrictions Precautions Precautions: Fall       Mobility Bed Mobility Overal bed mobility: Needs Assistance Bed Mobility: Supine to Sit;Sit to Supine     Supine to sit: Supervision Sit to supine: Supervision   General bed mobility comments: supervision for safety   Transfers Overall transfer level: Needs assistance Equipment used: None Transfers: Sit to/from Stand;Stand Pivot Transfers Sit to Stand: Supervision Stand pivot  transfers: Supervision            Balance Overall balance assessment: Needs assistance Sitting-balance support: No upper extremity supported Sitting balance-Leahy Scale: Good     Standing balance support: No upper extremity supported Standing balance-Leahy Scale: Fair Standing balance comment: able to maintain static standing with no LOB, and dynamic tasks with supervision and UE support                            ADL either performed or assessed with clinical judgement   ADL Overall ADL's : Needs assistance/impaired     Grooming: Wash/dry hands;Wash/dry face;Oral care;Supervision/safety;Sitting;Standing   Upper Body Bathing: Set up;Supervision/ safety;Sitting   Lower Body Bathing: Supervison/ safety;Sit to/from stand   Upper Body Dressing : Set up;Supervision/safety;Sitting Upper Body Dressing Details (indicate cue type and reason): donning gown  Lower Body Dressing: Supervision/safety;Sit to/from stand   Toilet Transfer: Ambulation;Comfort height toilet;Supervision/safety   Toileting- Clothing Manipulation and Hygiene: Supervision/safety       Functional mobility during ADLs: Supervision/safety       Vision   Additional Comments: Pt able to locate all needed items on sink vanity    Perception     Praxis      Cognition Arousal/Alertness: Awake/alert Behavior During Therapy: WFL for tasks assessed/performed Overall Cognitive Status: Difficult to assess                                 General Comments: Pt able to sequence through am ADL.  she was  noted to apply deodorant 4 times indicating likely memory impairment.  She, however, was able to stop start activity appropriately remembering where she had stopped.          Exercises     Shoulder Instructions       General Comments affect brighter at end of session     Pertinent Vitals/ Pain       Pain Assessment: 0-10 Pain Score: 6  Faces Pain Scale: Hurts little more Pain  Location: Head Pain Descriptors / Indicators: Headache;Grimacing;Moaning Pain Intervention(s): Limited activity within patient's tolerance;Monitored during session  Home Living                                          Prior Functioning/Environment              Frequency  Min 3X/week        Progress Toward Goals  OT Goals(current goals can now be found in the care plan section)  Progress towards OT goals: Progressing toward goals;Goals met and updated - see care plan  Acute Rehab OT Goals OT Goal Formulation: Patient unable to participate in goal setting Time For Goal Achievement: 09/18/19 Potential to Achieve Goals: Good ADL Goals Pt Will Perform Grooming: with modified independence Pt Will Perform Lower Body Dressing: with modified independence Pt Will Transfer to Toilet: with modified independence Pt Will Perform Toileting - Clothing Manipulation and hygiene: with modified independence Pt/caregiver will Perform Home Exercise Program: Increased strength  Plan Discharge plan needs to be updated    Co-evaluation                 AM-PAC OT "6 Clicks" Daily Activity     Outcome Measure   Help from another person eating meals?: None Help from another person taking care of personal grooming?: A Little Help from another person toileting, which includes using toliet, bedpan, or urinal?: A Little Help from another person bathing (including washing, rinsing, drying)?: A Little Help from another person to put on and taking off regular upper body clothing?: A Little Help from another person to put on and taking off regular lower body clothing?: A Little 6 Click Score: 19    End of Session    OT Visit Diagnosis: Cognitive communication deficit (R41.841) Symptoms and signs involving cognitive functions: Nontraumatic intracerebral hemorrhage   Activity Tolerance Patient tolerated treatment well   Patient Left in bed;with call bell/phone within  reach;with bed alarm set   Nurse Communication Mobility status        Time: 2194-7125 OT Time Calculation (min): 45 min  Charges: OT General Charges $OT Visit: 1 Visit OT Treatments $Self Care/Home Management : 38-52 mins  Nilsa Nutting., OTR/L Acute Rehabilitation Services Pager [phone] Office [phone]    Lucille Passy M 09/04/2019, 4:20 PM

## 2019-09-04 NOTE — Progress Notes (Signed)
STROKE TEAM PROGRESS NOTE   INTERVAL HISTORY  Pt lying in bed, prone position. She is awake alert and still has partial global aphasia. Moving extremities well. PT/OT Friday recommend CIR, however, CIR considered pt high functioning and signed off. [facility] but now asking to reconsider CIR offer. Currently pending PT/OT reevaluation. Still complains of HA, ordered fioricet PRN  OBJECTIVE Vitals:   09/03/19 1932 09/03/19 2325 09/04/19 0315 09/04/19 0755  BP: 121/70 105/75 99/72 112/68  Pulse: 85 93 79 84  Resp: 18 19 18 14   Temp: 98.8 F (37.1 C) 98.1 F (36.7 C) 98.7 F (37.1 C) 98.5 F (36.9 C)  TempSrc: Oral Oral Oral Oral  SpO2: 100% 99% 100% 100%  Weight:      Height:        CBC:  Recent Labs  Lab 09/03/19 0423 09/04/19 0634  WBC 8.9 8.2  HGB 11.1* 11.7*  HCT 34.5* 36.5  MCV 93.0 93.1  PLT 374 415*    Basic Metabolic Panel:  Recent Labs  Lab 09/03/19 0423 09/04/19 0634  NA 141 140  K 3.2* 3.9  CL 107 106  CO2 21* 24  GLUCOSE 120* 108*  BUN <5* 5*  CREATININE 0.56 0.64  CALCIUM 9.3 9.6    Lipid Panel:     Component Value Date/Time   CHOL 211 (H) 08/25/2019 1139   TRIG 153 (H) 08/25/2019 1139   TRIG 153 (H) 08/25/2019 1139   HDL 56 08/25/2019 1139   CHOLHDL 3.8 08/25/2019 1139   VLDL 31 08/25/2019 1139   LDLCALC 124 (H) 08/25/2019 1139   HgbA1c:  Lab Results  Component Value Date   HGBA1C 4.7 (L) 08/25/2019   Urine Drug Screen:     Component Value Date/Time   LABOPIA NONE DETECTED 08/25/2019 0613   COCAINSCRNUR NONE DETECTED 08/25/2019 0613   LABBENZ NONE DETECTED 08/25/2019 0613   AMPHETMU NONE DETECTED 08/25/2019 0613   THCU POSITIVE (A) 08/25/2019 0613   LABBARB NONE DETECTED 08/25/2019 0613    Alcohol Level     Component Value Date/Time   ETH <10 08/25/2019 0608    IMAGING   Ct Head Wo Contrast 08/30/2019 1. Unchanged left temporal and left lateral intraventricular hemorrhage. No hydrocephalus. 2.  Linear high-density along the left frontal convexity could be postoperative blood products but is notably tubular and could also be a thrombosed vein of Trolard. Consider venous imaging.   CT Head Wo Contrast 08/26/2019 Stable since yesterday. No new or worsening finding. Status post evacuation of a left temporoparietal intraparenchymal hematoma.  No evidence of recurrent bleeding. Residual stable hemorrhage as described above.  Mild mass effect with left-to-right shift of 4 mm.  Ct Head Wo Contrast 08/25/2019 1. Postoperative changes from interval left craniotomy and left temporoparietal parenchymal hematoma evacuation. Persistent hemorrhage in the left temporal region measures 2.6 x 1.4 x 2.7 cm, and may be within a dilated left temporal horn or parenchymal and effacing the left temporal.  2. Persistent large volume hemorrhage within the left lateral ventricle. Small amount of hemorrhage also present within the third and now fourth ventricles.  3. Interval placement of a right frontal approach ventricular catheter terminating near the right foramen of Monro. The right lateral, third and fourth ventricles do not appear dilated.  4. Small volume subarachnoid hemorrhage overlying the left cerebral hemisphere.  5. Decreased mass effect with interval decrease in rightward midline shift, now 5 mm.   Ct Angio Head W Or Wo Contrast Ct  Angio Neck W Or Wo Contrast 08/25/2019 1. Normal variant CTA Circle of Willis without significant proximal stenosis, aneurysm, or branch vessel occlusion. No acute or focal lesion to explain the left temporal lobe hemorrhage.  2. Normal CTA of the neck.  3. Endotracheal tube terminates 2 cm above the carina and could be pulled back 1-2 cm for more optimal positioning.   Ct Head Wo Contrast 08/25/2019 1. Large left temporal and parietal hematoma measures 7.8 x 3.3 x 3.0 cm.  2. Intraventricular extension of hemorrhage.  3. Diffuse left hemispheric mass effect with  midline shift measuring 8 mm.   Diagnostic cerebral catheter angiogram: 08/31/2019 :suggestive of thrombosis of the left vein of Trolard   The dural venous sinuses remain widely patent. There are no intracranial aneurysms, arteriovenous malformations, or high-flow fistulas identified  Dg Chest Port 1 View 08/25/2019 ETT in good position. OG tube courses into the stomach and below the film. No acute disease.  08/25/2019 No acute cardiopulmonary findings.   2D Echocardiogram 1. Left ventricular ejection fraction, by visual estimation, is 60 to 65%. The left ventricle has normal function. There is no left ventricular hypertrophy.  2. The left ventricle has no regional wall motion abnormalities.  3. Global right ventricle has normal systolic function.The right ventricular size is normal. No increase in right ventricular wall thickness.  4. Left atrial size was normal.  5. Right atrial size was normal.  6. The mitral valve is normal in structure. Trace mitral valve regurgitation. No evidence of mitral stenosis.  7. The tricuspid valve is normal in structure. Tricuspid valve regurgitation is not demonstrated.  8. The aortic valve is normal in structure. Aortic valve regurgitation is not visualized. No evidence of aortic valve sclerosis or stenosis.  9. The pulmonic valve was normal in structure. Pulmonic valve regurgitation is not visualized. 10. The inferior vena cava is normal in size with <50% respiratory variability, suggesting right atrial pressure of 8 mmHg.  ECG - SR rate 60 BPM. (See cardiology reading for complete details)   PHYSICAL EXAM    Temp:  [98.1 F (36.7 C)-99.2 F (37.3 C)] 98.5 F (36.9 C) (12/14 0755) Pulse Rate:  [79-93] 84 (12/14 0755) Resp:  [8-19] 14 (12/14 0755) BP: (99-121)/(68-78) 112/68 (12/14 0755) SpO2:  [99 %-100 %] 100 % (12/14 0755)  General - Well nourished, well developed, in no apparent distress.  Ophthalmologic - fundi not visualized due to  noncooperation.  Cardiovascular - Regular rhythm and rate.  Neuro - awake alert, but partial global aphasia, able to say "yes" "I do not know", anomia, able to repeat simple sentences but not complex sentences. Following most midline and peripheral commands, but not all of them. Moving all extremities well except RUE proximal deltoid 4+/5. Sensation symmetrical, FTN not quite cooperative. Gait not tested.    ASSESSMENT/PLAN Amanda Small is a 38 y.o. female with history of recent pregnancy with delivery in August presenting with HA, AMS, right sided weakness, and left gaze preference. She did not receive IV t-PA due to hemorrhage. Left temporal craniotomy and placement of EVD catheter 08/25/19. Will need cerebral angiogram at some point - Dr Conchita Paris.  Stroke: Large left temporal and parietal ICH w/ IVH s/p crani w/ evacuation and EVD placement- unknown etiology ?  Vein of Trolard thrombosis - unusual for it to occur nearly 3 months after pregnancy  CT head - 08/25/19 - Large left temporal and parietal hematoma. IVH. Diffuse left hemispheric mass effect with midline shift measuring  8 mm.   Repeat CTH - 08/25/19 - Postoperative L craniotomy and L ICH evacuation. Persistent hemorrhage L temporal region. Persistent large L lateral ventricle. IVH in third and now fourth ventricles. Ventricles not dilated. Small L cerebral SAH . Decreased mass effect with R midline shift, now 5 mm.  CTA head and neck Unremarkable, however, abnormal left parietal vascular signals   CTH - 08/26/19 - Stable. Mild mass effect with left-to-right shift of 4 mm.  Repeat CT 12/9 unchanged L temporal lobe and L lateral IVH. No hydro. Linear density L frontal convexity.   Repeat CT head in am - given continued HA  2D Echo - EF 60-65%. No source of embolus    [employer] Virus 2 - negative  LDL - 124  HgbA1c - 4.7  UDS - THCU  VTE prophylaxis - SCDs  No antithrombotic prior to admission, now on No  antithrombotic  Therapy recommendations:  CIR->too high level per rehab coordinator. Needs 24/7 supervision. Sister asking CIR consideration again  Disposition:  Pending  S/p left temporal craniotomy and EVD   On Keppra  S/p EVD 12/4  EVD draining, pt tolerating well  EVD removed 12/10  Repeat CT 12/9 unchanged L temporal lobe and L lateral IVH. No hydro.   NSG following  Intermittent HA - on fioricet PRN  Left vein of Trolard thrombosis  Initial CTA head and neck 12/4 - left parietal cortical abnormal vascular signal  CT head 12/9 Linear density L frontal convexity ? Thrombosed vein.   Cerebral angiogram 08/31/19 - Dr. Kathyrn Sheriff - suggestive of thrombosis of the left vein of Trolard as described above. The dural venous sinuses remain widely patent. There are no intracranial aneurysms, arteriovenous malformations, or high-flow fistulas identified.  Concerning for CVT for the ICH/IVH, however, atypical for postpartum hypercoagulability after 3 months of delivery and without other evidence of venous thrombosis  LE venous doppler neg  Hypercoagulable negative except protein S activity slight lower 60  Agree with Dr. Leonie Man - "Given significant amount of intraventricular and parenchymal blood it would be risky to start anticoagulation at this time and will continue aspirin for now but she may need anticoagulation in the near future only if she has a hypercoagulable disorder-showed isolated vein of Trolard thrombosis in 3 months postpartum situation with significant amount of intraventricular and parenchymal hemorrhage is indication for anticoagulation"  Blood Pressure   Home BP meds: none, no hx HTN  Current BP meds: hydralazine prn SBP > 160   BP stable on the lower end . Long-term BP goal normotensive . Encourage po intake . Off IVF  Hyperlipidemia  Home Lipid lowering medication: none  LDL 124  Current lipid lowering medication: none  Consider statin at  discharge  Dysphagia, resolved . Secondary to stroke . Now on Vegetarian diet w/ thin liquids   Other Stroke Risk Factors  Obesity, Body mass index is 29.03 kg/m., recommend weight loss, diet and exercise as appropriate   THC use  Other Active Problems  Recent pregnancy with delivery in August, breast feeding/pumping  Leukocytosis, WBC 12.9->9.4->8.7->9.6->8.9->8.2 - off ancef  Hypokalemia - 3.1->3.4->3.5->3.0->3.2->3.9  Bradycardia - 30's - 08/26/19 - 9:37 pm - atropine per eLink - currently not on meds that should cause bradycardia except prn Labetalol - changed to hydralazine - monitor - remains in the 50s - 90s  Postpartum anemia, improving - Hb - 10.1->10.3->11.1->11.7  Hospital day #10  Rosalin Hawking, MD PhD Stroke Neurology 09/04/2019 11:55 AM  To contact Stroke Continuity  provider, please refer to [email or web address] After hours, contact General Neurology

## 2019-09-04 NOTE — TOC Progression Note (Addendum)
Transition of Care [facility]) - Progression Note    Patient Details  Name: AZARRIA BALINT MRN: [id number] Date of Birth: Aug 27, 1981  Transition of Care [facility]) CM/SW Contact  Pollie Friar, RN Phone Number: 09/04/2019, 3:47 PM  Clinical Narrative:    CM asked CIR to reassess patient for admission. Per CIR she is still doing too well for an admission to CIR.  CM has attempted to reach patients sister. Message left.  CM following.  1635 Addendum: spoke with sister: Aldona Bar and Dr Erlinda Hong. Plan is for patient to d/c home tomorrow with [facility] services. Aldona Bar states the patient will have 24 hour supervision at home.  CM will follow up in am.   Expected Discharge Plan: IP Rehab Facility(pending insurance and bed availability, otherwise HH) Barriers to Discharge: Continued Medical Work up  Expected Discharge Plan and Services Expected Discharge Plan: IP Rehab Facility(pending insurance and bed availability, otherwise HH)                                               Social Determinants of Health (SDOH) Interventions    Readmission Risk Interventions No flowsheet data found.

## 2019-09-05 ENCOUNTER — Inpatient Hospital Stay (HOSPITAL_COMMUNITY): Payer: 59

## 2019-09-05 DIAGNOSIS — E669 Obesity, unspecified: Secondary | ICD-10-CM | POA: Diagnosis present

## 2019-09-05 DIAGNOSIS — I619 Nontraumatic intracerebral hemorrhage, unspecified: Secondary | ICD-10-CM

## 2019-09-05 DIAGNOSIS — G08 Intracranial and intraspinal phlebitis and thrombophlebitis: Secondary | ICD-10-CM

## 2019-09-05 DIAGNOSIS — E876 Hypokalemia: Secondary | ICD-10-CM | POA: Diagnosis not present

## 2019-09-05 DIAGNOSIS — E785 Hyperlipidemia, unspecified: Secondary | ICD-10-CM | POA: Diagnosis present

## 2019-09-05 DIAGNOSIS — F1290 Cannabis use, unspecified, uncomplicated: Secondary | ICD-10-CM | POA: Diagnosis present

## 2019-09-05 LAB — BASIC METABOLIC PANEL
Anion gap: 10 (ref 5–15)
BUN: 5 mg/dL — ABNORMAL LOW (ref 6–20)
CO2: 24 mmol/L (ref 22–32)
Calcium: 9.7 mg/dL (ref 8.9–10.3)
Chloride: 102 mmol/L (ref 98–111)
Creatinine, Ser: 0.78 mg/dL (ref 0.44–1.00)
GFR calc Af Amer: >60 mL/min (ref >60)
GFR calc non Af Amer: >60 mL/min (ref >60)
Glucose, Bld: 110 mg/dL — ABNORMAL HIGH (ref 70–99)
Potassium: 3.4 mmol/L — ABNORMAL LOW (ref 3.5–5.1)
Sodium: 136 mmol/L (ref 135–145)

## 2019-09-05 LAB — CBC
HCT: 37.9 % (ref 36.0–46.0)
Hemoglobin: 12.4 g/dL (ref 12.0–15.0)
MCH: 29.5 pg (ref 26.0–34.0)
MCHC: 32.7 g/dL (ref 30.0–36.0)
MCV: 90 fL (ref 80.0–100.0)
Platelets: 446 10*3/uL — ABNORMAL HIGH (ref 150–400)
RBC: 4.21 MIL/uL (ref 3.87–5.11)
RDW: 13.1 % (ref 11.5–15.5)
WBC: 8.6 10*3/uL (ref 4.0–10.5)
nRBC: 0 % (ref 0.0–0.2)

## 2019-09-05 LAB — ANTINUCLEAR ANTIBODIES, IFA: ANA Ab, IFA: NEGATIVE

## 2019-09-05 MED ORDER — ASPIRIN 81 MG PO TBEC: 81.0000 mg | DELAYED_RELEASE_TABLET | Freq: Every day | ORAL | Status: DC

## 2019-09-05 MED ORDER — LEVETIRACETAM 500 MG PO TABS: 500.0000 mg | ORAL_TABLET | Freq: Two times a day (BID) | ORAL | 0 refills | Status: DC

## 2019-09-05 MED ORDER — ACETAMINOPHEN 325 MG PO TABS: 650.0000 mg | ORAL_TABLET | Freq: Four times a day (QID) | ORAL | Status: DC | PRN

## 2019-09-05 MED ORDER — ATORVASTATIN CALCIUM 10 MG PO TABS: 10.0000 mg | ORAL_TABLET | Freq: Every day | ORAL | 2 refills | Status: DC

## 2019-09-05 MED ORDER — BUTALBITAL-APAP-CAFFEINE 50-325-40 MG PO TABS: 1.0000 | ORAL_TABLET | Freq: Three times a day (TID) | ORAL | 0 refills | Status: DC | PRN

## 2019-09-05 MED ORDER — POTASSIUM CHLORIDE CRYS ER 20 MEQ PO TBCR
40.0000 meq | EXTENDED_RELEASE_TABLET | Freq: Once | ORAL | Status: AC
  Administered 2019-09-05: 40 meq via ORAL
  Filled 2019-09-05: qty 2

## 2019-09-05 MED FILL — levETIRAcetam 500 MG TABS: 500 | 14 days supply | Qty: 28 | Fill #0

## 2019-09-05 MED FILL — ATORVASTATIN CALCIUM 10 MG: 10 | 30 days supply | Qty: 30 | Fill #0

## 2019-09-05 NOTE — Discharge Summary (Addendum)
Stroke Discharge Summary  Patient ID: Amanda Small   MRN: [id number]      DOB: 05-23-1981  Date of Admission: 08/25/2019 Date of Discharge: 09/05/2019  Attending Physician:  Marvel Plan, MD, Stroke MD Consultant(s):   Coletta Memos, MD (neurosurgery), Sula Soda, MD (Physical Medicine & Rehabtilitation) Patient's PCP:  Patient, No Pcp Per  DISCHARGE DIAGNOSIS:  Principal Problem:   ICH (intracerebral hemorrhage) (HCC)-L ICH w/ IVH s/p crani and EVD Active Problems:   Cytotoxic brain edema (HCC)   Obstructive hydrocephalus (HCC) s/p EVD   Aphasia due to acute stroke (HCC)   Intracranial venous thrombosis - L vein of Trolard   Hyperlipemia   Obesity   Marijuana use   Hypokalemia   Past Surgical History:  Procedure Laterality Date  . CRANIOTOMY Left 08/25/2019   Procedure: LEFT CRANIECTOMY;  Surgeon: Coletta Memos, MD;  Location: [facility] OR;  Service: Neurosurgery;  Laterality: Left;  . IR ANGIO INTRA EXTRACRAN SEL INTERNAL CAROTID BILAT MOD SED  08/31/2019  . IR ANGIO VERTEBRAL SEL VERTEBRAL UNI L MOD SED  08/31/2019  . VENTRICULOSTOMY Right 08/25/2019   Procedure: RIGHT FRONTAL VENTRICULAR CATHETER;  Surgeon: Coletta Memos, MD;  Location: [facility] OR;  Service: Neurosurgery;  Laterality: Right;    Allergies as of 09/05/2019   No Known Allergies     Medication List    STOP taking these medications   meloxicam 15 MG tablet Commonly known as: MOBIC     TAKE these medications   acetaminophen 325 MG tablet Commonly known as: TYLENOL Take 2 tablets (650 mg total) by mouth every 6 (six) hours as needed for headache.   aspirin 81 MG EC tablet Take 1 tablet (81 mg total) by mouth daily. Start taking on: September 06, 2019   atorvastatin 10 MG tablet Commonly known as: Lipitor Take 1 tablet (10 mg total) by mouth daily.   butalbital-acetaminophen-caffeine 50-325-40 MG tablet Commonly known as: FIORICET Take 1 tablet by mouth every 8 (eight) hours as needed for  headache. Take in place of regular tylenol if needed ( do not take this and tylenol at this same time as this has tylenol in it and may be too much)   cyclobenzaprine 10 MG tablet Commonly known as: FLEXERIL Take 10 mg by mouth 3 (three) times daily.   levETIRAcetam 500 MG tablet Commonly known as: KEPPRA Take 1 tablet (500 mg total) by mouth 2 (two) times daily.       LABORATORY STUDIES CBC    Component Value Date/Time   WBC 8.6 09/05/2019 0420   RBC 4.21 09/05/2019 0420   HGB 12.4 09/05/2019 0420   HCT 37.9 09/05/2019 0420   PLT 446 (H) 09/05/2019 0420   MCV 90.0 09/05/2019 0420   MCH 29.5 09/05/2019 0420   MCHC 32.7 09/05/2019 0420   RDW 13.1 09/05/2019 0420   LYMPHSABS 3.3 08/25/2019 0608   MONOABS 0.6 08/25/2019 0608   EOSABS 0.3 08/25/2019 0608   BASOSABS 0.0 08/25/2019 0608   CMP    Component Value Date/Time   NA 136 09/05/2019 0420   K 3.4 (L) 09/05/2019 0420   CL 102 09/05/2019 0420   CO2 24 09/05/2019 0420   GLUCOSE 110 (H) 09/05/2019 0420   BUN 5 (L) 09/05/2019 0420   CREATININE 0.78 09/05/2019 0420   CALCIUM 9.7 09/05/2019 0420   PROT 6.5 08/25/2019 0608   ALBUMIN 3.8 08/25/2019 0608   AST 28 08/25/2019 0608   ALT 19 08/25/2019 7829  ALKPHOS 61 08/25/2019 0608   BILITOT 0.8 08/25/2019 0608   GFRNONAA >60 09/05/2019 0420   GFRAA >60 09/05/2019 0420   Lipid Panel    Component Value Date/Time   CHOL 211 (H) 08/25/2019 1139   TRIG 153 (H) 08/25/2019 1139   TRIG 153 (H) 08/25/2019 1139   HDL 56 08/25/2019 1139   CHOLHDL 3.8 08/25/2019 1139   VLDL 31 08/25/2019 1139   LDLCALC 124 (H) 08/25/2019 1139   HgbA1C  Lab Results  Component Value Date   HGBA1C 4.7 (L) 08/25/2019   Urinalysis    Component Value Date/Time   COLORURINE YELLOW 08/25/2019 0709   APPEARANCEUR CLEAR 08/25/2019 0709   LABSPEC 1.024 08/25/2019 0709   PHURINE 5.0 08/25/2019 0709   GLUCOSEU NEGATIVE 08/25/2019 0709   HGBUR NEGATIVE 08/25/2019 0709   BILIRUBINUR NEGATIVE  08/25/2019 0709   KETONESUR 5 (A) 08/25/2019 0709   PROTEINUR 30 (A) 08/25/2019 0709   NITRITE NEGATIVE 08/25/2019 0709   LEUKOCYTESUR NEGATIVE 08/25/2019 0709   Urine Drug Screen     Component Value Date/Time   LABOPIA NONE DETECTED 08/25/2019 0613   COCAINSCRNUR NONE DETECTED 08/25/2019 0613   LABBENZ NONE DETECTED 08/25/2019 0613   AMPHETMU NONE DETECTED 08/25/2019 0613   THCU POSITIVE (A) 08/25/2019 0613   LABBARB NONE DETECTED 08/25/2019 0613    Alcohol Level    Component Value Date/Time   ETH <10 08/25/2019 4098     SIGNIFICANT DIAGNOSTIC STUDIES  CT HEAD WO CONTRAST  09/05/2019 1. Continued interval evolution of subacute left temporal hemorrhage, decreased in size with decreased mass effect from previous. 2. Persistent but decreased intraventricular hemorrhage, with interval removal of right frontal ventriculostomy. No hydrocephalus or ventricular trapping. 3. Faint residual hyperdensity at previously seen thrombosed vein of Trolard, improved from previous. 4. No other new acute intracranial abnormality.    Ct Head Wo Contrast 08/30/2019 1. Unchanged left temporal and left lateral intraventricular hemorrhage. No hydrocephalus. 2. Linear high-density along the left frontal convexity could be postoperative blood products but is notably tubular and could also be a thrombosed vein of Trolard. Consider venous imaging.   CT Head Wo Contrast 08/26/2019 Stable since yesterday. No new or worsening finding. Status post evacuation of a left temporoparietal intraparenchymal hematoma.  No evidence of recurrent bleeding. Residual stable hemorrhage as described above.  Mild mass effect with left-to-right shift of 4 mm.  Ct Head Wo Contrast 08/25/2019 1. Postoperative changes from interval left craniotomy and left temporoparietal parenchymal hematoma evacuation. Persistent hemorrhage in the left temporal region measures 2.6 x 1.4 x 2.7 cm, and may be within a dilated left temporal horn  or parenchymal and effacing the left temporal.  2. Persistent large volume hemorrhage within the left lateral ventricle. Small amount of hemorrhage also present within the third and now fourth ventricles.  3. Interval placement of a right frontal approach ventricular catheter terminating near the right foramen of Monro. The right lateral, third and fourth ventricles do not appear dilated.  4. Small volume subarachnoid hemorrhage overlying the left cerebral hemisphere.  5. Decreased mass effect with interval decrease in rightward midline shift, now 5 mm.   Ct Angio Head W Or Wo Contrast Ct Angio Neck W Or Wo Contrast 08/25/2019 1. Normal variant CTA Circle of Willis without significant proximal stenosis, aneurysm, or branch vessel occlusion. No acute or focal lesion to explain the left temporal lobe hemorrhage.  2. Normal CTA of the neck.  3. Endotracheal tube terminates 2 cm above the carina and  could be pulled back 1-2 cm for more optimal positioning.   Ct Head Wo Contrast 08/25/2019 1. Large left temporal and parietal hematoma measures 7.8 x 3.3 x 3.0 cm.  2. Intraventricular extension of hemorrhage.  3. Diffuse left hemispheric mass effect with midline shift measuring 8 mm.   Diagnostic cerebral catheter angiogram: 08/31/2019 :suggestive of thrombosis of the left vein of Trolard   The dural venous sinuses remain widely patent. There are no intracranial aneurysms, arteriovenous malformations, or high-flow fistulas identified  Dg Chest Port 1 View 08/25/2019 ETT in good position. OG tube courses into the stomach and below the film. No acute disease.  08/25/2019 No acute cardiopulmonary findings.   2D Echocardiogram 1. Left ventricular ejection fraction, by visual estimation, is 60 to 65%. The left ventricle has normal function. There is no left ventricular hypertrophy. 2. The left ventricle has no regional wall motion abnormalities. 3. Global right ventricle has normal systolic  function.The right ventricular size is normal. No increase in right ventricular wall thickness. 4. Left atrial size was normal. 5. Right atrial size was normal. 6. The mitral valve is normal in structure. Trace mitral valve regurgitation. No evidence of mitral stenosis. 7. The tricuspid valve is normal in structure. Tricuspid valve regurgitation is not demonstrated. 8. The aortic valve is normal in structure. Aortic valve regurgitation is not visualized. No evidence of aortic valve sclerosis or stenosis. 9. The pulmonic valve was normal in structure. Pulmonic valve regurgitation is not visualized. 10. The inferior vena cava is normal in size with <50% respiratory variability, suggesting right atrial pressure of 8 mmHg.  ECG - SR rate 60 BPM. (See cardiology reading for complete details)    HISTORY OF PRESENT ILLNESS Amanda Small is a 38 y.o. female with no significant past medical history, last known well 11 PM on 08/24/2019 when she went to bed, woke up this morning with a headache and neck pain.  Recently had a child and is still breast-feeding-delivered in August, was taking care of the child and told her mother that she cannot lift the child and was experiencing right-sided weakness.  Became confused, was noted by family to not be responding appropriately to questions.  EMS called and patient brought into the emergency room where a stat head CT was done that showed a large left temporal ICH with extension into the ventricles. Stat neurological consultation was placed, while the patient was being intubated for being unable to protect the airway.  Advised ED provider to activate code stroke. Evaluated the patient-had been intubated at the time of neurological evaluation.  Chemical paralysis used for intubation hence neurological exam was very limited.  According to the triage nurse, upon arrival patient was able to say her name and was mumbling, she was moving all her extremities was  definitely weaker on the right upper extremity compared to the left upper extremity.  She was blinking to threat from the left but probably not so much from the right and had a leftward gaze preference.  Based on initial nursing report, GCS was around 12. Patient sister at bedside denied any knowledge of patient abusing any drugs.  She says she uses marijuana.  No history of prior strokes or bleeds in the head.  No history of heart attacks. She did not know if the patient was feeling sick prior to presentation but she called the patient's best friend to confirm if she was doing any drugs or was feeling sick and the answers to both of those  questions were no.  Pregnancy was unremarkable.  No history of high blood pressures.  No history of blood thinner use. Blood pressures on arrival were systolic 90s to 100s.  Blood pressures went into the 160s after intubation.  Propofol and Cleviprex were ordered.  Heart rate was fluctuating between 40s to 90s with frequent PVCs. Premorbid modified Rankin scale (mRS):0  HOSPITAL COURSE [name] is a 38 y.o. female with history of recent pregnancy with delivery in August presenting with HA, AMS, right sided weakness, and left gaze preference. She did not receive IV t-PA due to hemorrhage. Left temporal craniotomy and placement of EVD catheter 08/25/19.   Stroke: Large left temporal and parietal ICH w/ IVH s/p crani w/ evacuation and EVD placement- unknown etiology ?  Vein of Trolard thrombosis - unusual for it to occur nearly 3 months after pregnancy  CT head - 08/25/19 - Large left temporal and parietal hematoma. IVH. Diffuse left hemispheric mass effect with midline shift measuring 8 mm.   Repeat CTH - 08/25/19 - Postoperative L craniotomy and L ICH evacuation. Persistent hemorrhage L temporal region. Persistent large L lateral ventricle. IVH in third and now fourth ventricles. Ventricles not dilated. Small L cerebral SAH . Decreased mass effect with R  midline shift, now 5 mm.  CTA head and neck Unremarkable, however, abnormal left parietal vascular signals   CTH - 08/26/19 - Stable. Mild mass effect with left-to-right shift of 4 mm.  Repeat CT 12/9 unchanged L temporal lobe and L lateral IVH. No hydro. Linear density L frontal convexity.   Repeat CT head 12/15 stable  2D Echo - EF 60-65%. No source of embolus    [employer] Corona Virus 2 - negative  LDL - 124  HgbA1c - 4.7  UDS - THCU  No antithrombotic prior to admission, now on ASPIRIN 81  Therapy recommendations:  CIR->too high level. Needs 24/7 supervision.   Disposition:  return homne with HH PT, OT, SLP  S/p left temporal craniotomy and EVD   On Keppra  S/p EVD 12/4  EVD draining, pt tolerating well  EVD removed 12/10  Repeat CT 12/9 unchanged L temporal lobe and L lateral IVH. No hydro.   Repeat CT 12/15 no hydro  NSG following  Intermittent HA - on fioricet PRN  D/c staples day of discharge, confirmed with [facility]  Left vein of Trolard thrombosis  Initial CTA head and neck 12/4 - left parietal cortical abnormal vascular signal  CT head 12/9 Linear density L frontal convexity ? Thrombosed vein.   Cerebral angiogram 08/31/19 - Dr. Conchita ParisNundkumar - suggestive of thrombosis of the left vein of Trolard as described above. The dural venous sinuses remain widely patent. There are no intracranial aneurysms, arteriovenous malformations, or high-flow fistulas identified.  Concerning for CVT for the ICH/IVH, however, atypical for postpartum hypercoagulability after 3 months of delivery and without other evidence of venous thrombosis  LE venous doppler neg  Hypercoagulable negative except protein S activity slight lower 60  Agree with Dr. Pearlean BrownieSethi - "Given significant amount of intraventricular and parenchymal blood it would be risky to start anticoagulation at this time and will continue aspirin for now but she may need anticoagulation in the near future only if she has  a hypercoagulable disorder-showed isolated vein of Trolard thrombosis in 3 months postpartum situation with significant amount of intraventricular and parenchymal hemorrhage is indication for anticoagulation"  Blood Pressure   Home BP meds: none, no hx HTN  Current BP meds: hydralazine  prn SBP > 160   BP stable on the lower end  Long-term BP goal normotensive  Encourage po intake  Hyperlipidemia  Home Lipid lowering medication: none  LDL 124  Current lipid lowering medication: none  Consider statin at discharge  Dysphagia, resolved  Secondary to stroke  Now on Vegetarian diet w/ thin liquids   Other Stroke Risk Factors  Obesity, Body mass index is 29.03 kg/m., recommend weight loss, diet and exercise as appropriate   THC use  Other Active Problems  Recent pregnancy with delivery in August, breast feeding/pumping  Leukocytosis, WBC 12.9->9.4->8.7->9.6->8.9->8.2->8.6 - off ancef  Hypokalemia - 3.1->3.4->3.5->3.0->3.2->3.9->3.4  Bradycardia - 30's - 08/26/19 - 9:37 pm - atropine per eLink - currently not on meds that should cause bradycardia except prn Labetalol - changed to hydralazine - monitor - remains in the 50s - 90s  Postpartum anemia, resolved - Hb - 10.1->10.3->11.1->11.7->12.4  DISCHARGE EXAM Blood pressure 106/79, pulse 99, temperature 98.2 F (36.8 C), temperature source Oral, resp. rate 18, height 5\' 8"  (1.727 m), weight 86.6 kg, SpO2 100 %. General - Well nourished, well developed, in no apparent distress.  Ophthalmologic - fundi not visualized due to noncooperation.  Cardiovascular - Regular rhythm and rate.  Neuro - awake alert, but partial global aphasia, able to say "yes" "I do not know", anomia, able to repeat simple sentences but not complex sentences. Following most midline and peripheral commands, but not all of them. Moving all extremities well except RUE proximal deltoid 4+/5. Sensation symmetrical, FTN not quite cooperative. Gait  not tested.   Discharge Diet   Vegetarian, thin liquids  DISCHARGE PLAN  Disposition:  Return home  Home Health PT, OT, SLP  ASPIRIN 81 MG DAILY given thrombosis, not an anticoagulation candidate given hemorrhage. No additional aspirin or aspirin products at this time.   Ongoing stroke risk factor control by Primary Care Physician at time of discharge  Follow-up PCP in 2 weeks, get one if you do not have.  Follow-up in [facility] with Dr. in 4 weeks, office to schedule an appointment.   35 minutes were spent preparing discharge.  Pearlean Brownie, MD PhD Stroke Neurology 09/05/2019 6:12 PM

## 2019-09-05 NOTE — TOC Transition Note (Signed)
Transition of Care [facility]) - CM/SW Discharge Note   Patient Details  Name: Amanda Small MRN: [id number] Date of Birth: 21-Sep-1981  Transition of Care [facility]) CM/SW Contact:  Pollie Friar, RN Phone Number: 09/05/2019, 12:48 PM   Clinical Narrative:    Pt discharging home with family that state they can provide 24 hour supervision. Pt will have [place] services through [place]. Tommi Rumps with [facility] has accepted the referral.  Sister to provide transportation home.    Final next level of care: Home w Home Health Services Barriers to Discharge: No Barriers Identified   Patient Goals and CMS Choice   CMS Medicare.gov Compare Post Acute Care list provided to:: Patient Represenative (must comment) Choice offered to / list presented to : Sibling  Discharge Placement                       Discharge Plan and Services                          HH Arranged: PT, OT, Speech Therapy HH Agency: [place] Date [place]: 09/05/19   Representative spoke with at [place]: [place] ([place]) Interventions     Readmission Risk Interventions No flowsheet data found.

## 2019-09-05 NOTE — Progress Notes (Signed)
Physical Therapy Treatment Patient Details Name: Amanda Small MRN: [id number] DOB: 19-Nov-1980 Today's Date: 09/05/2019    History of Present Illness Pt is a 38 year old woman with unheralded onset of headache and inability to use her right side. CT revealed large left temporal ICH with intraventricular extension. Pt was intubated for airway protection. PT s/p left temporal craniotomy for hematoma evacuation and right frontal ventricular catheter placement. No pertinent history.    PT Comments    Pt mobilizing well at mod I level for bed mobility and supervision for transfers and ambulation. Follows simple commands with increased time. Continues to be significantly limited by expressive aphasia and lack of safety awareness. Plan is home with family.     Follow Up Recommendations  Supervision/Assistance - 24 hour;Home health PT     Equipment Recommendations  None recommended by PT    Recommendations for Other Services       Precautions / Restrictions Precautions Precautions: Fall Precaution Comments: EVD removed Restrictions Weight Bearing Restrictions: No    Mobility  Bed Mobility Overal bed mobility: Modified Independent Bed Mobility: Supine to Sit;Sit to Supine     Supine to sit: Modified independent (Device/Increase time) Sit to supine: Modified independent (Device/Increase time)   General bed mobility comments: in and out of bed without difficulty  Transfers Overall transfer level: Needs assistance Equipment used: None Transfers: Sit to/from Stand Sit to Stand: Supervision         General transfer comment: supervision from bed and toilet, no LOB  Ambulation/Gait Ambulation/Gait assistance: Supervision Gait Distance (Feet): 500 Feet Assistive device: None Gait Pattern/deviations: Step-through pattern Gait velocity: WFL Gait velocity interpretation: >2.62 ft/sec, indicative of community ambulatory General Gait Details: worked on changes in direction,  bkwd walking and pathfinding, pt enjoyed Christmas Product manager    Modified Rankin (Stroke Patients Only) Modified Rankin (Stroke Patients Only) Pre-Morbid Rankin Score: No symptoms Modified Rankin: Moderate disability     Balance Overall balance assessment: Needs assistance Sitting-balance support: No upper extremity supported Sitting balance-Leahy Scale: Good     Standing balance support: No upper extremity supported Standing balance-Leahy Scale: Fair Standing balance comment: able to maintain static standing with no LOB, and dynamic tasks with supervision and UE support                             Cognition Arousal/Alertness: Awake/alert Behavior During Therapy: WFL for tasks assessed/performed Overall Cognitive Status: Difficult to assess Area of Impairment: Attention;Safety/judgement;Awareness;Problem solving                 Orientation Level: (however unsure as pt has expressive and receptive aphasia) Current Attention Level: Sustained Memory: Decreased recall of precautions Following Commands: Follows one step commands with increased time Safety/Judgement: Decreased awareness of safety Awareness: Intellectual Problem Solving: Slow processing;Requires verbal cues General Comments: pt following L and R commands with increased time, during pathfinding activities      Exercises      General Comments General comments (skin integrity, edema, etc.): pt in generally good spirits today, used hand gestures to show that she wanted to remove what was left of her hair, followed simple commands consistently throughout session      Pertinent Vitals/Pain Pain Assessment: Faces Faces Pain Scale: Hurts little more Pain Location: Head Pain Descriptors / Indicators: Grimacing(in hallway lights) Pain Intervention(s): Limited activity within patient's tolerance  Home Living                      Prior  Function            PT Goals (current goals can now be found in the care plan section) Acute Rehab PT Goals Patient Stated Goal: none stated PT Goal Formulation: Patient unable to participate in goal setting Time For Goal Achievement: 09/10/19 Potential to Achieve Goals: Good Progress towards PT goals: Progressing toward goals    Frequency    Min 4X/week      PT Plan Current plan remains appropriate    Co-evaluation              AM-PAC PT "6 Clicks" Mobility   Outcome Measure  Help needed turning from your back to your side while in a flat bed without using bedrails?: None Help needed moving from lying on your back to sitting on the side of a flat bed without using bedrails?: None Help needed moving to and from a bed to a chair (including a wheelchair)?: None Help needed standing up from a chair using your arms (e.g., wheelchair or bedside chair)?: None Help needed to walk in hospital room?: A Little Help needed climbing 3-5 steps with a railing? : A Little 6 Click Score: 22    End of Session   Activity Tolerance: Patient tolerated treatment well Patient left: in bed;with call bell/phone within reach;with bed alarm set Nurse Communication: Mobility status PT Visit Diagnosis: Other abnormalities of gait and mobility (R26.89);Other symptoms and signs involving the nervous system (R29.898)     Time: 8786-7672 PT Time Calculation (min) (ACUTE ONLY): 17 min  Charges:  $Gait Training: 8-22 mins                     Leighton Roach, [place]  Pager [phone] Office [place] 09/05/2019, 2:18 PM

## 2019-09-06 NOTE — Progress Notes (Signed)
Occupational Therapy Progress Note  Pt continues to progress with ADLs.  She was able to perform tub transfer with supervision.  She did loose her balance when simulating showering in standing and attempting to bathe feet.  Recommend pt sit to shower and recommend tub transfer bench. Pt was able to find her way back to her room with supervision only.  Progress and current status discussed with sister via phone.    Recommend HHOT, and tub transfer bench    09/05/19 1500  OT Visit Information  Last OT Received On 09/06/19  Assistance Needed +1  History of Present Illness Pt is a 38 year old woman with unheralded onset of headache and inability to use her right side. CT revealed large left temporal ICH with intraventricular extension. Pt was intubated for airway protection. PT s/p left temporal craniotomy for hematoma evacuation and right frontal ventricular catheter placement. No pertinent history.  Precautions  Precautions Fall  Pain Assessment  Pain Assessment Faces  Faces Pain Scale 2  Pain Location Head  Pain Descriptors / Indicators Grimacing  Pain Intervention(s) Monitored during session  Cognition  Arousal/Alertness Awake/alert  Behavior During Therapy WFL for tasks assessed/performed  Overall Cognitive Status Difficult to assess  General Comments Pt able to follow instructions in context.  She was able to perform simple path finding to locate her room with supervision.    Upper Extremity Assessment  Upper Extremity Assessment Overall WFL for tasks assessed  ADL  Lower Body Bathing Minimal assistance  Lower Body Bathing Details (indicate cue type and reason) Pt performed simulated shower in standing with LOB to the Rt when attempting single leg stance to bathe LEs.  With repeated attempts balance improved.  Phoned sister and discussed need for close supervision and tub DME - recommend tub transfer bench, sister agreed.   Tub/ Shower Transfer Tub  transfer;Supervision/safety;Ambulation  Tub/Shower Transfer Details (indicate cue type and reason) able to step over shower with supervision and No LOB  Functional mobility during ADLs Supervision/safety  Bed Mobility  Overal bed mobility Independent  Balance  Overall balance assessment Needs assistance  Sitting-balance support No upper extremity supported  Sitting balance-Leahy Scale Good  Standing balance support No upper extremity supported  Standing balance-Leahy Scale Fair  Standing balance comment able to maintain static standing with no LOB, and dynamic tasks with supervision and UE support   Transfers  Overall transfer level Needs assistance  Equipment used None  Transfers Stand Pivot Transfers;Sit to/from Stand  Sit to Stand Supervision  Stand pivot transfers Supervision  General Comments  General comments (skin integrity, edema, etc.) phoned pt's sister.  Discussed her current functional level.  recommend that they establish a routine with consistency to maximize pt's independence.  discussed tub transfer bench and need to sit to shower.  Sister verbalized understanding   OT - End of Session  Activity Tolerance Patient tolerated treatment well  Patient left in bed;with call bell/phone within reach;with bed alarm set  Nurse Communication Mobility status  OT Assessment/Plan  OT Plan Discharge plan remains appropriate  OT Visit Diagnosis Cognitive communication deficit (R41.841)  Symptoms and signs involving cognitive functions Nontraumatic intracerebral hemorrhage  OT Frequency (ACUTE ONLY) Min 3X/week  Follow Up Recommendations Home health OT;Supervision/Assistance - 24 hour  OT Equipment Tub/shower bench  AM-PAC OT "6 Clicks" Daily Activity Outcome Measure (Version 2)  Help from another person eating meals? 4  Help from another person taking care of personal grooming? 3  Help from another person toileting, which includes using  toliet, bedpan, or urinal? 3  Help from  another person bathing (including washing, rinsing, drying)? 3  Help from another person to put on and taking off regular upper body clothing? 3  Help from another person to put on and taking off regular lower body clothing? 3  6 Click Score 19  OT Goal Progression  Progress towards OT goals Progressing toward goals  OT Time Calculation  OT Start Time (ACUTE ONLY) 1241  OT Stop Time (ACUTE ONLY) 1256  OT Time Calculation (min) 15 min  OT General Charges  $OT Visit 1 Visit  OT Treatments  $Self Care/Home Management  8-22 mins  Nilsa Nutting., OTR/L Acute Rehabilitation Services Pager [phone] Office [phone]

## 2019-09-19 ENCOUNTER — Other Ambulatory Visit: Payer: Self-pay | Admitting: *Deleted

## 2019-09-19 NOTE — Patient Outreach (Signed)
[facility]) Care Management  09/19/2019  MYANGEL SUMMONS 1981/06/14 [id number]   EMMI- stroke D/c from [facility] RED ON EMMI ALERT Day # 9 Date: Friday 09/15/19 1002 patient  Red Alert Reason: Questions/problems with meds? Yes Lost interest in things they used to enjoy?Yes Sad, hopeless, anxious, or empty? Yes  Insurance: united healthcare medicare and medicaid of [place]  [facility] admissions x 1 ED visits x 1 in the last 6 months  Last admission at [facility] 08/25/19 to 09/05/19 for ICH (intracerebral hemorrhage) (HCC)-L ICH w/ IVH s/p crani and EVD   Outreach attempt # 1 successful at the home number Patient is able to verify HIPAA, DOB and address with increased time related to her aphasia It took her only a brief moment to state her date of birth and then make the correction in the year of her date of birth.  [facility] Care Management RN reviewed and addressed red alert with patient During the call she gave permission for [facility] RN CM to speak with her mother and for her sister, [name] to return a call to [facility] RN CM  EMMI:   Ms Dauria reports the EMMI alert answers are correct  Questions/problems with meds? Yes She initially is unable to inform CM what or which medication she had questions with as she is aphasic Her mother assisted her to clarify that she is needing refill of her Keppra and will be seeing her doctor on 09/20/19 She is unable to recall her MD name and at the time of this call Epic is indicating she does not have a primary care provider (PCP) He mother is unable to recall also and states that her sister, [name] is a Engineer, civil (consulting) and knows this answer [facility] RN CM encouraged them to have [name] to return a call to [facility] RN CM to provide the updated primary care provider (PCP) and to see if assistance from [facility] RN CM is needed   Lost interest in things they used to enjoy?Yes Sad, hopeless, anxious, or empty? Yes [facility] RN CM discussed the lost of interest, sad, hopeless, anxious  or empty response and discussed THN SW services but Ms Zegers states she did not think she needed services "right now" [facility] RN CM extended the offer for her to feel free to call [facility] RN CM back if services needed at a later time. She notes THN RN CM number on her phone   [facility] RN CM discussed an outreach letter will also be sent to her with [facility] RN CM contact information  She voiced understanding and appreciation [facility] RN CM discussed awaiting a call back from her sister but also following up with her. She agrees to the follow up call from [facility] RN CM She voices understanding and appreciation.   Social: Ms SKYLEEN BENTLEY is a 38 year old patient who is living with her family. She is needing assistance (independent/assist) with all care needs and has support of family for transportation to medical appointments  Ms Markwell has a 72 month old child  Conditions: unheralded onset of headache and inability to use her right side. CT revealed large left temporal ICH with intraventricular extension. Pt was intubated for airway protection. PT s/p left temporal craniotomy for hematoma evacuation and right frontal ventricular catheter placement, aphasia due to acute stroke, intercranial venous L vein of [place], ICH, HLD, obesity, marijuana use, hypokalemia, obstructive hydrocephalus, cytotoxic brain edema   Medications: concerns with Keppra Need refills to see primary care provider (PCP)  On 09/20/19    Appointments: 09/20/19 primary care provider (PCP)  Advance Directives: Denies need for assist with advance directives   Consent: [facility] RN CM reviewed [facility] services with patient. Patient gave verbal consent for services [facility] telephonic RN CM.   Advised patient that there will be further automated EMMI- post discharge calls to assess how the patient is doing following the recent hospitalization Advised the patient that another call may be received from a nurse if any of their responses were abnormal. Patient voiced  understanding and was appreciative of f/u call.   Plan: [facility] RN CM will follow up with Ms Coonradt within 7-10 business days and pending a return call from her sister, Aldona Bar  Pt encouraged to return a call to [facility] RN CM prn  [facility] RN CM sent a successful outreach letter as discussed with [facility] brochure enclosed for review  Unable to Routed note to MDs/NP/PA- NO MD listed  Joelene Millin L. Lavina Hamman, RN, BSN, [place] Coordinator Office number [phone] Mobile number [phone]  Main THN number [phone] Fax number [phone]

## 2019-09-20 ENCOUNTER — Other Ambulatory Visit: Payer: Self-pay | Admitting: *Deleted

## 2019-09-20 NOTE — Patient Outreach (Signed)
[facility]) Care Management  09/20/2019  Amanda Small September 21, 1981 [id number]   [facility]- stroke D/c from [facility] Day # 9 Date: Friday 09/15/19 1002 patient  Red Alert Reason: Questions/problems with meds? Yes Lost interest in things they used to enjoy?Yes Sad, hopeless, anxious, or empty? Yes  Insurance: united healthcare medicare and medicaid of [place]  [facility] admissions x 1 ED visits x 1 in the last 6 months  Last [facility] at [facility] 08/25/19 to 09/05/19 for ICH (intracerebral hemorrhage) (HCC)-L ICH w/ IVH s/p crani and EVD  Patient's sister Amanda Small returned a call to [facility] Amanda Small CM She is able to verify HIPAA, DOB and Address Reviewed and addressed Amanda Small red alert follow up call to Amanda Small on 09/19/19   Follow up call from Sister to review Amanda Small stroke red alerts for 09/15/19 Amanda Small confirms they are driving during this call to Amanda Small for Amanda Small's primary care MD follow up visit at 1 pm. She confirms the primary care MD as Amanda Small ([facility] [phone]) [facility] Amanda Small CM entered this in [place]. Amanda Small discussed they were not able to recall the MD name during the hospitalization and that Amanda Small may need notes for recent [facility] hospitalization [facility] Amanda Small CM informed [facility] Amanda Small CM would fax via Epic the discharge summary, therapy notes and [facility] Amanda Small CM notes to the listed fax number for Amanda Small. [facility] Amanda Small CM also discussed with Amanda Small that if Amanda Small has access to Epic notes will also be able to be viewed in Care everywhere. Amanda Small voiced understanding Amanda Small confirms Amanda Small has all medications except Keppra and Fioricet plus home health therapies have started. Amanda Small states she hopes Amanda Small will be able to renew the [place] and they want to try not using the Fioricet at this time.  Discussed [facility] Amanda Small CM upcoming follow up call in 7-14 business days Inquired about primary care provider for Amanda Small and  Amanda Small voiced appreciation with local provider assist  Call placed to the primary care office after noted declined fax of records to [phone] Spoke with Amanda Small about faxed information for today's visit Amanda Small confirms the office has access to Epic and Care every where Refaxed to [phone] (offered as the alternate fax number)   Consent: [facility] Amanda Small CM reviewed [facility] services with patient. Patient gave verbal consent for [facility] telephonic Amanda Small CM services.  Plans [facility] CM will follow up with Amanda Robinette for further assessment, education and assist with local primary care provider prn  Pt encouraged to return a call to [facility] Small CM prn  Routed note to MDs/NP/PA  Amanda Millin L. Amanda Hamman, Amanda [facility], BSN, [place] Coordinator Office number [phone] Mobile number [phone]  Main THN number [phone] Fax number [phone]

## 2019-09-29 ENCOUNTER — Other Ambulatory Visit: Payer: Self-pay

## 2019-09-29 ENCOUNTER — Other Ambulatory Visit: Payer: Self-pay | Admitting: *Deleted

## 2019-09-29 NOTE — Patient Outreach (Signed)
  [facility]) Care Management  09/29/2019  [name] [birth year] [id number]   Follow up for EMMI- stroke referred patient D/c from [facility] RED ON EMMI ALERT Day #9 Date:Friday 09/15/19 1002 patient Red Alert Reason:Questions/problems with meds? Yes Lost interest in things they used to enjoy?Yes Sad, hopeless, anxious, or empty? Yes  Insurance:united healthcare medicareand medicaid of [place] [facility] admissions x 1ED visits x 1 in the last 6 months  Last admission at [facility] 08/25/19 to 09/05/19 for ICH (intracerebral hemorrhage) (HCC)-L ICH w/ IVH s/p crani and EVD  [facility] Customer service manager (THN)Registered nurse (RN)  Publishing rights manager (CM) Called to the patient's home number and received a message stating the voice mail box was full, therefore was not able to leave a message [facility] RN CM called to her sister Amanda Small whom Amanda Vessell provided permission for [facility] RN CM to speak with during the initial call.  Amanda Small is able to verify HIPAA, DOB and Address Reviewed follow up call reason to assist with local medical providers  Amanda Small is only able to speak briefly but is still interested in local providers for medical care [facility] RN CM offered to send a list of in network providers to verified address Amanda Small voiced appreciation  [facility] RN CM encouraged a return call from [facility] within the next 7 business days  [facility] RN CM sent a list of in network medicaid providers locally in [place] to the verified address listed in Epic   Social: Amanda Small is a 39 year old patient who is living with her family (mother). She is needing assistance (independent/assist) with all care needs and has support of family for transportation to medical appointments  Amanda Small has a 37 month old son    Conditions: unheralded onset of headache and inability to use her right side. CT revealed large left temporal ICH with intraventricular extension. Pt was intubated for airway  protection. PT s/p left temporal craniotomy for hematoma evacuation and right frontal ventricular catheter placement, aphasia due to acute stroke, intercranial venous L vein of [place], ICH, HLD, obesity, marijuana use, hypokalemia, obstructive hydrocephalus, cytotoxic brain edema  Appointments: 09/20/19 primary care provider (PCP) completed seen by Dr [place] Sees neurology on 10/11/19 at 1516   Advance Directives: Denies need for assist with advance directives   Consent: [facility] RN CM reviewed [facility] services with patient. Patient gave verbal consent for services [facility] telephonic RN CM  Plans [facility] RN CM will follow up with Amanda Small within the next 7-14 days for further assessment, education and assist with local primary care provider prn  Pt encouraged to return a call to [facility] RN CM prn  Routed note to MDs/NP/PA  Cala Bradford L. [name], RN, BSN, CCM [facility] Telephonic Care Management Care Coordinator Office number [phone] Mobile number [phone]  Main THN number [phone] Fax number [phone]

## 2019-10-06 ENCOUNTER — Other Ambulatory Visit: Payer: Self-pay | Admitting: *Deleted

## 2019-10-06 ENCOUNTER — Other Ambulatory Visit: Payer: Self-pay

## 2019-10-06 NOTE — Patient Outreach (Signed)
[facility]) Care Management  10/06/2019  Amanda Small Jan 22, 1981 [id number]  EMMI- stroke- Not on APL D/c from [facility] RED ON EMMI ALERT Day # 9 Date: Friday 09/15/19 1002 patient  Red Alert Reason: Questions/problems with meds? Yes Lost interest in things they used to enjoy?Yes Sad, hopeless, anxious, or empty? Yes  Insurance:united healthcare medicareand medicaid of [place] [facility] admissions x 1ED visits x 1 in the last 6 months  [facility] at [facility] 08/25/19 to 09/05/19 for ICH (intracerebral hemorrhage) (HCC)-L ICH w/ IVH s/p crani and EVD  Outreach attempt # 3 successful at the home number Patient is able to verify HIPAA, DOB and address with increased time related to her aphasia  Amanda Small is able to state DOB but needed some encouragement with the address She initially stated [place] versus [place] and had difficulty with the house number and zip code  Progression of health status Amanda Small reports she is doing good and states her only medical concern is need to improve her speech. She informs [facility] RN CM she continues to work with her Home health [facility]) speech therapist. She reports she is completing  Activities of daily living (ADLs) and some  IADLs (instrumental Activities of Daily Living) independently. She states she cared for her infant son "last night by myself" without any issues. She informs CM I know what I want to say but ?'my mouth. Getting it out of my mouth is the problem"/ She reports she spoke with her employer recently and is not comfortable with returning to work if her immediate supervisor will not be there. She will have further discussions with her employer. Encouragement provided   Education provided on her recent medical diagnosis  left temporal ICH with intraventricular extension.and s/p left temporal craniotomy for hematoma evacuation discussed  She voiced understanding  She reports she has been reading her after summary  information on hemorrhagic stroke  [facility] RN CM sent her further EMMI on aphasia, intraacerebral hemorrhage and what you can do to prevent a second stroke  List of local in network medical providers Amanda Small confirms she received the list [facility] RN CM today  She spoke with her sister, [name] about the list and they are to review the list the weekend. [facility] RN CM encouraged her or her sister to return a call to [facility] RN CM if assistance is needed  Upcoming neurology appointment Reviewed with her the upcoming Neurology appointment on 10/11/19 She confirms her sister will assist her with transportation to the appointment  Social: Amanda Small is a 39 year old patient who is living with her family (mother). She is needing assistance(independent/assist)with all care needs and has support of family for transportation to medical appointments  Amanda Small has a 75 month old son    Conditions:unheralded onset of headache and inability to use her right side. CT revealed large left temporal ICH with intraventricular extension. Pt was intubated for airway protection. PT s/p left temporal craniotomy for hematoma evacuation and right frontal ventricular catheter placement,aphasia due to acute stroke, intercranial venous L vein of [place], ICH, HLD, obesity, marijuana use, hypokalemia, obstructive hydrocephalus, cytotoxic brain edema  Appointments:12/30/20primary care provider (PCP) completed seen by Dr [place] Sees neurology on 10/11/19 at 1515   Advance Directives:Denies need for assist with advance directives  Consent: [facility] RN CM reviewed [facility] services with patient. Patient gave verbal consent for services [facility] telephonic RN CM  [facility] RN CM will follow up with  Amanda Small within the next 14-21 days for further assessment, and plan for case closure as patient is not on APL list  This was discussed with her on 10/06/19 and she voiced understanding   Pt encouraged to return a call to  [facility] RN CM prn  Routed note to MDs/NP/PA   Joelene Millin L. Lavina Hamman, RN, BSN, [place] Coordinator Office number [phone] Mobile number [phone]  Main THN number [phone] Fax number [phone]

## 2019-10-11 ENCOUNTER — Ambulatory Visit (INDEPENDENT_AMBULATORY_CARE_PROVIDER_SITE_OTHER): Payer: 59 | Admitting: Adult Health

## 2019-10-11 ENCOUNTER — Other Ambulatory Visit: Payer: Self-pay

## 2019-10-11 NOTE — Progress Notes (Signed)
Entered in error

## 2019-10-16 ENCOUNTER — Telehealth: Payer: Self-pay | Admitting: Neurology

## 2019-10-16 ENCOUNTER — Telehealth: Payer: Self-pay

## 2019-10-16 NOTE — Telephone Encounter (Signed)
The patient's driver called back and spoke with Jael in the phone room.  She was very nice and appropriate over the phone.  The patient was in some discomfort at the incision site of her craniotomy.  The phone room operator was able to direct her to call her neurosurgeon.  The driver also expressed some cognitive concerns regarding the patient to explain her outburst.  The patient's visit was rescheduled for an office visit.

## 2019-10-16 NOTE — Telephone Encounter (Signed)
Patient called attempting to get information about her Mychart appointment at 9:00 am. Patient was advised her appointment was a Mychart appointment and Mychart was active and she needed to login in order to go through check in process and also provided Mychart number for technical assistance. Patient stated she does not remember activating Mychart or any information related to Mychart. Advised patient that we can deactivate her account and send new link in order to get her prepared for her appointment. Patient stated she called mychart number and no one was able to help her and we ([facility]) aren't doing our jobs and we aren't helping her in order for her to get seen. Attempted to advise patient we can set her up for mychart if she would like and patient refused. Patient was advised that if she is unable to do mychart we will need to reschedule. Patient stated she will be expecting at call at 9:00am for her appointment and states that if she doesn't receive a call she will "act a fool" and come to the clinic. She states that if I did not understand her it "will not be pretty" and the only way we will be able to "get rid of her" is to call the police on her. Patient was cursing throughout the call and stated that she will not tolerate this and that where she is from she doesn't take it and she will "show out" just how she does where she is from.  Advised RN patient reply to the messages and advised to send to Leadership team for review

## 2019-10-19 ENCOUNTER — Telehealth: Payer: Self-pay

## 2019-10-19 NOTE — Telephone Encounter (Signed)
I spoke with Dr. Pearlean Brownie about pts sister wanting FMLA leave form to start 07/27/2020 to 10/23/2019. The note stated they wanted leave for post part num depression. PEr Dr.SEthi he can do the form for stroke only and the continuous leave will start 08/25/2019 until 10/31/2019. HE cannot do form for the month of November. Pt was seen by another MD for another medical condition not related to stroke. Pts sister [name] will be call about Dr Pearlean Brownie recommendations.

## 2019-10-19 NOTE — Telephone Encounter (Signed)
LEft two vm for [name] pts sister that leave form will start 08/25/2019 to appt on 10/30/2018. ALso vm left that the form will be done for stroke only. LEft contact number to call back with any questions.

## 2019-10-20 ENCOUNTER — Other Ambulatory Visit: Payer: Self-pay | Admitting: *Deleted

## 2019-10-20 NOTE — Patient Outreach (Signed)
[facility]) Care Management  10/20/2019  Amanda Small [birth year] [id number]  [facility] outreach attempt for case closure for EMMI referred patient (not on APL)    Outreach attempt to [name] not successful  She answered her phone. There was noted talking in the background (another female speaking and a baby crying). [facility] RN CM greeted her and informed her [facility] RN CM was following up with her. [facility] RN CM asked if it was a good time to speak. [facility] RN CM heard further communication not directed to [facility] RN CM to include " It is from my doctor's office". Then Ms Even states she wants to have her sister with her. [facility] RN CM voiced understanding, again informed her [facility] RN CM was from [facility] and not her MD office to clarify. [facility] RN CM encouraged a return to [facility] RN CM to [facility] RN CM office number that was indicated on her phone. [facility] RN CM provided the [facility] RN  CM business hours. She voiced understanding, appreciation and concluded the call   Last [facility] RN CM outreach was on 10/06/19  Since that time it is noted in EPIC notes that [name]'s 10/11/19 virtual Neurology appointment has been rescheduled to 10/31/19 (office visit) after technology concerns with mychart and there was a 10/16/19 [facility] visit for discomfort at craniotomy incision site She was treated and referred back to her neurosurgeon/neurology office   Pt also noted requesting assistance for FMLA  from 07/27/20 to 10/23/19 from Neurology office   Listed upcoming appointments 10/31/19 Neurology office visit Dr Pearlean Brownie  Plan: [facility] RN CM scheduled this patient for one final call attempt within 4 business days  Pt encouraged to return a call to [facility] RN CM prn   [name] L. [name], RN, BSN, CCM [facility] Telephonic Care Management Care Coordinator Office number [phone] Mobile number [phone]  Main [facility] number [phone] Fax number [phone]

## 2019-10-20 NOTE — Patient Outreach (Signed)
[facility]) Care Management  10/20/2019  Amanda Small [birth year] [id number]   [facility] outreach for EMMI referred patient (not on APL) case closure  Patient's sister Amanda Small returned a call to [facility] RN CM Wynelle Link is able to verify HIPAA, DOB and Address  Consent: [facility] RN CM reviewed [facility] services with patient. Patient gave verbal consent for [facility] telephonic RN CM services.   Amanda Small confirms Amanda Small has a rescheduled neurology visit for 10/31/19  Amanda Small confirms follow up with primary care provider (PCP) for craniotomy incision site She reports that site is doing better and the MD gave her cream to apply to the area. She discussed the area remains tender and a CT as completed   Amanda Small reports the only present concern is FMLA (Family and Medical Leave Act) for Amanda Small. The FMLA papers were submitted to Neurology on 10/11/19, FMLA was denied [facility] RN CM discussed that Dr Marlis Edelson portion of the FMLA forms will start from the admission date of 08/25/19 to the neurology appointment date of 10/31/19 related only to the stroke diagnosis and any  uncompleted portion needs to be submitted to th emedical  provider seen prior to Dr Pearlean Brownie. [facility] RN CM provided encouragement related to the FMLA (Family and Medical Leave Act) process   Plans case closure for [facility] EMMI (not on APL) referral (received on 09/18/19) [facility] RN CM will close case at this time as patient has been assessed and no care coordination needs identified/needs resolved.   Pt encouraged to return a call to [facility] RN CM prn  Routed note to MDs/NP/PA   Cala Bradford L. [name], RN, BSN, CCM [facility] Telephonic Care Management Care Coordinator Office number [phone] Mobile number [phone]  Main [facility] number [phone] Fax number [phone]

## 2019-10-23 ENCOUNTER — Ambulatory Visit: Payer: Self-pay | Admitting: *Deleted

## 2019-10-23 NOTE — Telephone Encounter (Signed)
FYI-Patient sister is faxing over new FMLA paperwork

## 2019-10-23 NOTE — Telephone Encounter (Signed)
FMLA form done given to medical records. Fee waive of 50 dollars.

## 2019-10-24 ENCOUNTER — Telehealth: Payer: Self-pay | Admitting: *Deleted

## 2019-10-24 NOTE — Telephone Encounter (Signed)
I faxed pt form to [employer] on 10/24/19 to [phone]

## 2019-10-31 ENCOUNTER — Other Ambulatory Visit: Payer: Self-pay

## 2019-10-31 ENCOUNTER — Ambulatory Visit (INDEPENDENT_AMBULATORY_CARE_PROVIDER_SITE_OTHER): Payer: 59 | Admitting: Neurology

## 2019-10-31 ENCOUNTER — Encounter: Payer: Self-pay | Admitting: Neurology

## 2019-10-31 VITALS — BP 106/69 | HR 79 | Temp 96.0°F | Ht 69.0 in | Wt 174.6 lb

## 2019-10-31 DIAGNOSIS — G08 Intracranial and intraspinal phlebitis and thrombophlebitis: Secondary | ICD-10-CM | POA: Diagnosis not present

## 2019-10-31 DIAGNOSIS — I69320 Aphasia following cerebral infarction: Secondary | ICD-10-CM

## 2019-10-31 DIAGNOSIS — G3184 Mild cognitive impairment, so stated: Secondary | ICD-10-CM

## 2019-10-31 DIAGNOSIS — I611 Nontraumatic intracerebral hemorrhage in hemisphere, cortical: Secondary | ICD-10-CM

## 2019-10-31 MED ORDER — ASPIRIN EC 81 MG PO TBEC: 81.0000 mg | DELAYED_RELEASE_TABLET | Freq: Every day | ORAL | 2 refills | Status: AC

## 2019-10-31 NOTE — Patient Instructions (Addendum)
I had a long discussion with the patient and her sister regarding her residual language and cognitive deficits following her recent intracerebral hemorrhage etiology of which is still indeterminate.  I recommend further evaluation by checking MRI scan of the brain with and without contrast and MR venogram.  Refer for outpatient speech therapy for language and cognition.  We also discussed memory compensation strategies.  Start aspirin 81 mg daily.  She will return for follow-up in 3 months with my nurse practitioner Shanda Bumps or call earlier if necessary  Memory Compensation Strategies  1. Use "WARM" strategy.  W= write it down  A= associate it  R= repeat it  M= make a mental note  2.   You can keep a Glass blower/designer.  Use a 3-ring notebook with sections for the following: calendar, important names and phone numbers,  medications, doctors' names/phone numbers, lists/reminders, and a section to journal what you did  each day.   3.    Use a calendar to write appointments down.  4.    Write yourself a schedule for the day.  This can be placed on the calendar or in a separate section of the Memory Notebook.  Keeping a  regular schedule can help memory.  5.    Use medication organizer with sections for each day or morning/evening pills.  You may need help loading it  6.    Keep a basket, or pegboard by the door.  Place items that you need to take out with you in the basket or on the pegboard.  You may also want to  include a message board for reminders.  7.    Use sticky notes.  Place sticky notes with reminders in a place where the task is performed.  For example: " turn off the  stove" placed by the stove, "lock the door" placed on the door at eye level, " take your medications" on  the bathroom mirror or by the place where you normally take your medications.  8.    Use alarms/timers.  Use while cooking to remind yourself to check on food or as a reminder to take your medicine, or as a   reminder to make a call, or as a reminder to perform another task, etc.

## 2019-10-31 NOTE — Progress Notes (Signed)
[facility] [address] [place]. [place] [address] [phone]       OFFICE FOLLOW-UP NOTE  [name] Date of Birth:  07-28-1981 Medical Record Number:  [id number]   HPI: [name] is a pleasant 39 year old African-American lady seen today for initial office follow-up visit following hospital admission for intracerebral hemorrhage in December 2020.  She is accompanied by sister.  History is obtained from them and review of electronic medical records and I personally reviewed imaging films in PACS.  She has no significant past medical history who presented to [facility] after last known normal being on 11 PM on 08/24/2019 when she went to bed.  She woke up the next morning with a headache and neck pain.  She had delivered a child in August 2020 and was still breast-feeding.  She presented with right-sided weakness, and confusion.  CT scan of the head done in the ER showed large left temporal intracerebral hemorrhage with extension into the ventricles.  She had a Glasgow Coma Scale of 12 and was initially able to speak but had slurred speech and right-sided weakness.  She was intubated for airway protection and stat neurosurgical consultation was obtained.  Her blood pressure was tightly controlled.  She was taken for emergent craniotomy for evacuation of the clot.  She also had ventriculostomy placed to drain her ventricles.  She had a prolonged stay in the ICU but did well and made gradual improvement.  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  This was subsequently confirmed on diagnostic cerebral catheter angiogram on 08/31/2019 by Dr. Conchita Paris who noticed a relatively large cortical vein over the left frontoparietal convexity emptying into the midportion of the superior sagittal sinus with paucity of ramifying veins emptying into this large cortical vein  suggesting likelihood of cortical venous thrombosis involving vein of Trolard.  The large cerebral venous sinuses are patent 2D echo showed normal ejection fraction.  Covid test was negative.  LDL was 124 mg percent.  Hemoglobin A1c was 4.7.  Urine drug screen was positive for marijuana.  Hypercoagulable panel labs were all negative.  Vasculitic labs were also negative patient obtained significant improvement initially she was felt to be candidate for inpatient rehab but she improved rapidly and was discharged home with requirement for 24/7 supervision.  She states she is continued to do well.  Physically she is able to ambulate independently without assistance but she is still has significant memory and cognitive difficulties.  Speech is improved but she still had word hesitancy and occasional paraphasias.  She cannot multitask and has poor short-term memory.  She gets frustrated and agitated easily.  She recently saw Dr. Mikal Plane who discontinued Keppra which was given only for seizure prophylaxis for surgery and she did not have a clear documented seizure.  She is able to take care of her young child and attend to his needs.  She is living with her mother and asked somebody around her 24 hours.  She was kept in the ICU and blood pressure tightly controlled and monitored.  She was extubated.  She made gradual improvement  ROS:   14 system review of systems is positive for memory difficulty, trouble speaking, difficulty with multitasking fatigue, tiredness and all other systems negative  PMH: No past medical history on file.  Social History:  Social History   Socioeconomic History  . Marital status: Single  Spouse name: Not on file  . Number of children: Not on file  . Years of education: Not on file  . Highest education level: Not on file  Occupational History  . Not on file  Tobacco Use  . Smoking status: Never Smoker  . Smokeless tobacco: Never Used  Substance and Sexual Activity  . Alcohol  use: Not on file  . Drug use: Not on file  . Sexual activity: Not on file  Other Topics Concern  . Not on file  Social History Narrative  . Not on file   Social Determinants of Health   Financial Resource Strain:   . Difficulty of Paying Living Expenses: Not on file  Food Insecurity:   . Worried About Programme researcher, broadcasting/film/video in the Last Year: Not on file  . Ran Out of Food in the Last Year: Not on file  Transportation Needs: No Transportation Needs  . Lack of Transportation (Medical): No  . Lack of Transportation (Non-Medical): No  Physical Activity:   . Days of Exercise per Week: Not on file  . Minutes of Exercise per Session: Not on file  Stress:   . Feeling of Stress : Not on file  Social Connections:   . Frequency of Communication with Friends and Family: Not on file  . Frequency of Social Gatherings with Friends and Family: Not on file  . Attends Religious Services: Not on file  . Active Member of Clubs or Organizations: Not on file  . Attends Banker Meetings: Not on file  . Marital Status: Not on file  Intimate Partner Violence:   . Fear of Current or Ex-Partner: Not on file  . Emotionally Abused: Not on file  . Physically Abused: Not on file  . Sexually Abused: Not on file    Medications:   No current outpatient medications on file prior to visit.   No current facility-administered medications on file prior to visit.    Allergies:   Allergies  Allergen Reactions  . Penicillins Itching    Physical Exam General: well developed, well nourished middle-aged African-American lady, seated, in no evident distress Head: head normocephalic and atraumatic.  Neck: supple with no carotid or supraclavicular bruits Cardiovascular: regular rate and rhythm, no murmurs Musculoskeletal: no deformity.  Scar left temple from craniotomy. Skin:  no rash/petichiae Vascular:  Normal pulses all extremities Vitals:   10/31/19 1332  BP: 106/69  Pulse: 79  Temp: (!)  96 F (35.6 C)   Neurologic Exam Mental Status: Awake and fully alert. Oriented to place and time. Recent and remote memory poor attention span, concentration and fund of knowledge diminished. Mood and affect appropriate.  Mild nonfluent speech with occasional word finding difficulties.  Diminished attention, registration and recall.  Recall is 1/3.  Able to name only 6 animals which can walk on 4 legs. Cranial Nerves: Fundoscopic exam reveals sharp disc margins. Pupils equal, briskly reactive to light. Extraocular movements full without nystagmus. Visual fields full to confrontation. Hearing intact. Facial sensation intact. Face, tongue, palate moves normally and symmetrically.  Motor: Normal bulk and tone. Normal strength in all tested extremity muscles. Sensory.: intact to touch ,pinprick .position and vibratory sensation.  Coordination: Rapid alternating movements normal in all extremities. Finger-to-nose and heel-to-shin performed accurately bilaterally. Gait and Station: Arises from chair without difficulty. Stance is normal. Gait demonstrates normal stride length and balance . Able to heel, toe and tandem walk without difficulty.  Reflexes: 1+ and symmetric. Toes downgoing.   NIHSS  3 Modified Rankin  2   ASSESSMENT: 39 year old African-American lady with left and pleural parenchymal intracerebral hemorrhage as well as intraventricular hemorrhage in December 2020 requiring craniotomy as well as ventriculostomy of an clear etiology.  Possibly vein of Trolard thrombosis but it occurred nearly 3 months postpartum making association with pregnancy doubtful.  Lab work for vasculitis and hypercoagulable panel were negative     PLAN: I had a long discussion with the patient and her sister regarding her residual language and cognitive deficits following her recent intracerebral hemorrhage etiology of which is still indeterminate.  I recommend further evaluation by checking MRI scan of the brain  with and without contrast and MR venogram.  Refer for outpatient speech therapy for language and cognition.  We also discussed memory compensation strategies.  Start aspirin 81 mg daily.  She will return for follow-up in 3 months with my nurse practitioner Janett Billow or call earlier if necessary.Greater than 50% of time during this 35 minute visit was spent on counseling,explanation of diagnosis of intracerebral hemorrhage, planning of further management, discussion with patient and family and coordination of care Antony Contras, MD  [facility] [address] [place], [place] [address]  Phone [phone] Fax [phone] Note: This document was prepared with digital dictation and possible smart phrase technology. Any transcriptional errors that result from this process are unintentional

## 2019-11-14 ENCOUNTER — Telehealth: Payer: Self-pay | Admitting: Neurology

## 2019-11-14 NOTE — Telephone Encounter (Signed)
[employer] pending faxed notes/medicaid. °

## 2019-11-15 ENCOUNTER — Telehealth: Payer: Self-pay | Admitting: Neurology

## 2019-11-15 NOTE — Telephone Encounter (Signed)
Amanda Small returned call. She states she will be available for 15 min then she has her next pt.

## 2019-11-15 NOTE — Telephone Encounter (Signed)
I called Deidre that outpatient therapy referral was sent 10/31/2019 to neuro rehab next door. I stated their office is suppose to call pt to schedule therapy.I stated pt can call or walk in to schedule appts. Deidre verbalized understanding.

## 2019-11-15 NOTE — Telephone Encounter (Signed)
deidra with [facility] called in regards to an outpatient referral states it is time sensitive   Please follow up

## 2019-11-15 NOTE — Telephone Encounter (Signed)
LEft vm for Deidre to call back about therapy.

## 2019-11-16 NOTE — Telephone Encounter (Signed)
I called to check the status it is still pendign they did receive my fax.

## 2019-11-20 ENCOUNTER — Other Ambulatory Visit: Payer: Self-pay

## 2019-11-20 ENCOUNTER — Ambulatory Visit: Payer: 59 | Attending: Neurology | Admitting: Speech Pathology

## 2019-11-20 DIAGNOSIS — R4701 Aphasia: Secondary | ICD-10-CM

## 2019-11-20 NOTE — Therapy (Signed)
[facility] [address] [place], [place], [address] Phone: [phone]   Fax:  [phone]  Speech Language Pathology Evaluation  Patient Details  Name: Amanda Small MRN: [id number] Date of Birth: Jan 30, 1981 Referring Provider (SLP): Dr. Delia Heady   Encounter Date: 11/20/2019    No past medical history on file.  Past Surgical History:  Procedure Laterality Date  . CRANIOTOMY Left 08/25/2019   Procedure: LEFT CRANIECTOMY;  Surgeon: Coletta Memos, MD;  Location: [facility] OR;  Service: Neurosurgery;  Laterality: Left;  . IR ANGIO INTRA EXTRACRAN SEL INTERNAL CAROTID BILAT MOD SED  08/31/2019  . IR ANGIO VERTEBRAL SEL VERTEBRAL UNI L MOD SED  08/31/2019  . VENTRICULOSTOMY Right 08/25/2019   Procedure: RIGHT FRONTAL VENTRICULAR CATHETER;  Surgeon: Coletta Memos, MD;  Location: [facility] OR;  Service: Neurosurgery;  Laterality: Right;    There were no vitals filed for this visit.      SLP Evaluation [facility] - 11/20/19 1101      SLP Visit Information   SLP Received On  11/20/19    Referring Provider (SLP)  Dr. Delia Heady    Onset Date  08/25/19    Medical Diagnosis  left ICH      Subjective   Patient/Family Stated Goal  "To make sure that I can focus the right way so I can go to work"      General Information   HPI  ASSESSMENT: 39 year old African-American lady with left and pleural parenchymal intracerebral hemorrhage as well as intraventricular hemorrhage in December 2020 requiring craniotomy as well as ventriculostomy of an clear etiology.  Possibly vein of Trolard thrombosis but it occurred nearly 3 months postpartum making association with pregnancy doubtful.  Lab work for vasculitis and hypercoagulable panel were negative    Mobility Status  walks independently      Balance Screen   Has the patient fallen in the past 6 months  No    Has the patient had a decrease in activity level because of a fear of falling?   No     Is the patient reluctant to leave their home because of a fear of falling?   No      Prior Functional Status   Cognitive/Linguistic Baseline  Within functional limits    Type of Home  Apartment     Lives With  --   mom,  her baby   Vocation  Other (Comment)   not working trying to get disability     Cognition   Overall Cognitive Status  Impaired/Different from baseline    Area of Impairment  Attention;Following commands      Auditory Comprehension   Overall Auditory Comprehension  Impaired    Yes/No Questions  Impaired    Complex Questions  50-74% accurate    Commands  Impaired    Multistep Basic Commands  50-74% accurate    Complex Commands  50-74% accurate    Conversation  Simple    Interfering Components  Anxiety;Processing speed    EffectiveTechniques  Extra processing time;Slowed speech;Repetition;Pausing;Visual/Gestural cues      Visual Recognition/Discrimination   Discrimination  Not tested      Verbal Expression   Overall Verbal Expression  Impaired    Initiation  No impairment    Automatic Speech  --   WFL   Level of Generative/Spontaneous Verbalization  Conversation    Repetition  Impaired    Level of Impairment  Sentence level    Naming  Impairment  Responsive  76-100% accurate    Confrontation  75-100% accurate    Convergent  Not tested    Divergent  Not tested    Verbal Errors  Phonemic paraphasias;Aware of errors    Pragmatics  No impairment    Effective Techniques  Open ended questions      Written Expression   Dominant Hand  Right    Written Expression  Exceptions to [facility]    Dictation Ability  Phrase      Oral Motor/Sensory Function   Overall Oral Motor/Sensory Function  Appears within functional limits for tasks assessed      Motor Speech   Overall Motor Speech  Appears within functional limits for tasks assessed      Standardized Assessments   Standardized Assessments   --   Quick Aphasia Battery - 6.36. Moderate impairment                      SLP Education - 11/20/19 1240    Education Details  areas of impairments, goals for ST, insurance limits       SLP Short Term Goals - 11/20/19 1257      SLP SHORT TERM GOAL #1   Title  Pt will utilize compensatory strategies to read/comprehend 2 mildly complex sentence messges with occasional min A.    Baseline  1 simple sentence    Time  4    Period  Weeks    Status  New      SLP SHORT TERM GOAL #2   Title  Pt will ID and correct errors in simple written sentence with occasional min A over 2 sessions    Baseline  Not able to correct written errors    Time  4    Period  Weeks    Status  New      SLP SHORT TERM GOAL #3   Title  Pt will utilize compensatory strategies to comprehend mildly complex verbal questions/commands with occasional min A    Baseline  no strategies    Time  4    Period  Weeks    Status  New       SLP Long Term Goals - 11/20/19 1304      SLP LONG TERM GOAL #1   Title  Pt will comprehend 4-5 senteces texts with occasional min A over 2 sessoins    Baseline  1 simple sentence    Time  8    Period  Weeks    Status  New      SLP LONG TERM GOAL #2   Title  Pt will text simple 2-3 sentece text, correcting errors with occasional min A over 2 sessions    Baseline  not correcting written errors    Time  8    Period  Weeks    Status  New      SLP LONG TERM GOAL #3   Title  Pt will utilize speech to text or other compensations to comprehend 2 paragraph emails with occasional min A over 2 sessions    Baseline  no compensations for email comprehension    Time  8    Period  Weeks    Status  New      SLP LONG TERM GOAL #4   Title  Pt will utilize compensations for aphasia to successfully participate in 8 minute business call (MD, insurance, pharmacy, work, etc) with occasional min A over 2 sessions    Baseline  no  successful strategies for phone converations    Time  8    Period  Weeks    Status  New       Plan -  11/20/19 1240    Clinical Impression Statement  Amanda Small is referred for CVA due to ongoing cognitive and language impairments s/p ICH (I61.9) on 08/25/19, She was hospitalized 08/25/19 to 09/05/19. Today she presents with moderate apahsia (R47.01) charactized by word finding impairments at conversation level, auditory comprehension impairment at simple sentence level, Reading comprehension impairment at sentence level, written expression impaired at simple sentence level. Amanda Small has good awareenss of her impairments and reports trying to write down what she wants to say prior to a phone call. She reports she is not successful with this as she "gets confused" on the phone. She is aware of her verbal errors and uses releated words and descriptions to compensate. She states "I can do everything, but I can't tell anybody about it." Amanda Small indicates that reading comprehension impairment is the most significant. She showed me a text from her baby's father, asking about the baby's night time routine. Amanda Small was not able to comprehend the text, therefore didn't respond with important information the father needed to care for the baby. Amanda Small has a 30 month old baby and has moved in with her mother after her CVA. She states her realtionship with her mother is strained and her communication impairments cause tension with her mother. Prior to CVA, Amanda Small was working full time, living independently and was independent with all IADL's. At this time, the aphasia is making it difficulty for Amanda Small to communicate with social services re: disability insurance, difficutly communicating with her family. She is not using strategies for reading or auditory comprehension. I recommend skilled ST to maximize communication for independence, safety of her and her baby and comprehension of medical and insurance information.    Speech Therapy Frequency  3x / week    Duration  --   8 weeks or 17 visits   Treatment/Interventions   Language facilitation;Environmental controls;Cueing hierarchy;SLP instruction and feedback;Compensatory strategies;Functional tasks;Cognitive reorganization;Compensatory techniques;Internal/external aids;Multimodal communcation approach;Patient/family education       Patient will benefit from skilled therapeutic intervention in order to improve the following deficits and impairments:   Aphasia    Problem List Patient Active Problem List   Diagnosis Date Noted  . Intracranial venous  - L vein of [place]ard 09/05/2019  . Hyperlipemia 09/05/2019  . Obesity 09/05/2019  . Marijuana use 09/05/2019  . Hypokalemia 09/05/2019  . Cytotoxic brain edema ([place]) 08/28/2019  . Obstructive hydrocephalus ([place]) 08/28/2019  . Aphasia due to acute stroke ([place]) 08/28/2019  . ICH (intracerebral hemorrhage) (HCC)-L ICH w/ IVH s/p crani 08/25/2019    Amanda Small, Annye Rusk MS, CCC-SLP 11/20/2019, 1:12 PM  [facility] [address] [place], [place], [address] Phone: [phone]   Fax:  [phone]  Name: Amanda Small MRN: [id number] Date of Birth: 1981/01/31

## 2019-11-20 NOTE — Therapy (Deleted)
[name] [address] [place], [place], [address] Phone: [phone]   Fax:  [phone]  Speech Language Pathology Treatment  Patient Details  Name: Amanda Small MRN: [id number] Date of Birth: 12-07-1980 Referring Provider (SLP): Dr. Antony Contras   Encounter Date: 11/20/2019    No past medical history on file.  Past Surgical History:  Procedure Laterality Date  . CRANIOTOMY Left 08/25/2019   Procedure: LEFT CRANIECTOMY;  Surgeon: [name], MD;  Location: [facility];  Service: Neurosurgery;  Laterality: Left;  . IR ANGIO INTRA EXTRACRAN SEL INTERNAL CAROTID BILAT MOD SED  08/31/2019  . IR ANGIO VERTEBRAL SEL VERTEBRAL UNI L MOD SED  08/31/2019  . VENTRICULOSTOMY Right 08/25/2019   Procedure: RIGHT FRONTAL VENTRICULAR CATHETER;  Surgeon: [name], MD;  Location: [facility] [facility];  Service: Neurosurgery;  Laterality: Right;    There were no vitals filed for this visit.     SLP Evaluation [facility] - 11/20/19 1101      SLP Visit Information   SLP Received On  11/20/19    Referring Provider (SLP)  Dr. Antony Contras    Onset Date  08/25/19    Medical Diagnosis  left ICH      Subjective   Patient/Family Stated Goal  "To make sure that I can focus the right way so I can go to work"      General Information   HPI  ASSESSMENT: 39 year old African-American lady with left and pleural parenchymal intracerebral hemorrhage as well as intraventricular hemorrhage in December 2020 requiring craniotomy as well as ventriculostomy of an clear etiology.  Possibly vein of Trolard thrombosis but it occurred nearly 3 months postpartum making association with pregnancy doubtful.  Lab work for vasculitis and hypercoagulable panel were negative    Mobility Status  walks independently      Balance Screen   Has the patient fallen in the past 6 months  No    Has the patient had a decrease in activity level because of a fear of falling?   No     Is the patient reluctant to leave their home because of a fear of falling?   No      Prior Functional Status   Cognitive/Linguistic Baseline  Within functional limits    Type of Home  Apartment     Lives With  --   mom,  her baby   Vocation  Other (Comment)   not working trying to get disability     Cognition   Overall Cognitive Status  Impaired/Different from baseline    Area of Impairment  Attention;Following commands      Auditory Comprehension   Overall Auditory Comprehension  Impaired    Yes/No Questions  Impaired    Complex Questions  50-74% accurate    Commands  Impaired    Multistep Basic Commands  50-74% accurate    Complex Commands  50-74% accurate    Conversation  Simple    Interfering Components  Anxiety;Processing speed    EffectiveTechniques  Extra processing time;Slowed speech;Repetition;Pausing;Visual/Gestural cues      Visual Recognition/Discrimination   Discrimination  Not tested      Verbal Expression   Overall Verbal Expression  Impaired    Initiation  No impairment    Automatic Speech  --   WFL   Level of Generative/Spontaneous Verbalization  Conversation    Repetition  Impaired    Level of Impairment  Sentence level    Naming  Impairment  Responsive  76-100% accurate    Confrontation  75-100% accurate    Convergent  Not tested    Divergent  Not tested    Verbal Errors  Phonemic paraphasias;Aware of errors    Pragmatics  No impairment    Effective Techniques  Open ended questions      Written Expression   Dominant Hand  Right    Written Expression  Exceptions to [facility]    Dictation Ability  Phrase      Oral Motor/Sensory Function   Overall Oral Motor/Sensory Function  Appears within functional limits for tasks assessed      Motor Speech   Overall Motor Speech  Appears within functional limits for tasks assessed      Standardized Assessments   Standardized Assessments   --   Quick Aphasia Battery - 6.36. Moderate impairment           SLP Education - 11/20/19 1240    Education Details  areas of impairments, goals for ST, insurance limits       SLP Short Term Goals - 11/20/19 1257      SLP SHORT TERM GOAL #1   Title  Pt will utilize compensatory strategies to read/comprehend 2 mildly complex sentence messges with occasional min A.    Baseline  1 simple sentence    Time  4    Period  Weeks    Status  New      SLP SHORT TERM GOAL #2   Title  Pt will ID and correct errors in simple written sentence with occasional min A over 2 sessions    Baseline  Not able to correct written errors    Time  4    Period  Weeks    Status  New      SLP SHORT TERM GOAL #3   Title  Pt will utilize compensatory strategies to comprehend mildly complex verbal questions/commands with occasional min A    Baseline  no strategies    Time  4    Period  Weeks    Status  New       SLP Long Term Goals - 11/20/19 1304      SLP LONG TERM GOAL #1   Title  Pt will comprehend 4-5 senteces texts with occasional min A over 2 sessoins    Baseline  1 simple sentence    Time  8    Period  Weeks    Status  New      SLP LONG TERM GOAL #2   Title  Pt will text simple 2-3 sentece text, correcting errors with occasional min A over 2 sessions    Baseline  not correcting written errors    Time  8    Period  Weeks    Status  New      SLP LONG TERM GOAL #3   Title  Pt will utilize speech to text or other compensations to comprehend 2 paragraph emails with occasional min A over 2 sessions    Baseline  no compensations for email comprehension    Time  8    Period  Weeks    Status  New      SLP LONG TERM GOAL #4   Title  Pt will utilize compensations for aphasia to successfully participate in 8 minute business call (MD, insurance, pharmacy, work, etc) with occasional min A over 2 sessions    Baseline  no successful strategies for phone converations    Time  8  Period  Weeks    Status  New       Plan - 11/20/19 1240     Clinical Impression Statement  Amanda Small is referred for CVA due to ongoing cognitive and language impairments s/p ICH (I61.9) on 08/25/19, She was hospitalized 08/25/19 to 09/05/19. Today she presents with moderate apahsia (R47.01) charactized by word finding impairments at conversation level, auditory comprehension impairment at simple sentence level, Reading comprehension impairment at sentence level, written expression impaired at simple sentence level. Amanda Small has good awareenss of her impairments and reports trying to write down what she wants to say prior to a phone call. She reports she is not successful with this as she "gets confused" on the phone. She is aware of her verbal errors and uses releated words and descriptions to compensate. She states "I can do everything, but I can't tell anybody about it." Amanda Small indicates that reading comprehension impairment is the most significant. She showed me a text from her baby's father, asking about the baby's night time routine. Amanda Small was not able to comprehend the text, therefore didn't respond with important information the father needed to care for the baby. Amanda Small has a 73 month old baby and has moved in with her mother after her CVA. She states her realtionship with her mother is strained and her communication impairments cause tension with her mother. Prior to CVA, Amanda Small was working full time, living independently and was independent with all IADL's. At this time, the aphasia is making it difficulty for Amanda Small to communicate with social services re: disability insurance, difficutly communicating with her family. She is not using strategies for reading or auditory comprehension. I recommend skilled ST to maximize communication for independence, safety of her and her baby and comprehension of medical and insurance information.    Speech Therapy Frequency  3x / week    Duration  --   8 weeks or 17 visits   Treatment/Interventions  Language  facilitation;Environmental controls;Cueing hierarchy;SLP instruction and feedback;Compensatory strategies;Functional tasks;Cognitive reorganization;Compensatory techniques;Internal/external aids;Multimodal communcation approach;Patient/family education       Patient will benefit from skilled therapeutic intervention in order to improve the following deficits and impairments:   Aphasia    Problem List Patient Active Problem List   Diagnosis Date Noted  . Intracranial venous  - L vein of [place]ard 09/05/2019  . Hyperlipemia 09/05/2019  . Obesity 09/05/2019  . Marijuana use 09/05/2019  . Hypokalemia 09/05/2019  . 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 Obstructive hydrocephalus (HCC) 08/28/2019  . Aphasia due to acute stroke (HCC) 08/28/2019  . ICH (intracerebral hemorrhage) (HCC)-L ICH w/ IVH s/p crani 08/25/2019    Aerika Groll, Radene Journey 11/20/2019, 1:10 PM  [facility] [address] [place], [place], [address] Phone: [phone]   Fax:  [phone]   Name: ERANDY MCEACHERN MRN: [id number] Date of Birth: Sep 06, 1981

## 2019-11-20 NOTE — Patient Instructions (Signed)
  I will write goals for reading, writing, listening and talking  If you get any information from disability - bring it in- we can use it for reading  Try the homework

## 2019-11-20 NOTE — Telephone Encounter (Signed)
I called to check the status it is still pending.  °

## 2019-11-21 ENCOUNTER — Ambulatory Visit: Payer: 59

## 2019-11-21 DIAGNOSIS — R4701 Aphasia: Secondary | ICD-10-CM

## 2019-11-21 NOTE — Therapy (Signed)
[facility] [address] [place], [place], [address] Phone: [phone]   Fax:  [phone]  Speech Language Pathology Treatment  Patient Details  Name: Amanda Small MRN: [id number] Date of Birth: 16-Oct-1980 Referring Provider (SLP): Dr. Delia Heady   Encounter Date: 11/21/2019  End of Session - 11/21/19 1705    Visit Number  2    Number of Visits  17    Date for SLP Re-Evaluation  01/15/20    SLP Start Time  0804    SLP Stop Time   0845    SLP Time Calculation (min)  41 min    Activity Tolerance  Patient tolerated treatment well       History reviewed. No pertinent past medical history.  Past Surgical History:  Procedure Laterality Date  . CRANIOTOMY Left 08/25/2019   Procedure: LEFT CRANIECTOMY;  Surgeon: Coletta Memos, MD;  Location: [facility] OR;  Service: Neurosurgery;  Laterality: Left;  . IR ANGIO INTRA EXTRACRAN SEL INTERNAL CAROTID BILAT MOD SED  08/31/2019  . IR ANGIO VERTEBRAL SEL VERTEBRAL UNI L MOD SED  08/31/2019  . VENTRICULOSTOMY Right 08/25/2019   Procedure: RIGHT FRONTAL VENTRICULAR CATHETER;  Surgeon: Coletta Memos, MD;  Location: [facility] OR;  Service: Neurosurgery;  Laterality: Right;    There were no vitals filed for this visit.  Subjective Assessment - 11/21/19 1703    Subjective  "Yah - I had to get help" (with texts from baby's father)    Currently in Pain?  No/denies            ADULT SLP TREATMENT - 11/21/19 0842      General Information   Behavior/Cognition  Alert;Cooperative;Pleasant mood      Treatment Provided   Treatment provided  Cognitive-Linquistic      Cognitive-Linquistic Treatment   Treatment focused on  Aphasia    Skilled Treatment  Pt wrote mock grocery list - with spontaneous compensations "milk (not)" for almond milk. SLP educated pt with language/anomia compensations today (see pt eduation) and provided pt 2-3 examples of each to ensure pt understanding. SLP also  pointed out during session when pt spontaneously used circumlocution and description strategies (x3; two describe, one cirumlocution).       Assessment / Recommendations / Plan   Plan  Continue with current plan of care      Progression Toward Goals   Progression toward goals  Progressing toward goals       SLP Education - 11/21/19 1705    Education Details  compensations for anomia    Person(s) Educated  Patient    Methods  Explanation;Handout;Demonstration    Comprehension  Verbalized understanding;Returned demonstration;Need further instruction       SLP Short Term Goals - 11/21/19 1707      SLP SHORT TERM GOAL #1   Title  Pt will utilize compensatory strategies to read/comprehend 2 mildly complex sentence messges with occasional min A.    Baseline  1 simple sentence    Time  4    Period  Weeks    Status  On-going      SLP SHORT TERM GOAL #2   Title  Pt will ID and correct errors in simple written sentence with occasional min A over 2 sessions    Baseline  Not able to correct written errors    Time  4    Period  Weeks    Status  On-going      SLP SHORT TERM GOAL #3  Title  Pt will utilize compensatory strategies to comprehend mildly complex verbal questions/commands with occasional min A    Baseline  no strategies    Time  4    Period  Weeks    Status  On-going       SLP Long Term Goals - 11/21/19 1707      SLP LONG TERM GOAL #1   Title  Pt will comprehend 4-5 senteces texts with occasional min A over 2 sessoins    Baseline  1 simple sentence    Time  8    Period  Weeks    Status  On-going      SLP LONG TERM GOAL #2   Title  Pt will text simple 2-3 sentece text, correcting errors with occasional min A over 2 sessions    Baseline  not correcting written errors    Time  8    Period  Weeks    Status  On-going      SLP LONG TERM GOAL #3   Title  Pt will utilize speech to text or other compensations to comprehend 2 paragraph emails with occasional min A over  2 sessions    Baseline  no compensations for email comprehension    Time  8    Period  Weeks    Status  On-going      SLP LONG TERM GOAL #4   Title  Pt will utilize compensations for aphasia to successfully participate in 8 minute business call (MD, insurance, pharmacy, work, etc) with occasional min A over 2 sessions    Baseline  no successful strategies for phone converations    Time  8    Period  Weeks    Status  On-going       Plan - 11/21/19 1706    Clinical Impression Statement  Pt was educated today on anomia compensations and used 2 of them spontaneously. See "skilled intervention" for more details. I recommend cont'd skilled ST to maximize communication for independence, safety of her and her baby and comprehension of medical and insurance information.    Speech Therapy Frequency  3x / week    Duration  --   8 weeks or 17 visits   Treatment/Interventions  Language facilitation;Environmental controls;Cueing hierarchy;SLP instruction and feedback;Compensatory strategies;Functional tasks;Cognitive reorganization;Compensatory techniques;Internal/external aids;Multimodal communcation approach;Patient/family education       Patient will benefit from skilled therapeutic intervention in order to improve the following deficits and impairments:   Aphasia    Problem List Patient Active Problem List   Diagnosis Date Noted  . Intracranial venous  - L vein of [place] 09/05/2019  . Hyperlipemia 09/05/2019  . Obesity 09/05/2019  . Marijuana use 09/05/2019  . Hypokalemia 09/05/2019  . Cytotoxic brain edema ([place]) 08/28/2019  . Obstructive hydrocephalus ([place]) 08/28/2019  . Aphasia due to acute stroke ([place]) 08/28/2019  . ICH (intracerebral hemorrhage) (HCC)-L ICH w/ IVH s/p crani 08/25/2019    [facility] ,MS, CCC-SLP  11/21/2019, 5:08 PM  [facility] [address] [place], [place], [address] Phone: [phone]   Fax:   [phone]   Name: Amanda Small MRN: [id number] Date of Birth: 14-Apr-1981

## 2019-11-21 NOTE — Patient Instructions (Signed)
   Ways to compensate for saying the word you want to say:  1) Synonym - pick a different word that means the same thing  2) Describe it (works for people, places, or objects)- tell us about it  3) Say it a totally different way - rephrase ================================ 4) Gestures   5) Drawing

## 2019-11-24 ENCOUNTER — Other Ambulatory Visit: Payer: Self-pay

## 2019-11-24 ENCOUNTER — Ambulatory Visit: Payer: 59

## 2019-11-24 DIAGNOSIS — R4701 Aphasia: Secondary | ICD-10-CM | POA: Diagnosis not present

## 2019-11-24 NOTE — Therapy (Signed)
[facility] [address] [place], [place], [address] Phone: [phone]   Fax:  [phone]  Speech Language Pathology Treatment  Patient Details  Name: Amanda Small MRN: [id number] Date of Birth: 09/08/1981 Referring Provider (SLP): Dr. Delia Heady   Encounter Date: 11/24/2019  End of Session - 11/24/19 1312    Visit Number  3    Number of Visits  17    Date for SLP Re-Evaluation  01/15/20    SLP Start Time  1150    SLP Stop Time   1230    SLP Time Calculation (min)  40 min    Activity Tolerance  Patient tolerated treatment well       History reviewed. No pertinent past medical history.  Past Surgical History:  Procedure Laterality Date  . CRANIOTOMY Left 08/25/2019   Procedure: LEFT CRANIECTOMY;  Surgeon: Coletta Memos, MD;  Location: [facility] OR;  Service: Neurosurgery;  Laterality: Left;  . IR ANGIO INTRA EXTRACRAN SEL INTERNAL CAROTID BILAT MOD SED  08/31/2019  . IR ANGIO VERTEBRAL SEL VERTEBRAL UNI L MOD SED  08/31/2019  . VENTRICULOSTOMY Right 08/25/2019   Procedure: RIGHT FRONTAL VENTRICULAR CATHETER;  Surgeon: Coletta Memos, MD;  Location: [facility] OR;  Service: Neurosurgery;  Laterality: Right;    There were no vitals filed for this visit.  Subjective Assessment - 11/24/19 1156    Subjective  SLP asked pt about reviewing texts from son's father - he is now calling.    Currently in Pain?  No/denies            ADULT SLP TREATMENT - 11/24/19 1157      General Information   Behavior/Cognition  Alert;Cooperative;Pleasant mood      Treatment Provided   Treatment provided  Cognitive-Linquistic      Cognitive-Linquistic Treatment   Treatment focused on  Aphasia    Skilled Treatment  SLP worked with pt using semantic feature analysis (SFA)- common items x3. Pt req'd mod A usually for anomia, and mod A usually for reading entries on SFA sheet ("location. You find it in the ___"). By the third picture pt  cueing faded to occasional min-mod for reading entries but anomia cueing remained the same level. Pt encouraged to write her responses after she stated them, min A occasionally for spelling errors - pt largely unaware of these.  Pt going to [place] this weekend and stated she had people there to assist her with SFA there - took 6 pictures of common items for homework.      Assessment / Recommendations / Plan   Plan  Continue with current plan of care      Progression Toward Goals   Progression toward goals  Progressing toward goals       SLP Education - 11/24/19 1312    Education Details  semantic feature analysis rationale    Person(s) Educated  Patient    Methods  Explanation    Comprehension  Verbalized understanding       SLP Short Term Goals - 11/24/19 1316      SLP SHORT TERM GOAL #1   Title  Pt will utilize compensatory strategies to read/comprehend 2 mildly complex sentence messges with occasional min A.    Baseline  1 simple sentence    Time  4    Period  Weeks    Status  On-going      SLP SHORT TERM GOAL #2   Title  Pt will ID and correct errors  in simple written sentence with occasional min A over 2 sessions    Baseline  Not able to correct written errors    Time  4    Period  Weeks    Status  On-going      SLP SHORT TERM GOAL #3   Title  Pt will utilize compensatory strategies to comprehend mildly complex verbal questions/commands with occasional min A    Baseline  no strategies    Time  4    Period  Weeks    Status  On-going       SLP Long Term Goals - 11/24/19 1316      SLP LONG TERM GOAL #1   Title  Pt will comprehend 4-5 senteces texts with occasional min A over 2 sessoins    Baseline  1 simple sentence    Time  8    Period  Weeks    Status  On-going      SLP LONG TERM GOAL #2   Title  Pt will text simple 2-3 sentece text, correcting errors with occasional min A over 2 sessions    Baseline  not correcting written errors    Time  8    Period   Weeks    Status  On-going      SLP LONG TERM GOAL #3   Title  Pt will utilize speech to text or other compensations to comprehend 2 paragraph emails with occasional min A over 2 sessions    Baseline  no compensations for email comprehension    Time  8    Period  Weeks    Status  On-going      SLP LONG TERM GOAL #4   Title  Pt will utilize compensations for aphasia to successfully participate in 8 minute business call (MD, insurance, pharmacy, work, etc) with occasional min A over 2 sessions    Baseline  no successful strategies for phone converations    Time  8    Period  Weeks    Status  On-going       Plan - 11/24/19 1313    Clinical Impression Statement  Pt presented today with cont'd expressive language deficits hindering effective communication that would endanger pt and children at this time. SLP req'd to provide cues and allow extra time for simple-mod complex conversation. I recommend cont'd skilled ST to maximize communication for independence, safety of her and her baby and comprehension of medical and insurance information.    Speech Therapy Frequency  3x / week    Duration  --   8 weeks or 17 visits   Treatment/Interventions  Language facilitation;Environmental controls;Cueing hierarchy;SLP instruction and feedback;Compensatory strategies;Functional tasks;Cognitive reorganization;Compensatory techniques;Internal/external aids;Multimodal communcation approach;Patient/family education       Patient will benefit from skilled therapeutic intervention in order to improve the following deficits and impairments:   Aphasia    Problem List Patient Active Problem List   Diagnosis Date Noted  . Intracranial venous  - L vein of [place] 09/05/2019  . Hyperlipemia 09/05/2019  . Obesity 09/05/2019  . Marijuana use 09/05/2019  . Hypokalemia 09/05/2019  . 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 Obstructive hydrocephalus (HCC) 08/28/2019  . Aphasia due to acute stroke (HCC)  08/28/2019  . ICH (intracerebral hemorrhage) (HCC)-L ICH w/ IVH s/p crani 08/25/2019    [facility] ,MS, CCC-SLP  11/24/2019, 1:17 PM  [facility] [address] [place], [place], [address] Phone: [phone]   Fax:  [phone]  Name: Amanda Small MRN: [id number] Date of Birth: Sep 18, 1981

## 2019-11-24 NOTE — Patient Instructions (Signed)
  Please complete the assigned speech therapy homework prior to your next session and return it to the speech therapist at your next visit.  

## 2019-11-27 ENCOUNTER — Other Ambulatory Visit: Payer: Self-pay

## 2019-11-27 ENCOUNTER — Ambulatory Visit: Payer: 59 | Admitting: Speech Pathology

## 2019-11-27 ENCOUNTER — Encounter: Payer: Self-pay | Admitting: Speech Pathology

## 2019-11-27 DIAGNOSIS — R4701 Aphasia: Secondary | ICD-10-CM | POA: Diagnosis not present

## 2019-11-27 NOTE — Telephone Encounter (Signed)
[employer] did not approve the images.   "Based on the information submitted with your initial notification request a physician to physician discussion is required to complete the notification process. The ordering physician should call [phone] and select option 3 to engage in a physician to physician discussion. "  The case number is [id number] and [id number]. When I called to check the status they didn't give me a time line on when it needs to be done by so sooner the better.

## 2019-11-27 NOTE — Telephone Encounter (Signed)
Which test was denied MRI brain or MRV brain ?

## 2019-11-27 NOTE — Therapy (Signed)
[facility] [address] [place], [place], [address] Phone: [phone]   Fax:  [phone]  Speech Language Pathology Treatment  Patient Details  Name: Amanda Small MRN: [id number] Date of Birth: 09/14/1981 Referring Provider (SLP): Dr. Delia Heady   Encounter Date: 11/27/2019  End of Session - 11/27/19 1220    Visit Number  4    Number of Visits  25    Date for SLP Re-Evaluation  01/15/20    SLP Start Time  0849    SLP Stop Time   0930    SLP Time Calculation (min)  41 min       History reviewed. No pertinent past medical history.  Past Surgical History:  Procedure Laterality Date  . CRANIOTOMY Left 08/25/2019   Procedure: LEFT CRANIECTOMY;  Surgeon: Coletta Memos, MD;  Location: [facility] OR;  Service: Neurosurgery;  Laterality: Left;  . IR ANGIO INTRA EXTRACRAN SEL INTERNAL CAROTID BILAT MOD SED  08/31/2019  . IR ANGIO VERTEBRAL SEL VERTEBRAL UNI L MOD SED  08/31/2019  . VENTRICULOSTOMY Right 08/25/2019   Procedure: RIGHT FRONTAL VENTRICULAR CATHETER;  Surgeon: Coletta Memos, MD;  Location: [facility] OR;  Service: Neurosurgery;  Laterality: Right;    There were no vitals filed for this visit.  Subjective Assessment - 11/27/19 1210    Subjective  "I got angry at him, but them I saw why I needed to do this" re: Semantic Feature Analysis sheet    Currently in Pain?  No/denies            ADULT SLP TREATMENT - 11/27/19 0001      General Information   Behavior/Cognition  Alert;Cooperative;Pleasant mood      Treatment Provided   Treatment provided  Cognitive-Linquistic      Cognitive-Linquistic Treatment   Treatment focused on  Aphasia;Patient/family/caregiver education    Skilled Psychiatrist for compensationsn for reduced auditory comprehension provided. Pt and ST generated 3 strategies she can use at her physician's office or with insurance/disability including asking for slow rate of speech, repeating  back what she has heard and educating her communication partner about aphasia prior to interaction. Pt requesting to work on reading comprehension. Instructed her to use an index card to stay on the correct line. She required frequent mod to max A to read mid freqeuncy words at word level. Cueing her to spell aloud was helpful 3/5x in ID'ing the written word. Pt is unable to apply for unemployment or disablity due to not being able to read the website and spell correctly on the forms. She did not identify a person who could help her with this.       Assessment / Recommendations / Plan   Plan  Continue with current plan of care      Progression Toward Goals   Progression toward goals  Progressing toward goals       SLP Education - 11/27/19 1219    Education Details  compensations for aphasia    Person(s) Educated  Patient    Methods  Explanation;Demonstration;Verbal cues    Comprehension  Verbalized understanding;Returned demonstration;Need further instruction       SLP Short Term Goals - 11/27/19 1220      SLP SHORT TERM GOAL #1   Title  Pt will utilize compensatory strategies to read/comprehend 2 mildly complex sentence messges with occasional min A.    Baseline  1 simple sentence    Time  3    Period  Weeks  Status  On-going      SLP SHORT TERM GOAL #2   Title  Pt will ID and correct errors in simple written sentence with occasional min A over 2 sessions    Baseline  Not able to correct written errors    Time  3    Period  Weeks    Status  On-going      SLP SHORT TERM GOAL #3   Title  Pt will utilize compensatory strategies to comprehend mildly complex verbal questions/commands with occasional min A    Baseline  no strategies    Time  3    Period  Weeks    Status  On-going       SLP Long Term Goals - 11/27/19 1220      SLP LONG TERM GOAL #1   Title  Pt will comprehend 4-5 senteces texts with occasional min A over 2 sessoins    Baseline  1 simple sentence    Time  7     Period  Weeks    Status  On-going      SLP LONG TERM GOAL #2   Title  Pt will text simple 2-3 sentece text, correcting errors with occasional min A over 2 sessions    Baseline  not correcting written errors    Time  7    Period  Weeks    Status  On-going      SLP LONG TERM GOAL #3   Title  Pt will utilize speech to text or other compensations to comprehend 2 paragraph emails with occasional min A over 2 sessions    Baseline  no compensations for email comprehension    Time  7    Period  Weeks    Status  On-going      SLP LONG TERM GOAL #4   Title  Pt will utilize compensations for aphasia to successfully participate in 8 minute business call (MD, insurance, pharmacy, work, etc) with occasional min A over 2 sessions    Baseline  no successful strategies for phone converations    Time  7    Period  Weeks    Status  On-going       Plan - 11/27/19 1219    Clinical Impression Statement  Pt presented today with cont'd expressive language deficits hindering effective communication that would endanger pt and children at this time. SLP req'd to provide cues and allow extra time for simple-mod complex conversation. I recommend cont'd skilled ST to maximize communication for independence, safety of her and her baby and comprehension of medical and insurance information.    Speech Therapy Frequency  3x / week    Treatment/Interventions  Language facilitation;Environmental controls;Cueing hierarchy;SLP instruction and feedback;Compensatory strategies;Functional tasks;Cognitive reorganization;Compensatory techniques;Internal/external aids;Multimodal communcation approach;Patient/family education    Potential to Achieve Goals  Good    Potential Considerations  Financial resources;Family/community support       Patient will benefit from skilled therapeutic intervention in order to improve the following deficits and impairments:   Aphasia    Problem List Patient Active Problem List    Diagnosis Date Noted  . Intracranial venous  - L vein of [place] 09/05/2019  . Hyperlipemia 09/05/2019  . Obesity 09/05/2019  . Marijuana use 09/05/2019  . Hypokalemia 09/05/2019  . 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 Obstructive hydrocephalus (HCC) 08/28/2019  . Aphasia due to acute stroke (HCC) 08/28/2019  . ICH (intracerebral hemorrhage) (HCC)-L ICH w/ IVH s/p crani 08/25/2019    [place],  Annye Rusk MS, CCC-SLP 11/27/2019, 12:21 PM  [facility] [address] [place], [place], [address] Phone: [phone]   Fax:  [phone]   Name: VANNESSA GODOWN MRN: [id number] Date of Birth: 03-26-1981

## 2019-11-28 NOTE — Telephone Encounter (Signed)
The information I got from [facility] was both.

## 2019-11-28 NOTE — Telephone Encounter (Signed)
I called to do nurse review for MRV head and MRI brain results. MRV head is [id number] cert from 10/23/252 to 01/12/2020.  MR brain [phone] 11/28/2019 to 01/12/2020.Clincal questions and rationale was answer per note.

## 2019-11-29 ENCOUNTER — Ambulatory Visit: Payer: 59 | Admitting: Speech Pathology

## 2019-11-29 NOTE — Telephone Encounter (Signed)
Noted, thank you. Patient also had medicaid but GI will obtain the auth for that and reach out to the patient to schedule.

## 2019-12-04 ENCOUNTER — Ambulatory Visit: Payer: 59 | Admitting: Speech Pathology

## 2019-12-04 ENCOUNTER — Encounter: Payer: Self-pay | Admitting: Speech Pathology

## 2019-12-04 ENCOUNTER — Other Ambulatory Visit: Payer: Self-pay

## 2019-12-04 DIAGNOSIS — R4701 Aphasia: Secondary | ICD-10-CM | POA: Diagnosis not present

## 2019-12-05 NOTE — Therapy (Signed)
[facility] [address] [place], [place], [address] Phone: [phone]   Fax:  [phone]  Speech Language Pathology Treatment  Patient Details  Name: Amanda Small MRN: [id number] Date of Birth: 03/03/1981 Referring Provider (SLP): Dr. Delia Heady   Encounter Date: 12/04/2019  End of Session - 12/05/19 2028    Visit Number  5    Number of Visits  25    Date for SLP Re-Evaluation  01/15/20    SLP Start Time  1318    SLP Stop Time   1400    SLP Time Calculation (min)  42 min    Activity Tolerance  Patient tolerated treatment well       History reviewed. No pertinent past medical history.  Past Surgical History:  Procedure Laterality Date  . CRANIOTOMY Left 08/25/2019   Procedure: LEFT CRANIECTOMY;  Surgeon: Coletta Memos, MD;  Location: [facility] OR;  Service: Neurosurgery;  Laterality: Left;  . IR ANGIO INTRA EXTRACRAN SEL INTERNAL CAROTID BILAT MOD SED  08/31/2019  . IR ANGIO VERTEBRAL SEL VERTEBRAL UNI L MOD SED  08/31/2019  . VENTRICULOSTOMY Right 08/25/2019   Procedure: RIGHT FRONTAL VENTRICULAR CATHETER;  Surgeon: Coletta Memos, MD;  Location: [facility] OR;  Service: Neurosurgery;  Laterality: Right;    There were no vitals filed for this visit.  Subjective Assessment - 12/04/19 1504    Subjective  "I did the HW, but I had to get some help"    Currently in Pain?  No/denies            ADULT SLP TREATMENT - 12/04/19 1505      General Information   Behavior/Cognition  Alert;Cooperative;Pleasant mood      Treatment Provided   Treatment provided  Cognitive-Linquistic      Cognitive-Linquistic Treatment   Treatment focused on  Aphasia;Patient/family/caregiver education    Skilled Treatment  Caryle reports driving 45 minutes to [place] then back. I provided her an Aphasia ID card should she get pulled over or have trouble on the road. She completed HW successfully for reading/writing at word level. Reading  comprehension targeted today in 3-5 word sentences with usual min verbal cues required. Marriah inconsistently benefitted from spelling the word aloud or having ST spell the word aloud. Halting and alexic episodes 1/4 words on average. Speech of reading is improving.      Assessment / Recommendations / Plan   Plan  Continue with current plan of care      Progression Toward Goals   Progression toward goals  Progressing toward goals       SLP Education - 12/04/19 1510    Education Details  compensations for aphasia including aquired alexia    Person(s) Educated  Patient    Methods  Explanation;Demonstration;Verbal cues    Comprehension  Verbalized understanding;Returned demonstration;Verbal cues required;Need further instruction       SLP Short Term Goals - 12/04/19 1513      SLP SHORT TERM GOAL #1   Title  Pt will utilize compensatory strategies to read/comprehend 2 mildly complex sentence messges with occasional min A.    Baseline  1 simple sentence    Time  3    Period  Weeks    Status  On-going      SLP SHORT TERM GOAL #2   Title  Pt will ID and correct errors in simple written sentence with occasional min A over 2 sessions    Baseline  Not able to correct written errors  Time  3    Period  Weeks    Status  On-going      SLP SHORT TERM GOAL #3   Title  Pt will utilize compensatory strategies to comprehend mildly complex verbal questions/commands with occasional min A    Baseline  no strategies    Time  3    Period  Weeks    Status  On-going       SLP Long Term Goals - 12/05/19 2027      SLP LONG TERM GOAL #1   Title  Pt will comprehend 4-5 senteces texts with occasional min A over 2 sessoins    Baseline  1 simple sentence    Time  6    Period  Weeks    Status  On-going      SLP LONG TERM GOAL #2   Title  Pt will text simple 2-3 sentece text, correcting errors with occasional min A over 2 sessions    Baseline  not correcting written errors    Time  6     Period  Weeks    Status  On-going      SLP LONG TERM GOAL #3   Title  Pt will utilize speech to text or other compensations to comprehend 2 paragraph emails with occasional min A over 2 sessions    Baseline  no compensations for email comprehension    Time  6    Period  Weeks    Status  On-going      SLP LONG TERM GOAL #4   Title  Pt will utilize compensations for aphasia to successfully participate in 8 minute business call (MD, insurance, pharmacy, work, etc) with occasional min A over 2 sessions    Baseline  no successful strategies for phone converations    Time  6    Period  Weeks    Status  On-going       Plan - 12/04/19 1510    Clinical Impression Statement  Pt presented today with cont'd expressive language deficits hindering effective communication that could endanger pt and children at this time. SLP req'd to provide cues and allow extra time for simple-mod complex conversation. Reading comprhension improving speed I recommend cont'd skilled ST to maximize communication for independence, safety of her and her baby and comprehension of medical and insurance information.    Speech Therapy Frequency  3x / week    Duration  --   8 weeks or 17 visits   Treatment/Interventions  Language facilitation;Environmental controls;Cueing hierarchy;SLP instruction and feedback;Compensatory strategies;Functional tasks;Cognitive reorganization;Compensatory techniques;Internal/external aids;Multimodal communcation approach;Patient/family education    Potential to Achieve Goals  Good    Potential Considerations  Financial resources;Family/community support       Patient will benefit from skilled therapeutic intervention in order to improve the following deficits and impairments:   Aphasia    Problem List Patient Active Problem List   Diagnosis Date Noted  . Intracranial venous  - L vein of [place] 09/05/2019  . Hyperlipemia 09/05/2019  . Obesity 09/05/2019  . Marijuana use 09/05/2019   . Hypokalemia 09/05/2019  . Cytotoxic brain edema ([place]) 08/28/2019  . Obstructive hydrocephalus ([place]) 08/28/2019  . Aphasia due to acute stroke ([place]) 08/28/2019  . ICH (intracerebral hemorrhage) (HCC)-L ICH w/ IVH s/p crani 08/25/2019    Suheyb Raucci, Annye Rusk MS, CCC-SLP 12/05/2019, 8:29 PM  [facility] [address] [place], [place], [address] Phone: [phone]   Fax:  [phone]   Name:  TASHEENA WAMBOLT MRN: [id number] Date of Birth: [birth year]

## 2019-12-06 ENCOUNTER — Ambulatory Visit: Payer: 59 | Admitting: Speech Pathology

## 2019-12-08 ENCOUNTER — Ambulatory Visit: Payer: 59

## 2019-12-11 ENCOUNTER — Encounter: Payer: Self-pay | Admitting: Speech Pathology

## 2019-12-11 ENCOUNTER — Encounter: Payer: 59 | Admitting: Speech Pathology

## 2019-12-11 ENCOUNTER — Ambulatory Visit: Payer: 59 | Admitting: Speech Pathology

## 2019-12-11 ENCOUNTER — Telehealth: Payer: Self-pay | Admitting: *Deleted

## 2019-12-11 ENCOUNTER — Other Ambulatory Visit: Payer: Self-pay

## 2019-12-11 DIAGNOSIS — R4701 Aphasia: Secondary | ICD-10-CM

## 2019-12-11 NOTE — Telephone Encounter (Signed)
Pt physician statement form on Katrina des.

## 2019-12-11 NOTE — Telephone Encounter (Signed)
Pt return to [employer] ask for a refund, per pt she don't want [facility] to fill out her form.

## 2019-12-11 NOTE — Telephone Encounter (Signed)
Per Stanton Kidney pt showed up about disability paperwork. Pt was advised by Kinnie Feil and Debra provider is out office till March 29,2021. Pt was also advised form can take up to 10 working days for completion.Form was given to nurse on 12/05/2019.

## 2019-12-11 NOTE — Telephone Encounter (Signed)
Pt sister [name] called to check on the status of paperwork and advised provider will not be in until 3/29th. Advised a refund could be processed but paperwork would have to be filled at another office and a check would be mailed to her for paperwork fee amount.   Pt sister states she will have to call back FYI

## 2019-12-11 NOTE — Therapy (Signed)
[facility] [address] [place], [place], [address] Phone: [phone]   Fax:  [phone]  Speech Language Pathology Treatment  Patient Details  Name: Amanda Small MRN: [id number] Date of Birth: [birth year] Referring Provider (SLP): Dr. Delia Heady   Encounter Date: 12/11/2019  End of Session - 12/11/19 1519    Visit Number  6    Number of Visits  17    Date for SLP Re-Evaluation  01/15/20    Authorization Type  medicaid starts 12/21/19, had private insurance prior to [employer]. Will request more visits after initial 3 in April       History reviewed. No pertinent past medical history.  Past Surgical History:  Procedure Laterality Date  . CRANIOTOMY Left 08/25/2019   Procedure: LEFT CRANIECTOMY;  Surgeon: Coletta Memos, MD;  Location: [facility] OR;  Service: Neurosurgery;  Laterality: Left;  . IR ANGIO INTRA EXTRACRAN SEL INTERNAL CAROTID BILAT MOD SED  08/31/2019  . IR ANGIO VERTEBRAL SEL VERTEBRAL UNI L MOD SED  08/31/2019  . VENTRICULOSTOMY Right 08/25/2019   Procedure: RIGHT FRONTAL VENTRICULAR CATHETER;  Surgeon: Coletta Memos, MD;  Location: [facility] OR;  Service: Neurosurgery;  Laterality: Right;    There were no vitals filed for this visit.  Subjective Assessment - 12/11/19 1507    Subjective  "I got food stamps, my friend helped me!"    Currently in Pain?  No/denies            ADULT SLP TREATMENT - 12/11/19 1508      General Information   Behavior/Cognition  Alert;Cooperative;Pleasant mood      Treatment Provided   Treatment provided  Cognitive-Linquistic      Cognitive-Linquistic Treatment   Treatment focused on  Aphasia;Patient/family/caregiver education    Skilled Treatment  Amanda Small had a friend help her get food stamps and apply for unemployment due to difficulty reading and writing. She is following up with disability today. Amanda Small reports a successful conversation with a friend she has not seen  in 1 year - She is having difficulty ordering in restaurants and stating her likes/dislikes. she had a friend order for her. Amanda Small improvement in word finding, Targeted compensations for aphasia in structured speech task with occasional min A. Reading comprehension of 4-5 word sentences with occasional min A verbal cues. Speech/efficiency of reading improving. Written expression with usual aphasic errors, however pt ID's errors with occasional min A. She required usual min to mod A to correct written error at word level.       Assessment / Recommendations / Plan   Plan  Continue with current plan of care      Progression Toward Goals   Progression toward goals  Progressing toward goals         SLP Short Term Goals - 12/11/19 1517      SLP SHORT TERM GOAL #1   Title  Pt will utilize compensatory strategies to read/comprehend 2 mildly complex sentence messges with occasional min A.    Baseline  1 simple sentence    Time  2    Period  Weeks    Status  On-going      SLP SHORT TERM GOAL #2   Title  Pt will ID and correct errors in simple written sentence with occasional min A over 2 sessions    Baseline  Not able to correct written errors    Time  2    Period  Weeks    Status  On-going  SLP SHORT TERM GOAL #3   Title  Pt will utilize compensatory strategies to comprehend mildly complex verbal questions/commands with occasional min A    Baseline  no strategies    Time  2    Period  Weeks    Status  On-going       SLP Long Term Goals - 12/11/19 1518      SLP LONG TERM GOAL #1   Title  Pt will comprehend 4-5 senteces texts with occasional min A over 2 sessoins    Baseline  1 simple sentence    Time  5    Period  Weeks    Status  On-going      SLP LONG TERM GOAL #2   Title  Pt will text simple 2-3 sentece text, correcting errors with occasional min A over 2 sessions    Baseline  not correcting written errors    Time  5    Period  Weeks    Status  On-going      SLP  LONG TERM GOAL #3   Title  Pt will utilize speech to text or other compensations to comprehend 2 paragraph emails with occasional min A over 2 sessions    Baseline  no compensations for email comprehension    Time  5    Period  Weeks    Status  On-going      SLP LONG TERM GOAL #4   Title  Pt will utilize compensations for aphasia to successfully participate in 8 minute business call (MD, insurance, pharmacy, work, etc) with occasional min A over 2 sessions    Baseline  no successful strategies for phone converations    Time  5    Period  Weeks    Status  On-going       Plan - 12/11/19 1517    Clinical Impression Statement  Pt presented today with cont'd expressive language deficits hindering effective communication that could endanger pt and children at this time. SLP req'd to provide cues and allow extra time for simple-mod complex conversation. Reading comprhension improving speed I recommend cont'd skilled ST to maximize communication for independence, safety of her and her baby and comprehension of medical and insurance information.    Speech Therapy Frequency  2x / week    Duration  --   8 weeks or 17 visits   Treatment/Interventions  Language facilitation;Environmental controls;Cueing hierarchy;SLP instruction and feedback;Compensatory strategies;Functional tasks;Cognitive reorganization;Compensatory techniques;Internal/external aids;Multimodal communcation approach;Patient/family education    Potential to Achieve Goals  Good    Potential Considerations  Financial resources;Family/community support       Patient will benefit from skilled therapeutic intervention in order to improve the following deficits and impairments:   Aphasia    Problem List Patient Active Problem List   Diagnosis Date Noted  . Intracranial venous  - L vein of [place] 09/05/2019  . Hyperlipemia 09/05/2019  . Obesity 09/05/2019  . Marijuana use 09/05/2019  . Hypokalemia 09/05/2019  . 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 Obstructive hydrocephalus ([place]) 08/28/2019  . Aphasia due to acute stroke ([place]) 08/28/2019  . ICH (intracerebral hemorrhage) (HCC)-L ICH w/ IVH s/p crani 08/25/2019    Declyn Offield, Annye Rusk MS, CCC-SLP 12/11/2019, 3:20 PM  [facility] [address] [place], [place], [address] Phone: [phone]   Fax:  [phone]   Name: KHADIJAH MASTRIANNI MRN: [id number] Date of Birth: 1980-12-11

## 2019-12-13 ENCOUNTER — Ambulatory Visit: Payer: 59 | Admitting: Speech Pathology

## 2019-12-13 ENCOUNTER — Other Ambulatory Visit: Payer: Self-pay

## 2019-12-13 DIAGNOSIS — R4701 Aphasia: Secondary | ICD-10-CM

## 2019-12-18 ENCOUNTER — Encounter: Payer: Self-pay | Admitting: Speech Pathology

## 2019-12-18 NOTE — Therapy (Signed)
[facility] [address] [place], [place], [address] Phone: [phone]   Fax:  [phone]  Speech Language Pathology Treatment  Patient Details  Name: Amanda Small MRN: [id number] Date of Birth: 06/03/1981 Referring Provider (SLP): Dr. Antony Contras   Encounter Date: 12/13/2019  End of Session - 12/18/19 1226    Visit Number  7    Number of Visits  17    Date for SLP Re-Evaluation  01/15/20    Authorization Type  medicaid starts 12/21/19, had private insurance prior to [employer]. Will request more visits after initial 3 in April    SLP Start Time  1017    SLP Stop Time   1059    SLP Time Calculation (min)  42 min    Activity Tolerance  Patient tolerated treatment well       History reviewed. No pertinent past medical history.  Past Surgical History:  Procedure Laterality Date  . CRANIOTOMY Left 08/25/2019   Procedure: LEFT CRANIECTOMY;  Surgeon: [name], MD;  Location: [facility];  Service: Neurosurgery;  Laterality: Left;  . IR ANGIO INTRA EXTRACRAN SEL INTERNAL CAROTID BILAT MOD SED  08/31/2019  . IR ANGIO VERTEBRAL SEL VERTEBRAL UNI L MOD SED  08/31/2019  . VENTRICULOSTOMY Right 08/25/2019   Procedure: RIGHT FRONTAL VENTRICULAR CATHETER;  Surgeon: [name], MD;  Location: [facility];  Service: Neurosurgery;  Laterality: Right;    There were no vitals filed for this visit.  Subjective Assessment - 12/18/19 1222    Subjective  "Look - I sent this by myself" (text)            ADULT SLP TREATMENT - 12/18/19 1219      General Information   Behavior/Cognition  Alert;Cooperative;Pleasant mood      Treatment Provided   Treatment provided  Cognitive-Linquistic      Cognitive-Linquistic Treatment   Treatment focused on  Aphasia;Patient/family/caregiver education    Skilled Treatment  Calle shows a text she sent to her baby's father re: baby wanting to be held due to teething. 3 apahsic errors, however  message is acccurate enough to be understood! Reading 4-5 sentences today with extended time, use of reading focus card and usual min to mod A of spelling word aloud and using prediction of context with verbal cues. Charmaine continues to improve, reading longer sentences each session. Written expression at sentence level with usual min A for spelling of simple words, however, as session progressed, Kymberlee required occasional min A.       Assessment / Recommendations / Plan   Plan  Continue with current plan of care      Progression Toward Goals   Progression toward goals  Progressing toward goals       SLP Education - 12/18/19 1223    Education Details  ongoing daily practice reading and writing - good progress    Person(s) Educated  Patient    Methods  Explanation;Verbal cues    Comprehension  Need further instruction       SLP Short Term Goals - 12/18/19 1225      SLP SHORT TERM GOAL #1   Title  Pt will utilize compensatory strategies to read/comprehend 2 mildly complex sentence messges with occasional min A.    Baseline  1 simple sentence    Time  2    Period  Weeks    Status  On-going      SLP SHORT TERM GOAL #2   Title  Pt  will ID and correct errors in simple written sentence with occasional min A over 2 sessions    Baseline  Not able to correct written errors    Time  2    Period  Weeks    Status  On-going      SLP SHORT TERM GOAL #3   Title  Pt will utilize compensatory strategies to comprehend mildly complex verbal questions/commands with occasional min A    Baseline  no strategies    Time  2    Period  Weeks    Status  On-going       SLP Long Term Goals - 12/18/19 1226      SLP LONG TERM GOAL #1   Title  Pt will comprehend 4-5 senteces texts with occasional min A over 2 sessoins    Baseline  1 simple sentence    Time  5    Period  Weeks    Status  On-going      SLP LONG TERM GOAL #2   Title  Pt will text simple 2-3 sentece text, correcting errors with  occasional min A over 2 sessions    Baseline  not correcting written errors    Time  5    Period  Weeks    Status  On-going      SLP LONG TERM GOAL #3   Title  Pt will utilize speech to text or other compensations to comprehend 2 paragraph emails with occasional min A over 2 sessions    Baseline  no compensations for email comprehension    Time  5    Period  Weeks    Status  On-going      SLP LONG TERM GOAL #4   Title  Pt will utilize compensations for aphasia to successfully participate in 8 minute business call (MD, insurance, pharmacy, work, etc) with occasional min A over 2 sessions    Baseline  no successful strategies for phone converations    Time  5    Period  Weeks    Status  On-going       Plan - 12/18/19 1223    Clinical Impression Statement  Pt presented today with cont'd expressive language deficits however improvement in written expression (text) to communicate basic needs of her baby to his father.  SLP req'd to provide cues and allow extra time for simple-mod complex conversation. Reading comprhension improving speed and accuracy with increasing length of phrases/sentences. Arlyce is using verbal compensations for aphasia in conversation with occasional min A. I recommend cont'd skilled ST to maximize communication for independence, safety of her and her baby and comprehension of medical and insurance information.    Speech Therapy Frequency  2x / week    Duration  --   8 weeks or 17 visits   Treatment/Interventions  Language facilitation;Environmental controls;Cueing hierarchy;SLP instruction and feedback;Compensatory strategies;Functional tasks;Cognitive reorganization;Compensatory techniques;Internal/external aids;Multimodal communcation approach;Patient/family education    Potential to Achieve Goals  Good    Potential Considerations  Financial resources;Family/community support       Patient will benefit from skilled therapeutic intervention in order to improve  the following deficits and impairments:   Aphasia    Problem List Patient Active Problem List   Diagnosis Date Noted  . Intracranial venous  - L vein of [place] 09/05/2019  . Hyperlipemia 09/05/2019  . Obesity 09/05/2019  . Marijuana use 09/05/2019  . Hypokalemia 09/05/2019  . 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 Obstructive hydrocephalus (HCC) 08/28/2019  .  Aphasia due to acute stroke (HCC) 08/28/2019  . ICH (intracerebral hemorrhage) (HCC)-L ICH w/ IVH s/p crani 08/25/2019    Wenona [place]ville, Radene Journey MS, CCC-SLP 12/18/2019, 12:27 PM  [facility] [address] [place], [place], [address] Phone: [phone]   Fax:  [phone]   Name: KAYLON LAROCHE MRN: [id number] Date of Birth: 1981-05-22

## 2019-12-19 ENCOUNTER — Encounter: Payer: 59 | Admitting: Speech Pathology

## 2019-12-25 ENCOUNTER — Encounter: Payer: Self-pay | Admitting: Speech Pathology

## 2019-12-25 ENCOUNTER — Ambulatory Visit: Payer: Medicaid Other | Attending: Neurology | Admitting: Speech Pathology

## 2019-12-25 ENCOUNTER — Other Ambulatory Visit: Payer: Self-pay

## 2019-12-25 DIAGNOSIS — R4701 Aphasia: Secondary | ICD-10-CM | POA: Insufficient documentation

## 2019-12-25 NOTE — Therapy (Signed)
[facility] [address] [place], [place], [address] Phone: [phone]   Fax:  [phone]  Speech Language Pathology Treatment  Patient Details  Name: Amanda Small MRN: [id number] Date of Birth: 01-12-1981 Referring Provider (SLP): Dr. Antony Contras   Encounter Date: 12/25/2019  End of Session - 12/25/19 1114    Visit Number  8    Number of Visits  17    Date for SLP Re-Evaluation  01/15/20    Authorization Type  medicaid starts 12/21/19, had private insurance prior to [employer]. Will request more visits after initial 3 in April    SLP Start Time  1016    SLP Stop Time   1100    SLP Time Calculation (min)  44 min    Activity Tolerance  Patient tolerated treatment well       History reviewed. No pertinent past medical history.  Past Surgical History:  Procedure Laterality Date  . CRANIOTOMY Left 08/25/2019   Procedure: LEFT CRANIECTOMY;  Surgeon: [name], MD;  Location: [facility];  Service: Neurosurgery;  Laterality: Left;  . IR ANGIO INTRA EXTRACRAN SEL INTERNAL CAROTID BILAT MOD SED  08/31/2019  . IR ANGIO VERTEBRAL SEL VERTEBRAL UNI L MOD SED  08/31/2019  . VENTRICULOSTOMY Right 08/25/2019   Procedure: RIGHT FRONTAL VENTRICULAR CATHETER;  Surgeon: [name], MD;  Location: [facility];  Service: Neurosurgery;  Laterality: Right;    There were no vitals filed for this visit.  Subjective Assessment - 12/25/19 1053    Subjective  "I read and sent a text to my baby daddy by myself"    Currently in Pain?  No/denies            ADULT SLP TREATMENT - 12/25/19 1103      General Information   Behavior/Cognition  Alert;Cooperative;Pleasant mood      Treatment Provided   Treatment provided  Cognitive-Linquistic      Cognitive-Linquistic Treatment   Treatment focused on  Aphasia;Patient/family/caregiver education    Skilled Treatment  Amanda Small continues to have success reading and sending short texts to her  child's father re: his care. Targeted reading comprehension with increased length from sentence to short 2-3 sentence paragraphs with usual min to mod verbal cues and cues to cover half of compound words, cues to spell aloud and consistently required extended time. Speed of reading high frequency words increasing. Written expression targeted at sentence level with multiple meaning words. Amanda Small wrote 3-5 word short senteneces with usual min cues for error awareness and frequent min cues to correct errors. She requires extended time consistently. Amanda Small is IDing aphasic errors in conversation with rare min A -self correcting with extended time and occasional phonemic or semantic cues. She has an appointment with physician this week. She generated and wrote down questions she has for her doctor with usual min A for error awareness.       Assessment / Recommendations / Plan   Plan  Continue with current plan of care      Progression Toward Goals   Progression toward goals  Progressing toward goals       SLP Education - 12/25/19 1111    Education Details  continue daily practice    Person(s) Educated  Patient    Methods  Explanation;Verbal cues;Demonstration    Comprehension  Verbal cues required;Need further instruction       SLP Short Term Goals - 12/25/19 1112      SLP SHORT TERM GOAL #1  Title  Pt will utilize compensatory strategies to read/comprehend 2 mildly complex sentence messges with occasional min A.    Baseline  1 simple sentence    Time  1    Period  Weeks    Status  On-going      SLP SHORT TERM GOAL #2   Title  Pt will ID and correct errors in simple written sentence with occasional min A over 2 sessions    Baseline  Not able to correct written errors    Time  1    Period  Weeks    Status  On-going      SLP SHORT TERM GOAL #3   Title  Pt will utilize compensatory strategies to comprehend mildly complex verbal questions/commands with occasional min A    Baseline  no  strategies    Time  1    Period  Weeks    Status  On-going       SLP Long Term Goals - 12/25/19 1113      SLP LONG TERM GOAL #1   Title  Pt will comprehend 4-5 senteces texts with occasional min A over 2 sessoins    Baseline  1 simple sentence    Time  4    Period  Weeks    Status  On-going      SLP LONG TERM GOAL #2   Title  Pt will text simple 2-3 sentece text, correcting errors with occasional min A over 2 sessions    Baseline  not correcting written errors    Time  4    Period  Weeks    Status  On-going      SLP LONG TERM GOAL #3   Title  Pt will utilize speech to text or other compensations to comprehend 2 paragraph emails with occasional min A over 2 sessions    Baseline  no compensations for email comprehension    Time  4    Period  Weeks    Status  On-going      SLP LONG TERM GOAL #4   Title  Pt will utilize compensations for aphasia to successfully participate in 8 minute business call (MD, insurance, pharmacy, work, etc) with occasional min A over 2 sessions    Baseline  no successful strategies for phone converations    Time  4    Period  Weeks    Status  On-going       Plan - 12/25/19 1114    Clinical Impression Statement  Pt presented today with cont'd expressive language deficits however improvement in written expression (text) to communicate basic needs of her baby to his father.  SLP req'd to provide cues and allow extra time for simple-mod complex conversation. Reading comprhension improving speed and accuracy with increasing length of phrases/sentences. Amanda Small is using verbal compensations for aphasia in conversation with occasional min A. I recommend cont'd skilled ST to maximize communication for independence, safety of her and her baby and comprehension of medical and insurance information.    Speech Therapy Frequency  2x / week   8 weeks or 17 visits   Treatment/Interventions  Language facilitation;Environmental controls;Cueing hierarchy;SLP  instruction and feedback;Compensatory strategies;Functional tasks;Cognitive reorganization;Compensatory techniques;Internal/external aids;Multimodal communcation approach;Patient/family education    Potential to Achieve Goals  Good       Patient will benefit from skilled therapeutic intervention in order to improve the following deficits and impairments:   Aphasia    Problem List Patient Active Problem List   Diagnosis Date  Noted  . Intracranial venous  - L vein of [place] 09/05/2019  . Hyperlipemia 09/05/2019  . Obesity 09/05/2019  . Marijuana use 09/05/2019  . Hypokalemia 09/05/2019  . 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 Obstructive hydrocephalus (HCC) 08/28/2019  . Aphasia due to acute stroke (HCC) 08/28/2019  . ICH (intracerebral hemorrhage) (HCC)-L ICH w/ IVH s/p crani 08/25/2019    Rithvik Orcutt, Radene Journey MS, CCC-SLP 12/25/2019, 11:15 AM  [facility] [address] [place], [place], [address] Phone: [phone]   Fax:  [phone]   Name: Amanda Small MRN: [id number] Date of Birth: [birth year]

## 2019-12-26 ENCOUNTER — Other Ambulatory Visit: Payer: 59

## 2019-12-27 ENCOUNTER — Other Ambulatory Visit: Payer: Self-pay

## 2019-12-27 ENCOUNTER — Ambulatory Visit: Payer: Medicaid Other | Admitting: Speech Pathology

## 2019-12-27 ENCOUNTER — Encounter: Payer: Self-pay | Admitting: Speech Pathology

## 2019-12-27 DIAGNOSIS — R4701 Aphasia: Secondary | ICD-10-CM

## 2019-12-27 NOTE — Therapy (Signed)
[facility] [address] [place], [place], [address] Phone: [phone]   Fax:  [phone]  Speech Language Pathology Treatment  Patient Details  Name: Amanda Small MRN: [id number] Date of Birth: [birth year] Referring Provider (SLP): Dr. Delia Heady   Encounter Date: 12/27/2019  End of Session - 12/27/19 1113    Visit Number  9    Number of Visits  17    Date for SLP Re-Evaluation  01/15/20    Authorization Type  medicaid starts 12/21/19, had private insurance prior to [employer]. Will request more visits after initial 3 in April    SLP Start Time  1018    SLP Stop Time   1102    SLP Time Calculation (min)  44 min    Activity Tolerance  Patient tolerated treatment well       History reviewed. No pertinent past medical history.  Past Surgical History:  Procedure Laterality Date  . CRANIOTOMY Left 08/25/2019   Procedure: LEFT CRANIECTOMY;  Surgeon: Coletta Memos, MD;  Location: [facility] OR;  Service: Neurosurgery;  Laterality: Left;  . IR ANGIO INTRA EXTRACRAN SEL INTERNAL CAROTID BILAT MOD SED  08/31/2019  . IR ANGIO VERTEBRAL SEL VERTEBRAL UNI L MOD SED  08/31/2019  . VENTRICULOSTOMY Right 08/25/2019   Procedure: RIGHT FRONTAL VENTRICULAR CATHETER;  Surgeon: Coletta Memos, MD;  Location: [facility] OR;  Service: Neurosurgery;  Laterality: Right;    There were no vitals filed for this visit.  Subjective Assessment - 12/27/19 1030    Subjective  Pt ran into doorframe on right upon entering the room.    Currently in Pain?  No/denies            ADULT SLP TREATMENT - 12/27/19 1028      General Information   Behavior/Cognition  Alert;Cooperative;Pleasant mood      Pain Assessment   Pain Assessment  No/denies pain      Cognitive-Linquistic Treatment   Treatment focused on  Aphasia;Patient/family/caregiver education    Skilled Treatment  Amanda Small continues to report success reading and sending short texts. She reports  she is not able to consistently read e mails or information re: disability and insurance. She states "its better if I go in person, I can't read what they need me to do" I encouraged her to bring in materials that she can't read but are vital to her health care. She agreed she can bring these in to work on them in ST.  Targeted reading a 1-2 mildly complex setnences which required money problem solving. Amanda Small was able to get what the problem was asking without accurately reading each word. We discussed how our brain uses context to predict words and meaning while reading. Amanda Small primarily had difficulty with names and words greater than 2 syllables. She solved simple money word problems with rare min A 4/5 and frequent mod A 1/5 problems. In conversation, Amanda Small is compensating for aphasia with  occasional min A.       Assessment / Recommendations / Plan   Plan  Continue with current plan of care      Progression Toward Goals   Progression toward goals  Progressing toward goals       SLP Education - 12/27/19 1111    Education Details  contonue daily reading practice. Bring in materials from insurance or disability that you are having trouble reading    Person(s) Educated  Patient    Methods  Explanation;Verbal cues    Comprehension  Verbalized  understanding       SLP Short Term Goals - 12/27/19 1113      SLP SHORT TERM GOAL #1   Title  Pt will utilize compensatory strategies to read/comprehend 2 mildly complex sentence messges with occasional min A.    Baseline  1 simple sentence    Time  1    Period  Weeks    Status  Achieved      SLP SHORT TERM GOAL #2   Title  Pt will ID and correct errors in simple written sentence with occasional min A over 2 sessions    Baseline  Not able to correct written errors    Time  1    Period  Weeks    Status  Achieved      SLP SHORT TERM GOAL #3   Title  Pt will utilize compensatory strategies to comprehend mildly complex verbal  questions/commands with occasional min A    Baseline  no strategies    Time  1    Period  Weeks    Status  Achieved       SLP Long Term Goals - 12/27/19 1113      SLP LONG TERM GOAL #1   Title  Pt will comprehend 4-5 senteces texts with occasional min A over 2 sessoins    Baseline  1 simple sentence    Time  4    Period  Weeks    Status  On-going      SLP LONG TERM GOAL #2   Title  Pt will text simple 2-3 sentece text, correcting errors with occasional min A over 2 sessions    Baseline  not correcting written errors    Time  4    Period  Weeks    Status  On-going      SLP LONG TERM GOAL #3   Title  Pt will utilize speech to text or other compensations to comprehend 2 paragraph emails with occasional min A over 2 sessions    Baseline  no compensations for email comprehension    Time  4    Period  Weeks    Status  On-going      SLP LONG TERM GOAL #4   Title  Pt will utilize compensations for aphasia to successfully participate in 8 minute business call (MD, insurance, pharmacy, work, etc) with occasional min A over 2 sessions    Baseline  no successful strategies for phone converations    Time  4    Period  Weeks    Status  On-going       Plan - 12/27/19 1112    Clinical Impression Statement  Pt presented today with cont'd expressive language deficits however improvement in written expression (text) to communicate basic needs of her baby to his father.  SLP req'd to provide cues and allow extra time for simple-mod complex conversation. Reading comprhension improving speed and accuracy with increasing length of phrases/sentences. Amanda Small is using verbal compensations for aphasia in conversation with occasional min A. I recommend cont'd skilled ST to maximize communication for independence, safety of her and her baby and comprehension of medical and insurance information.    Speech Therapy Frequency  2x / week   8 weeks or 17 visits   Treatment/Interventions  Language  facilitation;Environmental controls;Cueing hierarchy;SLP instruction and feedback;Compensatory strategies;Functional tasks;Cognitive reorganization;Compensatory techniques;Internal/external aids;Multimodal communcation approach;Patient/family education    Potential to Achieve Goals  Good    Potential Considerations  Financial resources;Family/community support  Patient will benefit from skilled therapeutic intervention in order to improve the following deficits and impairments:   Aphasia    Problem List Patient Active Problem List   Diagnosis Date Noted  . Intracranial venous  - L vein of [place] 09/05/2019  . Hyperlipemia 09/05/2019  . Obesity 09/05/2019  . Marijuana use 09/05/2019  . Hypokalemia 09/05/2019  . 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 Obstructive hydrocephalus (HCC) 08/28/2019  . Aphasia due to acute stroke (HCC) 08/28/2019  . ICH (intracerebral hemorrhage) (HCC)-L ICH w/ IVH s/p crani 08/25/2019    Courtland Coppa, Radene Journey MS, CCC-SLP 12/27/2019, 11:14 AM  [facility] [address] [place], [place], [address] Phone: [phone]   Fax:  [phone]   Name: Amanda Small MRN: [id number] Date of Birth: 05/25/1981

## 2019-12-29 ENCOUNTER — Other Ambulatory Visit: Payer: Self-pay

## 2019-12-29 ENCOUNTER — Ambulatory Visit: Payer: Medicaid Other

## 2019-12-29 DIAGNOSIS — R4701 Aphasia: Secondary | ICD-10-CM | POA: Diagnosis not present

## 2019-12-29 NOTE — Therapy (Signed)
[facility] [address] [place], [place], [address] Phone: [phone]   Fax:  [phone]  Speech Language Pathology Treatment  Patient Details  Name: Amanda Small MRN: [id number] Date of Birth: 05-Apr-1981 Referring Provider (SLP): Dr. Delia Heady   Encounter Date: 12/29/2019  End of Session - 12/29/19 1323    Visit Number  10    Number of Visits  17    SLP Start Time  1106    SLP Stop Time   1146    SLP Time Calculation (min)  40 min    Activity Tolerance  Patient tolerated treatment well       History reviewed. No pertinent past medical history.  Past Surgical History:  Procedure Laterality Date  . CRANIOTOMY Left 08/25/2019   Procedure: LEFT CRANIECTOMY;  Surgeon: Coletta Memos, MD;  Location: [facility] OR;  Service: Neurosurgery;  Laterality: Left;  . IR ANGIO INTRA EXTRACRAN SEL INTERNAL CAROTID BILAT MOD SED  08/31/2019  . IR ANGIO VERTEBRAL SEL VERTEBRAL UNI L MOD SED  08/31/2019  . VENTRICULOSTOMY Right 08/25/2019   Procedure: RIGHT FRONTAL VENTRICULAR CATHETER;  Surgeon: Coletta Memos, MD;  Location: [facility] OR;  Service: Neurosurgery;  Laterality: Right;    There were no vitals filed for this visit.  Subjective Assessment - 12/29/19 1029    Subjective  "I'll let you go first cuz I don't remember where you're at."            ADULT SLP TREATMENT - 12/29/19 1030      General Information   Behavior/Cognition  Alert;Cooperative;Pleasant mood      Pain Assessment   Pain Assessment  No/denies pain      Cognitive-Linquistic Treatment   Treatment focused on  Aphasia    Skilled Treatment  Pt's verbal expression today was functional for simple conversation given min extra time. "I already sent it in" (re: disability paperwork that pt was supposed to bring it). Pt stated she prefers to work on reading today so SLP provided pt with a functional math worksheet necessary for pt to read for comprehension. Pt  req'd SLP A occsionally for figuring out words - SLP used chunking syllables as a successful compensation for pt. She req'd min-mod A occsionally for comprehension. Homework provided for written functional math (time) problems.      Assessment / Recommendations / Plan   Plan  Continue with current plan of care   request auth from [facility] for more visits     Progression Toward Goals   Progression toward goals  Progressing toward goals         SLP Short Term Goals - 12/27/19 1113      SLP SHORT TERM GOAL #1   Title  Pt will utilize compensatory strategies to read/comprehend 2 mildly complex sentence messges with occasional min A.    Baseline  1 simple sentence    Time  1    Period  Weeks    Status  Achieved      SLP SHORT TERM GOAL #2   Title  Pt will ID and correct errors in simple written sentence with occasional min A over 2 sessions    Baseline  Not able to correct written errors    Time  1    Period  Weeks    Status  Achieved      SLP SHORT TERM GOAL #3   Title  Pt will utilize compensatory strategies to comprehend mildly complex verbal questions/commands with occasional  min A    Baseline  no strategies    Time  1    Period  Weeks    Status  Achieved       SLP Long Term Goals - 12/29/19 1324      SLP LONG TERM GOAL #1   Title  Pt will comprehend 4-5 senteces texts with occasional min A over 2 sessoins    Baseline  1 simple sentence    Time  4    Period  Weeks    Status  On-going      SLP LONG TERM GOAL #2   Title  Pt will text simple 2-3 sentece text, correcting errors with occasional min A over 2 sessions    Baseline  not correcting written errors    Time  4    Period  Weeks    Status  On-going      SLP LONG TERM GOAL #3   Title  Pt will utilize speech to text or other compensations to comprehend 2 paragraph emails with occasional min A over 2 sessions    Baseline  no compensations for email comprehension    Time  4    Period  Weeks    Status  On-going       SLP LONG TERM GOAL #4   Title  Pt will utilize compensations for aphasia to successfully participate in 8 minute business call (MD, insurance, pharmacy, work, etc) with occasional min A over 2 sessions    Baseline  no successful strategies for phone converations    Time  4    Period  Weeks    Status  On-going       Plan - 12/29/19 1323    Clinical Impression Statement  Pt presented today with cont'd expressive language deficits however improvement in written expression (text) to communicate basic needs of her baby to his father.  SLP req'd to provide cues and allow extra time for simple-mod complex conversation. Reading comprhension improving speed and accuracy with increasing length of phrases/sentences. Amanda Small is using verbal compensations for aphasia in conversation with occasional min A. I recommend cont'd skilled ST to maximize communication for independence, safety of her and her baby and comprehension of medical and insurance information.    Speech Therapy Frequency  2x / week   8 weeks or 17 visits   Treatment/Interventions  Language facilitation;Environmental controls;Cueing hierarchy;SLP instruction and feedback;Compensatory strategies;Functional tasks;Cognitive reorganization;Compensatory techniques;Internal/external aids;Multimodal communcation approach;Patient/family education    Potential to Achieve Goals  Good    Potential Considerations  Financial resources;Family/community support       Patient will benefit from skilled therapeutic intervention in order to improve the following deficits and impairments:   Aphasia    Problem List Patient Active Problem List   Diagnosis Date Noted  . Intracranial venous  - L vein of [place] 09/05/2019  . Hyperlipemia 09/05/2019  . Obesity 09/05/2019  . Marijuana use 09/05/2019  . Hypokalemia 09/05/2019  . Cytotoxic brain edema ([place]) 08/28/2019  . Obstructive hydrocephalus ([place]) 08/28/2019  . Aphasia due to acute stroke ([place])  08/28/2019  . ICH (intracerebral hemorrhage) (HCC)-L ICH w/ IVH s/p crani 08/25/2019    [facility] ,MS, CCC-SLP  12/29/2019, 1:24 PM  [facility] [address] [place], [place], [address] Phone: [phone]   Fax:  [phone]   Name: Amanda Small MRN: [id number] Date of Birth: 04-11-1981

## 2019-12-29 NOTE — Patient Instructions (Signed)
  Please complete the assigned speech therapy homework prior to your next session and return it to the speech therapist at your next visit.  

## 2020-01-02 ENCOUNTER — Telehealth: Payer: Self-pay | Admitting: Neurology

## 2020-01-02 NOTE — Telephone Encounter (Signed)
MEssage sent to DR.Sethi for review.

## 2020-01-02 NOTE — Telephone Encounter (Signed)
Pt has called re: her MRI. Pt is asking to be sedated for her MRI, the coordinator(Emily) advised RN needed to be sent a message re: this request. Please call pt

## 2020-01-03 NOTE — Telephone Encounter (Signed)
Ok to give xanax 0.5 mg 2 tablets only

## 2020-01-04 MED ORDER — ALPRAZOLAM 0.5 MG PO TABS: ORAL_TABLET | ORAL | 0 refills | Status: DC

## 2020-01-04 NOTE — Telephone Encounter (Signed)
I spoke with Dr. Pearlean Brownie, he does not recommend pt be sedated for MRI,he can give 2 to 3 xanax for MRI. He told me to advise. He also advise me pt will need a driver to and from the testing site.

## 2020-01-04 NOTE — Telephone Encounter (Signed)
I sent this in

## 2020-01-04 NOTE — Addendum Note (Signed)
Addended by: Despina Arias A on: 01/04/2020 11:38 AM   Modules accepted: Orders

## 2020-01-04 NOTE — Telephone Encounter (Signed)
I called pt that Dr.Sethi does not recommend to be sedated for MR. PT stated she had to cancel MRi because she could not tolerate her. I advise pt that per Dr Pearlean Brownie we can send in three xanax to take if needed 30 minutes prior to first test and one during test if needed. Pt began to state she was given a pill last year for her MRI and had a pain attack. She thinks her having a panic attack and her nerves and overall body cause her stroke. I advise pt to lets try the xanax to get both scans done. Pt wanted xanax sent to [facility] and she will call Gi to r/s. She verbalized understanding.

## 2020-01-08 NOTE — Telephone Encounter (Signed)
I called patient that the Xanax was sent to [facility] on [address]. I advise pt to have a driver to and from the testing site because medication will make her sleepy or drowsy. I advise her to take one tablet 30 minutes prior and take another one if needed at the imaging site. She verbalized understanding.

## 2020-01-08 NOTE — Telephone Encounter (Signed)
[employer] auth: [employer] via [employer].. patient is scheduled at [facility] for 02/02/20.

## 2020-01-20 DIAGNOSIS — R569 Unspecified convulsions: Secondary | ICD-10-CM

## 2020-01-20 HISTORY — DX: Unspecified convulsions: R56.9

## 2020-01-24 ENCOUNTER — Ambulatory Visit: Payer: Medicaid Other | Admitting: Speech Pathology

## 2020-01-29 ENCOUNTER — Ambulatory Visit: Payer: Medicaid Other | Attending: Neurology

## 2020-01-29 ENCOUNTER — Other Ambulatory Visit: Payer: Self-pay

## 2020-01-29 DIAGNOSIS — R4701 Aphasia: Secondary | ICD-10-CM | POA: Diagnosis not present

## 2020-01-29 NOTE — Therapy (Signed)
[facility] [address] [place], [place], [address] Phone: [phone]   Fax:  [phone]  Speech Language Pathology Treatment  Patient Details  Name: Amanda Small MRN: [id number] Date of Birth: 08-11-1981 Referring Provider (SLP): Dr. Delia Heady   Encounter Date: 01/29/2020  End of Session - 01/29/20 1654    Visit Number  11    Number of Visits  20    Date for SLP Re-Evaluation  04/12/20    Authorization Time Period  until 03-05-20    Authorization - Visit Number  1    Authorization - Number of Visits  10    SLP Start Time  1105    SLP Stop Time   1145    SLP Time Calculation (min)  40 min    Activity Tolerance  Patient tolerated treatment well       History reviewed. No pertinent past medical history.  Past Surgical History:  Procedure Laterality Date  . CRANIOTOMY Left 08/25/2019   Procedure: LEFT CRANIECTOMY;  Surgeon: Coletta Memos, MD;  Location: [facility] OR;  Service: Neurosurgery;  Laterality: Left;  . IR ANGIO INTRA EXTRACRAN SEL INTERNAL CAROTID BILAT MOD SED  08/31/2019  . IR ANGIO VERTEBRAL SEL VERTEBRAL UNI L MOD SED  08/31/2019  . VENTRICULOSTOMY Right 08/25/2019   Procedure: RIGHT FRONTAL VENTRICULAR CATHETER;  Surgeon: Coletta Memos, MD;  Location: [facility] OR;  Service: Neurosurgery;  Laterality: Right;    There were no vitals filed for this visit.  Subjective Assessment - 01/29/20 1109    Subjective  "I had a seizure a couple weeks ago. I was - - at my friend's house - and next think I know I was at the hospital."    Currently in Pain?  No/denies            ADULT SLP TREATMENT - 01/29/20 1110      General Information   Behavior/Cognition  Alert;Cooperative;Pleasant mood      Cognitive-Linquistic Treatment   Treatment focused on  Aphasia    Skilled Treatment  "I think I'm doing pretty well! I still don't like to talk on the phone." (due to feeling like people expect her talking to be at  baseline and are less patient with her). Pt relates that she is reading and sending texts much better now but is not at baseline. SLP read through an article pertinent to pt and req'd SLP mod-max A occasionally for decoding, but once pt read word 3-4 times she no longer req'd cueing. SLP engaged pt in 20 minutes mod complex conversation and pt functional/modified independent (extra time necessary).       Assessment / Recommendations / Plan   Plan  Continue with current plan of care      Progression Toward Goals   Progression toward goals  Progressing toward goals         SLP Short Term Goals - 01/29/20 1658      SLP SHORT TERM GOAL #1   Title  Pt will utilize compensatory strategies to read/comprehend 2 mildly complex sentence messges with occasional min A.    Baseline  1 simple sentence    Time  1    Period  Weeks    Status  Achieved      SLP SHORT TERM GOAL #2   Title  Pt will ID and correct errors in simple written sentence with occasional min A over 2 sessions    Baseline  Not able to correct written errors  Time  1    Period  Weeks    Status  Achieved      SLP SHORT TERM GOAL #3   Title  Pt will utilize compensatory strategies to comprehend mildly complex verbal questions/commands with occasional min A    Baseline  no strategies    Time  1    Period  Weeks    Status  Achieved       SLP Long Term Goals - 01/29/20 1658      SLP LONG TERM GOAL #1   Title  Pt will comprehend 4-5 senteces texts with occasional min A over 2 sessoins    Baseline  1 simple sentence    Time  4    Period  Weeks   all LTGs recerted on 6-38-75; or for 20 total sesions (for all LTGs)   Status  On-going      SLP LONG TERM GOAL #2   Title  Pt will text simple 2-3 sentece text, correcting errors with occasional min A over 2 sessions    Baseline  not correcting written errors    Time  4    Period  Weeks    Status  On-going      SLP LONG TERM GOAL #3   Title  Pt will utilize speech to text  or other compensations to comprehend 2 paragraph emails with occasional min A over 2 sessions    Baseline  no compensations for email comprehension    Status  Deferred      SLP LONG TERM GOAL #4   Title  Pt will utilize compensations for aphasia to successfully participate in 8 minute business call (MD, insurance, pharmacy, work, etc) with occasional min A over 2 sessions    Baseline  no successful strategies for phone converations    Time  4    Period  Weeks    Status  On-going      SLP LONG TERM GOAL #5   Title  pt will demo verbal skills adequate for 15 minutes mod compelx/complex conversation in 3 sessions    Time  4    Period  Weeks    Status  New       Plan - 01/29/20 1655    Clinical Impression Statement  Pt presented today with cont'd expressive language deficits (mild) however improvement in written expression  and reading comprehension (alexia) is slower. Pt is functional/modified independent (extra time) in mod complex complex conversation. I recommend cont'd skilled ST to maximize communication for independence, safety of her and her baby and comprehension of medical and insurance information.    Speech Therapy Frequency  2x / week   8 weeks or 17 visits   Treatment/Interventions  Language facilitation;Environmental controls;Cueing hierarchy;SLP instruction and feedback;Compensatory strategies;Functional tasks;Cognitive reorganization;Compensatory techniques;Internal/external aids;Multimodal communcation approach;Patient/family education    Potential to Achieve Goals  Good    Potential Considerations  Financial resources;Family/community support       Patient will benefit from skilled therapeutic intervention in order to improve the following deficits and impairments:   Aphasia    Problem List Patient Active Problem List   Diagnosis Date Noted  . Intracranial venous  - L vein of [place] 09/05/2019  . Hyperlipemia 09/05/2019  . Obesity 09/05/2019  . Marijuana use  09/05/2019  . Hypokalemia 09/05/2019  . Cytotoxic brain edema ([place]) 08/28/2019  . Obstructive hydrocephalus ([place]) 08/28/2019  . Aphasia due to acute stroke ([place]) 08/28/2019  . ICH (intracerebral hemorrhage) (HCC)-L ICH w/ IVH s/p  crani 08/25/2019    Caillou Minus ,MS, CCC-SLP  01/29/2020, 5:02 PM  [facility] [address] [place], [place], [address] Phone: [phone]   Fax:  [phone]   Name: CALIANA SPIRES MRN: [id number] Date of Birth: Aug 17, 1981

## 2020-01-31 ENCOUNTER — Ambulatory Visit: Payer: Medicaid Other | Admitting: Adult Health

## 2020-01-31 ENCOUNTER — Encounter: Payer: Self-pay | Admitting: Adult Health

## 2020-01-31 ENCOUNTER — Ambulatory Visit: Payer: Medicaid Other

## 2020-01-31 ENCOUNTER — Other Ambulatory Visit: Payer: Self-pay

## 2020-01-31 VITALS — BP 102/68 | HR 92 | Temp 97.7°F | Ht 69.0 in | Wt 175.0 lb

## 2020-01-31 DIAGNOSIS — I69398 Other sequelae of cerebral infarction: Secondary | ICD-10-CM | POA: Diagnosis not present

## 2020-01-31 DIAGNOSIS — R569 Unspecified convulsions: Secondary | ICD-10-CM

## 2020-01-31 DIAGNOSIS — I611 Nontraumatic intracerebral hemorrhage in hemisphere, cortical: Secondary | ICD-10-CM

## 2020-01-31 DIAGNOSIS — H53451 Other localized visual field defect, right eye: Secondary | ICD-10-CM | POA: Diagnosis not present

## 2020-01-31 DIAGNOSIS — I69320 Aphasia following cerebral infarction: Secondary | ICD-10-CM | POA: Diagnosis not present

## 2020-01-31 MED ORDER — ALPRAZOLAM 1 MG PO TABS: ORAL_TABLET | ORAL | 0 refills | Status: DC

## 2020-01-31 MED ORDER — LEVETIRACETAM 500 MG PO TABS: 500.0000 mg | ORAL_TABLET | Freq: Two times a day (BID) | ORAL | 3 refills | Status: DC

## 2020-01-31 NOTE — Patient Instructions (Addendum)
Obtain MRI and MRV this Friday - will send in a prescription for 3 xanax 1mg  tablets you will take prior to imaging   Continue to work with speech therapy for ongoing improvement -keep up the great work!  Continue keppra 500mg  twice daily for seizure prevention   Continue aspirin 81 mg daily for secondary stroke prevention  Continue to follow up with PCP regarding blood pressure management   Continue to monitor blood pressure at home  Maintain strict control of hypertension with blood pressure goal below 130/90, diabetes with hemoglobin A1c goal below 6.5% and cholesterol with LDL cholesterol (bad cholesterol) goal below 70 mg/dL. I also advised the patient to eat a healthy diet with plenty of whole grains, cereals, fruits and vegetables, exercise regularly and maintain ideal body weight.  Followup in the future with me in 4 months or call earlier if needed       Thank you for coming to see at [facility]. I hope we have been able to provide you high quality care today.  You may receive a patient satisfaction survey over the next few weeks. We would appreciate your feedback and comments so that we may continue to improve ourselves and the health of our patients.

## 2020-01-31 NOTE — Progress Notes (Signed)
[facility] [address] [place]. [place] [address] [phone]       OFFICE FOLLOW-UP NOTE  [name] Date of Birth:  21-Sep-1981 Medical Record Number:  [id number]    Chief complaint: Chief Complaint  Patient presents with  . Follow-up    rm 9 stroke f/u.  Stable with improvement of symptoms  . 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  Residual deficits of decreased vision right eye and mild aphasia with great improvement of language.  Continues to work with outpatient therapy.  Denies residual cognitive difficulties.  MRI w/wo and MRV scheduled on 02/02/2020.  She does report having seizure activity approximately 3 weeks ago while she was visiting her friend in [place], [place].  Unable to view records from hospitalization.  She remembers speaking with her friend and then feeling as though "something was about to happen" and then she woke up in the hospital.  She was previously on [place] for seizure prophylaxis s/p craniotomy but this was discontinued by neurosurgery.  Apparently, no recommendations such as initiating AED and was discharged home.  She decided to restart Keppra 500 mg twice daily as she had leftover medication due to concern of recurrent seizure activity.  She has not had any reoccurring seizure activity and tolerating Keppra well.  Continues on aspirin 81 mg daily without bleeding or bruising.  Blood pressure today 102/68.  No further concerns at this time.      History provided for reference purposes only Initial consult visit 10/31/2019 Dr. Leonie Man: Amanda Small is a pleasant 39 year old African-American lady seen today for initial office follow-up visit following hospital admission for intracerebral hemorrhage in December 2020.  She is accompanied by  sister.  History is obtained from them and review of electronic medical records and I personally reviewed imaging films in PACS.  She has no significant past medical history who presented to [facility] after last known normal being on 11 PM on 08/24/2019 when she went to bed.  She woke up the next morning with a headache and neck pain.  She had delivered a child in August 2020 and was still breast-feeding.  She presented with right-sided weakness, and confusion.  CT scan of the head done in the ER showed large left temporal intracerebral hemorrhage with extension into the ventricles.  She had a Glasgow Coma Scale of 12 and was initially able to speak but had slurred speech and right-sided weakness.  She was intubated for airway protection and stat neurosurgical consultation was obtained.  Her blood pressure was tightly controlled.  She was taken for emergent craniotomy for evacuation of the clot.  She also had ventriculostomy placed to drain her ventricles.  She had a prolonged stay in the ICU but did well and made gradual improvement.  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  This was subsequently confirmed on diagnostic cerebral catheter angiogram on 08/31/2019 by Dr. Kathyrn Sheriff who noticed a relatively large cortical vein over the left frontoparietal convexity emptying into the midportion of the superior sagittal sinus with paucity of ramifying veins emptying into this large cortical vein suggesting likelihood of cortical venous thrombosis involving vein of Trolard.  The large cerebral venous sinuses are  patent 2D echo showed normal ejection fraction.  Covid test was negative.  LDL was 124 mg percent.  Hemoglobin A1c was 4.7.  Urine drug screen was positive for marijuana.  Hypercoagulable panel labs were all negative.  Vasculitic labs were also negative patient obtained significant improvement  initially she was felt to be candidate for inpatient rehab but she improved rapidly and was discharged home with requirement for 24/7 supervision.  She states she is continued to do well.  Physically she is able to ambulate independently without assistance but she is still has significant memory and cognitive difficulties.  Speech is improved but she still had word hesitancy and occasional paraphasias.  She cannot multitask and has poor short-term memory.  She gets frustrated and agitated easily.  She recently saw Dr. Mikal Plane who discontinued Keppra which was given only for seizure prophylaxis for surgery and she did not have a clear documented seizure.  She is able to take care of her young child and attend to his needs.  She is living with her mother and asked somebody around her 24 hours.  She was kept in the ICU and blood pressure tightly controlled and monitored.  She was extubated.  She made gradual improvement  ROS:   14 system review of systems is positive for occasional speech difficulty and seizures and all other systems negative  PMH: No past medical history on file.  Social History:  Social History   Socioeconomic History  . Marital status: Single    Spouse name: Not on file  . Number of children: Not on file  . Years of education: Not on file  . Highest education level: Not on file  Occupational History  . Not on file  Tobacco Use  . Smoking status: Never Smoker  . Smokeless tobacco: Never Used  Substance and Sexual Activity  . Alcohol use: Not on file  . Drug use: Not on file  . Sexual activity: Not on file  Other Topics Concern  . Not on file  Social History Narrative  . Not on file   Social Determinants of Health   Financial Resource Strain:   . Difficulty of Paying Living Expenses:   Food Insecurity:   . Worried About Programme researcher, broadcasting/film/video in the Last Year:   . Barista in the Last Year:   Transportation Needs: No Transportation Needs  . Lack of  Transportation (Medical): No  . Lack of Transportation (Non-Medical): No  Physical Activity:   . Days of Exercise per Week:   . Minutes of Exercise per Session:   Stress:   . Feeling of Stress :   Social Connections:   . Frequency of Communication with Friends and Family:   . Frequency of Social Gatherings with Friends and Family:   . Attends Religious Services:   . Active Member of Clubs or Organizations:   . Attends Banker Meetings:   Marland Kitchen Marital Status:   Intimate Partner Violence:   . Fear of Current or Ex-Partner:   . Emotionally Abused:   Marland Kitchen Physically Abused:   . Sexually Abused:     Medications:   Current Outpatient Medications on File Prior to Visit  Medication Sig Dispense Refill  . aspirin EC 81 MG tablet Take 1 tablet (81 mg total) by mouth daily. 150 tablet 2   No current facility-administered medications on file prior to visit.    Allergies:   Allergies  Allergen Reactions  . Penicillins Itching  Vitals Today's Vitals   01/31/20 1235  BP: 102/68  Pulse: 92  Temp: 97.7 F (36.5 C)  Weight: 175 lb (79.4 kg)  Height: 5\' 9"  (1.753 m)   Body mass index is 25.84 kg/m.    Vitals Today's Vitals   01/31/20 1235  BP: 102/68  Pulse: 92  Temp: 97.7 F (36.5 C)  Weight: 175 lb (79.4 kg)  Height: 5\' 9"  (1.753 m)   Body mass index is 25.84 kg/m.  Physical Exam General: well developed, well nourished, pleasant middle-aged African-American lady, seated, in no evident distress Head: head normocephalic and atraumatic.  Neck: supple with no carotid or supraclavicular bruits Cardiovascular: regular rate and rhythm, no murmurs Musculoskeletal: no deformity.  Scar left temple from craniotomy. Skin:  no rash/petichiae Vascular:  Normal pulses all extremities  Neurologic Exam Mental Status: Awake and fully alert.  Unable to appreciate speech or language impairment during visit.  Oriented to place and time. Recent and remote memory intact.   Attention span, concentration and fund of knowledge diminished. Mood and affect appropriate.   Cranial Nerves: Pupils equal, briskly reactive to light. Extraocular movements full without nystagmus. Visual fields right eye right periphery loss, visual field full in the left eye. Hearing intact. Facial sensation intact. Face, tongue, palate moves normally and symmetrically.  Motor: Normal bulk and tone. Normal strength in all tested extremity muscles. Sensory.: intact to touch ,pinprick .position and vibratory sensation.  Coordination: Rapid alternating movements normal in all extremities. Finger-to-nose and heel-to-shin performed accurately bilaterally. Gait and Station: Arises from chair without difficulty. Stance is normal. Gait demonstrates normal stride length and balance . Able to heel, toe and tandem walk without difficulty.  Reflexes: 1+ and symmetric. Toes downgoing.      ASSESSMENT: 39 year old African-American lady with left and pleural parenchymal intracerebral hemorrhage as well as intraventricular hemorrhage in December 2020 requiring craniotomy as well as ventriculostomy of  unclear etiology.  Possibly vein of Trolard thrombosis but it occurred nearly 3 months postpartum making association with pregnancy doubtful.  Lab work for vasculitis and hypercoagulable panel were negative.  Plans on undergoing MRI and MRV on 02/02/2020.  She has been recovering very well from a stroke standpoint with minimal residual aphasia and decreased right periphery right eye.  Denies residual cognitive difficulties.  She does report recent seizure approximately 3 weeks ago while she was in [place], 02/04/2020 where she was evaluated at [facility].  She restarted Keppra 500 mg twice daily on her own as this had been previously discontinued by neurosurgery for seizure prophylaxis s/p craniotomy     PLAN: -Seizure activity likely late onset of ICH.  Continuation of Keppra 500 mg twice daily for seizure prophylaxis  -refill provided -Discussed no driving for 6 months post seizure activity per [place] law - will request hospital notes from recent admission regarding seizure activity as unable to view through epic -Ongoing participation in outpatient speech therapy with excellent improvement -Continue aspirin 81 mg daily for secondary stroke prevention -Undergo an MRI w/wo contrast and MRV on 02/02/2020 to assess for underlying etiology of ICH.  Extreme anxiety in regards to imaging.  Prior order placed for Xanax 0.5 mg per patient concerned that dosage would not be sufficient as she had previously been on Xanax.  Reviewed PMP.  Order adjusted for Xanax 1 mg tablet 1-2 tabs prior to MRI and may take additional tablet if needed.  Advised that she will need a driver to bring her to appointment -plans on mother bring her  Follow-up in 4 months or call earlier if needed   I spent 40 minutes of face-to-face and non-face-to-face time with patient.  This included previsit chart review, lab review, study review, order entry, electronic health record documentation, patient education regarding prior stroke, residual deficits, recent seizure activity and answered all questions to patient satisfaction   Ihor Austin, [facility]  [facility] [address] [place], [place] [address]  Phone [phone] Fax [phone] Note: This document was prepared with digital dictation and possible smart phrase technology. Any transcriptional errors that result from this process are unintentional.

## 2020-02-01 ENCOUNTER — Other Ambulatory Visit: Payer: Self-pay

## 2020-02-01 ENCOUNTER — Ambulatory Visit: Payer: Medicaid Other

## 2020-02-01 DIAGNOSIS — R4701 Aphasia: Secondary | ICD-10-CM

## 2020-02-01 NOTE — Progress Notes (Signed)
I agree with the above plan 

## 2020-02-01 NOTE — Therapy (Signed)
[facility] [address] [place], [place], [address] Phone: [phone]   Fax:  [phone]  Speech Language Pathology Treatment  Patient Details  Name: Amanda Small MRN: [id number] Date of Birth: [birth year] Referring Provider (SLP): Dr. Antony Contras   Encounter Date: 02/01/2020  End of Session - 02/01/20 1736    Visit Number  12    Number of Visits  20    Date for SLP Re-Evaluation  04/12/20    Authorization Type  medicaid starts 12/21/19, had private insurance prior to [employer] Will request more visits after initial 3 in April    Authorization Time Period  until 03-05-20    Authorization - Visit Number  2    Authorization - Number of Visits  10    SLP Start Time  1022    SLP Stop Time   1101    SLP Time Calculation (min)  39 min    Activity Tolerance  Patient tolerated treatment well       No past medical history on file.  Past Surgical History:  Procedure Laterality Date  . CRANIOTOMY Left 08/25/2019   Procedure: LEFT CRANIECTOMY;  Surgeon: [name], MD;  Location: [facility];  Service: Neurosurgery;  Laterality: Left;  . IR ANGIO INTRA EXTRACRAN SEL INTERNAL CAROTID BILAT MOD SED  08/31/2019  . IR ANGIO VERTEBRAL SEL VERTEBRAL UNI L MOD SED  08/31/2019  . VENTRICULOSTOMY Right 08/25/2019   Procedure: RIGHT FRONTAL VENTRICULAR CATHETER;  Surgeon: [name], MD;  Location: [facility];  Service: Neurosurgery;  Laterality: Right;    There were no vitals filed for this visit.  Subjective Assessment - 02/01/20 1023    Subjective  "You late, Glendell Docker. I'm sorry, my nerves is wore out. I have that -- thing-- MRI tomorrow."    Currently in Pain?  No/denies            ADULT SLP TREATMENT - 02/01/20 1028      General Information   Behavior/Cognition  Alert;Cooperative;Pleasant mood      Cognitive-Linquistic Treatment   Treatment focused on  Aphasia    Skilled Treatment  Pt arrived complaining about  nervousness about MRI tomorrow. SLP targeted specific word finding and use of compensations with mod complex-complex conversastion regarding pt's feelings about MRI tomorrow nad some relaxtion strategies she could attempt. Pt used phone for word finding for musician's names x2/4, with other notable anomic episodes in the 15 minute conversation functional due to 1-2 seconds extra time x6. SLP and pt then walked outside engaging in a 9-minute mod complex conversation without notable anomia.      Assessment / Recommendations / Plan   Plan  Continue with current plan of care      Progression Toward Goals   Progression toward goals  Progressing toward goals         SLP Short Term Goals - 01/29/20 1658      SLP SHORT TERM GOAL #1   Title  Pt will utilize compensatory strategies to read/comprehend 2 mildly complex sentence messges with occasional min A.    Baseline  1 simple sentence    Time  1    Period  Weeks    Status  Achieved      SLP SHORT TERM GOAL #2   Title  Pt will ID and correct errors in simple written sentence with occasional min A over 2 sessions    Baseline  Not able to correct written errors    Time  1    Period  Weeks    Status  Achieved      SLP SHORT TERM GOAL #3   Title  Pt will utilize compensatory strategies to comprehend mildly complex verbal questions/commands with occasional min A    Baseline  no strategies    Time  1    Period  Weeks    Status  Achieved       SLP Long Term Goals - 02/01/20 1739      SLP LONG TERM GOAL #1   Title  Pt will comprehend 4-5 senteces texts with occasional min A over 2 sessoins    Baseline  1 simple sentence    Time  4    Period  Weeks   all LTGs recerted on 01-29-20; or for 20 total sesions (for all LTGs)   Status  On-going      SLP LONG TERM GOAL #2   Title  Pt will text simple 2-3 sentece text, correcting errors with occasional min A over 2 sessions    Baseline  not correcting written errors    Time  4    Period  Weeks     Status  On-going      SLP LONG TERM GOAL #3   Title  Pt will utilize speech to text or other compensations to comprehend 2 paragraph emails with occasional min A over 2 sessions    Baseline  no compensations for email comprehension    Status  Deferred      SLP LONG TERM GOAL #4   Title  Pt will utilize compensations for aphasia to successfully participate in 8 minute business call (MD, insurance, pharmacy, work, etc) with occasional min A over 2 sessions    Baseline  no successful strategies for phone converations    Time  4    Period  Weeks    Status  On-going      SLP LONG TERM GOAL #5   Title  pt will demo verbal skills adequate for 15 minutes mod compelx/complex conversation in 3 sessions    Baseline  02-01-20    Time  4    Period  Weeks    Status  On-going       Plan - 02/01/20 1737    Clinical Impression Statement  Pt presented today with cont'd mild and improving expressive language deficits, however improvement in written expression  and reading comprehension (alexia) is slower. Pt continues functional/modified independent (extra time) in mod complex/complex conversation. I recommend cont'd skilled ST to maximize communication for independence, safety of her and her baby and comprehension of medical and insurance information.    Speech Therapy Frequency  2x / week   8 weeks or 17 visits   Treatment/Interventions  Language facilitation;Environmental controls;Cueing hierarchy;SLP instruction and feedback;Compensatory strategies;Functional tasks;Cognitive reorganization;Compensatory techniques;Internal/external aids;Multimodal communcation approach;Patient/family education    Potential to Achieve Goals  Good    Potential Considerations  Financial resources;Family/community support       Patient will benefit from skilled therapeutic intervention in order to improve the following deficits and impairments:   Aphasia    Problem List Patient Active Problem List   Diagnosis  Date Noted  . Intracranial venous  - L vein of Trolard 09/05/2019  . Hyperlipemia 09/05/2019  . Obesity 09/05/2019  . Marijuana use 09/05/2019  . Hypokalemia 09/05/2019  . 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 Obstructive hydrocephalus (HCC) 08/28/2019  . Aphasia due to acute stroke (HCC) 08/28/2019  . ICH (intracerebral hemorrhage) (  HCC)-L ICH w/ IVH s/p crani 08/25/2019    [facility] ,MS, CCC-SLP  02/01/2020, 5:40 PM  [facility] [address] [place], [place], [address] Phone: [phone]   Fax:  [phone]   Name: Amanda Small MRN: [id number] Date of Birth: 1981/08/09

## 2020-02-02 ENCOUNTER — Ambulatory Visit
Admission: RE | Admit: 2020-02-02 | Discharge: 2020-02-02 | Disposition: A | Discharge status: Home / Self Care | Payer: Medicaid Other | Source: Ambulatory Visit | Attending: Neurology | Admitting: Neurology

## 2020-02-02 DIAGNOSIS — G08 Intracranial and intraspinal phlebitis and thrombophlebitis: Secondary | ICD-10-CM

## 2020-02-02 DIAGNOSIS — I611 Nontraumatic intracerebral hemorrhage in hemisphere, cortical: Secondary | ICD-10-CM

## 2020-02-02 MED ORDER — GADOBENATE DIMEGLUMINE 529 MG/ML IV SOLN
15.0000 mL | Freq: Once | INTRAVENOUS | Status: AC | PRN
  Administered 2020-02-02: 15 mL via INTRAVENOUS

## 2020-02-05 ENCOUNTER — Ambulatory Visit: Payer: Medicaid Other | Admitting: Speech Pathology

## 2020-02-05 ENCOUNTER — Ambulatory Visit: Payer: 59 | Admitting: Neurology

## 2020-02-05 ENCOUNTER — Other Ambulatory Visit: Payer: Self-pay

## 2020-02-05 ENCOUNTER — Encounter: Payer: Self-pay | Admitting: Speech Pathology

## 2020-02-05 ENCOUNTER — Telehealth: Payer: Self-pay | Admitting: Adult Health

## 2020-02-05 DIAGNOSIS — R4701 Aphasia: Secondary | ICD-10-CM

## 2020-02-05 DIAGNOSIS — I69398 Other sequelae of cerebral infarction: Secondary | ICD-10-CM

## 2020-02-05 NOTE — Telephone Encounter (Signed)
I called pt and relayed that EEG ordered per JM/NP.  I explained the procedure (no needles) small metal electrodes attached to her head, lying on bed. (not like MRI),  Her mother explained to her as well. She was better after that.  I relayed they will call to schedule.  She verbalized understanding.

## 2020-02-05 NOTE — Telephone Encounter (Signed)
Received fax as requested regarding recent admission on 01/06/2020 regarding seizure activity at [facility].    Episode of seizure activity described as "shaking, unresponsive episode lasted approximately 2 to 3 minutes.  This resolved independently.  There was no fall, head injury or trauma associated with a seizure.  There is no tongue biting, loss of bladder with this episode.  On EMS arrival to the home patient was found to be postictal and confused.  No medications were given in route. "  "I do believe her symptoms were consistent with seizure today.  Likely secondary to noncompliance of medication.  Patient did receive a loading dose for Keppra here and tells me that she has a full prescription of Keppra 500 mg twice daily tablets at home.  Patient will resume taking her Keppra at home and will make a phone appointment with her physician this week."  Of note, documented "noncompliance" but she was advised to discontinue Keppra by neurosurgery in 09/2019 as this medication was used prophylactically and did not have any documented seizure activity.   Recent seizure activity likely late effect of prior stroke.  Per review of report, no EEG obtained during admission.  Will place order to obtain EEG.  She is also aware to continue Keppra 500 mg twice daily with refill provided at recent visit.

## 2020-02-05 NOTE — Telephone Encounter (Signed)
Pt has called Sandy, RN back.  Please call 

## 2020-02-05 NOTE — Therapy (Signed)
[facility] [address] [place], [place], [address] Phone: [phone]   Fax:  [phone]  Speech Language Pathology Treatment  Patient Details  Name: Amanda Small MRN: [id number] Date of Birth: 1980-10-19 Referring Provider (SLP): Dr. Delia Heady   Encounter Date: 02/05/2020  End of Session - 02/05/20 1355    Visit Number  13    Number of Visits  20    Authorization Time Period  until 03-05-20    Authorization - Visit Number  3    Authorization - Number of Visits  10    SLP Start Time  1315    SLP Stop Time   1355    SLP Time Calculation (min)  40 min       History reviewed. No pertinent past medical history.  Past Surgical History:  Procedure Laterality Date  . CRANIOTOMY Left 08/25/2019   Procedure: LEFT CRANIECTOMY;  Surgeon: Coletta Memos, MD;  Location: [facility] OR;  Service: Neurosurgery;  Laterality: Left;  . IR ANGIO INTRA EXTRACRAN SEL INTERNAL CAROTID BILAT MOD SED  08/31/2019  . IR ANGIO VERTEBRAL SEL VERTEBRAL UNI L MOD SED  08/31/2019  . VENTRICULOSTOMY Right 08/25/2019   Procedure: RIGHT FRONTAL VENTRICULAR CATHETER;  Surgeon: Coletta Memos, MD;  Location: [facility] OR;  Service: Neurosurgery;  Laterality: Right;    There were no vitals filed for this visit.  Subjective Assessment - 02/05/20 1324    Subjective  "I did my MRI"    Currently in Pain?  No/denies            ADULT SLP TREATMENT - 02/05/20 1324      General Information   Behavior/Cognition  Alert;Cooperative;Pleasant mood      Treatment Provided   Treatment provided  Cognitive-Linquistic      Cognitive-Linquistic Treatment   Treatment focused on  Aphasia;Patient/family/caregiver education    Skilled Treatment  Pt reports reading to her 40 year old successfully, missing 2-3 words in the book. Reading comprehension of 3-4 paragraph passage with strategy of spelling error word aloud and sounding out  She is aware of errors with  supervision cues and self corrects with extended time and usual min cues. In conversation, [name] utilized description as compensation for anomia with occasional questioning cues to elicit salient descriptions. She reports using this strategy successfully with friends and family.       Assessment / Recommendations / Plan   Plan  Continue with current plan of care       SLP Education - 02/05/20 1353    Education Details  continue daily reading practice    Person(s) Educated  Patient    Methods  Explanation;Verbal cues    Comprehension  Verbalized understanding       SLP Short Term Goals - 02/05/20 1353      SLP SHORT TERM GOAL #1   Title  Pt will utilize compensatory strategies to read/comprehend 2 mildly complex sentence messges with occasional min A.    Baseline  1 simple sentence    Time  1    Period  Weeks    Status  Achieved      SLP SHORT TERM GOAL #2   Title  Pt will ID and correct errors in simple written sentence with occasional min A over 2 sessions    Baseline  Not able to correct written errors    Time  1    Period  Weeks    Status  Achieved  SLP SHORT TERM GOAL #3   Title  Pt will utilize compensatory strategies to comprehend mildly complex verbal questions/commands with occasional min A    Baseline  no strategies    Time  1    Period  Weeks    Status  Achieved       SLP Long Term Goals - 02/05/20 1354      SLP LONG TERM GOAL #1   Title  Pt will comprehend 4-5 senteces texts with occasional min A over 2 sessoins    Baseline  1 simple sentence    Time  4    Period  Weeks   all LTGs recerted on 4-85-46; or for 20 total sesions (for all LTGs)   Status  Achieved      SLP LONG TERM GOAL #2   Title  Pt will text simple 2-3 sentece text, correcting errors with occasional min A over 2 sessions    Baseline  not correcting written errors    Time  3    Period  Weeks    Status  On-going      SLP LONG TERM GOAL #3   Title  Pt will utilize speech to text  or other compensations to comprehend 2 paragraph emails with occasional min A over 2 sessions    Baseline  no compensations for email comprehension    Status  Deferred      SLP LONG TERM GOAL #4   Title  Pt will utilize compensations for aphasia to successfully participate in 8 minute business call (MD, insurance, pharmacy, work, etc) with occasional min A over 2 sessions    Baseline  no successful strategies for phone converations    Time  3    Period  Weeks    Status  On-going      SLP LONG TERM GOAL #5   Title  pt will demo verbal skills adequate for 15 minutes mod compelx/complex conversation in 3 sessions    Baseline  02-01-20, 02-05-20    Time  3    Period  Weeks    Status  On-going       Plan - 02/05/20 1353    Clinical Impression Statement  Pt presented today with cont'd mild and improving expressive language deficits, however improvement in written expression  and reading comprehension (alexia) is slower. Pt continues functional/modified independent (extra time) in mod complex/complex conversation. I recommend cont'd skilled ST to maximize communication for independence, safety of her and her 40 baby and comprehension of medical and insurance information.    Speech Therapy Frequency  2x / week    Duration  --   8 weeks or 17 visits   Treatment/Interventions  Language facilitation;Environmental controls;Cueing hierarchy;SLP instruction and feedback;Compensatory strategies;Functional tasks;Cognitive reorganization;Compensatory techniques;Internal/external aids;Multimodal communcation approach;Patient/family education    Potential to Achieve Goals  Good       Patient will benefit from skilled therapeutic intervention in order to improve the following deficits and impairments:   Aphasia    Problem List Patient Active Problem List   Diagnosis Date Noted  . Intracranial venous  - L vein of [place] 09/05/2019  . Hyperlipemia 09/05/2019  . Obesity 09/05/2019  . Marijuana use  09/05/2019  . Hypokalemia 09/05/2019  . Cytotoxic brain edema ([place]) 08/28/2019  . Obstructive hydrocephalus ([place]) 08/28/2019  . Aphasia due to acute stroke ([place]) 08/28/2019  . ICH (intracerebral hemorrhage) (HCC)-L ICH w/ IVH s/p crani 08/25/2019    Gaius Ishaq, Annye Rusk  MS, CCC-SLP 02/05/2020, 1:56 PM  [facility] [address] [place], [place], [address] Phone: [phone]   Fax:  [phone]   Name: Amanda Small MRN: [id number] Date of Birth: 02-14-1981

## 2020-02-05 NOTE — Telephone Encounter (Signed)
I attempted to call pt and mailbox full, not able to LM.

## 2020-02-06 NOTE — Progress Notes (Signed)
Kindly inform the patient that MR venogram of the brain shows no significant blockages in the major venous sinuses in the brain.  MRI scan of the brain shows expected changes of shrinkage of her brain hemorrhage from December of last year with no new or unexpected findings.  Nothing to worry about

## 2020-02-07 ENCOUNTER — Other Ambulatory Visit: Payer: Self-pay

## 2020-02-07 ENCOUNTER — Ambulatory Visit: Payer: Medicaid Other | Admitting: Speech Pathology

## 2020-02-07 ENCOUNTER — Encounter: Payer: Self-pay | Admitting: Speech Pathology

## 2020-02-07 DIAGNOSIS — R4701 Aphasia: Secondary | ICD-10-CM | POA: Diagnosis not present

## 2020-02-07 NOTE — Patient Instructions (Signed)
   Could  Would  Couldn't  Wouldn't  Go on [employer] site. Practice reading some the the paragraphs about different services offered.   Great job practicing your reading every day!! It will continue to get better as you practice!

## 2020-02-07 NOTE — Therapy (Signed)
[facility] [address] [place], [place], [address] Phone: [phone]   Fax:  [phone]  Speech Language Pathology Treatment  Patient Details  Name: Amanda Small MRN: [id number] Date of Birth: 1981/07/01 Referring Provider (SLP): Dr. Delia Heady   Encounter Date: 02/07/2020  End of Session - 02/07/20 1404    Visit Number  14    Number of Visits  20    Date for SLP Re-Evaluation  04/12/20    Authorization Time Period  until 03-05-20    Authorization - Visit Number  4    Authorization - Number of Visits  10    SLP Start Time  1317    SLP Stop Time   1357    SLP Time Calculation (min)  40 min       History reviewed. No pertinent past medical history.  Past Surgical History:  Procedure Laterality Date  . CRANIOTOMY Left 08/25/2019   Procedure: LEFT CRANIECTOMY;  Surgeon: Coletta Memos, MD;  Location: [facility] OR;  Service: Neurosurgery;  Laterality: Left;  . IR ANGIO INTRA EXTRACRAN SEL INTERNAL CAROTID BILAT MOD SED  08/31/2019  . IR ANGIO VERTEBRAL SEL VERTEBRAL UNI L MOD SED  08/31/2019  . VENTRICULOSTOMY Right 08/25/2019   Procedure: RIGHT FRONTAL VENTRICULAR CATHETER;  Surgeon: Coletta Memos, MD;  Location: [facility] OR;  Service: Neurosurgery;  Laterality: Right;    There were no vitals filed for this visit.  Subjective Assessment - 02/07/20 1326    Subjective  "this is a book I've been trying to read to my son"    Currently in Pain?  No/denies            ADULT SLP TREATMENT - 02/07/20 1357      General Information   Behavior/Cognition  Alert;Cooperative;Pleasant mood      Treatment Provided   Treatment provided  Cognitive-Linquistic      Cognitive-Linquistic Treatment   Treatment focused on  Aphasia;Patient/family/caregiver education    Skilled Treatment  Targeted reading comprehension with book she reads to her son. Amanda Small required occasional min cues to ID errors when reading and to read certain  sight words (would, could) frequently. She benefitted from cues to use the illustrations to give her a clue about what the word may be, as well as to use context to predicts words. Educated [employer] services that even though her reading is improved for lower level books, she should still get assistance with medical, insurance, legal documents. Targeted reading on her former employers website with consistent cues and some mild frustration. Instructed her to practice reading on this site, as she hopes to return to work eventually. She is successfully texing her family and friends regularly      Assessment / Recommendations / Plan   Plan  Continue with current plan of care      Progression Toward Goals   Progression toward goals  Progressing toward goals         SLP Short Term Goals - 02/07/20 1403      SLP SHORT TERM GOAL #1   Title  Pt will utilize compensatory strategies to read/comprehend 2 mildly complex sentence messges with occasional min A.    Baseline  1 simple sentence    Time  1    Period  Weeks    Status  Achieved      SLP SHORT TERM GOAL #2   Title  Pt will ID and correct errors in simple written sentence with occasional min A over  2 sessions    Baseline  Not able to correct written errors    Time  1    Period  Weeks    Status  Achieved      SLP SHORT TERM GOAL #3   Title  Pt will utilize compensatory strategies to comprehend mildly complex verbal questions/commands with occasional min A    Baseline  no strategies    Time  1    Period  Weeks    Status  Achieved       SLP Long Term Goals - 02/07/20 1403      SLP LONG TERM GOAL #1   Title  Pt will comprehend 4-5 senteces texts with occasional min A over 2 sessoins    Baseline  1 simple sentence    Time  4    Period  Weeks   all LTGs recerted on 01-29-20; or for 20 total sesions (for all LTGs)   Status  Achieved      SLP LONG TERM GOAL #2   Title  Pt will text simple 2-3 sentece text, correcting errors with occasional min  A over 2 sessions    Baseline  not correcting written errors    Time  3    Period  Weeks    Status  Achieved      SLP LONG TERM GOAL #3   Title  Pt will utilize speech to text or other compensations to comprehend 2 paragraph emails with occasional min A over 2 sessions    Baseline  no compensations for email comprehension    Status  Deferred      SLP LONG TERM GOAL #4   Title  Pt will utilize compensations for aphasia to successfully participate in 8 minute business call (MD, insurance, pharmacy, work, etc) with occasional min A over 2 sessions    Baseline  no successful strategies for phone converations    Time  3    Period  Weeks    Status  On-going      SLP LONG TERM GOAL #5   Title  pt will demo verbal skills adequate for 15 minutes mod compelx/complex conversation in 3 sessions    Baseline  02-01-20, 02-05-20    Time  3    Period  Weeks    Status  On-going       Plan - 02/07/20 1403    Clinical Impression Statement  Pt presented today with cont'd mild and improving expressive language deficits, however improvement in written expression  and reading comprehension (alexia) is slower. Pt continues functional/modified independent (extra time) in mod complex/complex conversation. I recommend cont'd skilled ST to maximize communication for independence, safety of her and her baby and comprehension of medical and insurance information.    Speech Therapy Frequency  2x / week    Duration  --   8 weeks or 17 visits   Potential to Achieve Goals  Good       Patient will benefit from skilled therapeutic intervention in order to improve the following deficits and impairments:   Aphasia    Problem List Patient Active Problem List   Diagnosis Date Noted  . Intracranial venous  - L vein of [place] 09/05/2019  . Hyperlipemia 09/05/2019  . Obesity 09/05/2019  . Marijuana use 09/05/2019  . Hypokalemia 09/05/2019  . 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 Obstructive hydrocephalus  (HCC) 08/28/2019  . Aphasia due to acute stroke (HCC) 08/28/2019  . ICH (intracerebral hemorrhage) (HCC)-L ICH w/ IVH  s/p crani 08/25/2019    Tanor Glaspy, Annye Rusk MS, CCC-SLP 02/07/2020, 2:05 PM  [facility] [address] [place], [place], [address] Phone: [phone]   Fax:  [phone]   Name: Amanda Small MRN: [id number] Date of Birth: 1980/11/07

## 2020-02-12 ENCOUNTER — Encounter: Payer: Self-pay | Admitting: Speech Pathology

## 2020-02-12 ENCOUNTER — Ambulatory Visit: Payer: Medicaid Other | Admitting: Speech Pathology

## 2020-02-12 ENCOUNTER — Other Ambulatory Visit: Payer: Self-pay

## 2020-02-12 DIAGNOSIS — R4701 Aphasia: Secondary | ICD-10-CM

## 2020-02-12 NOTE — Therapy (Signed)
[facility] [address] [place], [place], [address] Phone: [phone]   Fax:  [phone]  Speech Language Pathology Treatment  Patient Details  Name: Amanda Small MRN: [id number] Date of Birth: 12-20-1980 Referring Provider (SLP): Dr. Delia Heady   Encounter Date: 02/12/2020  End of Session - 02/12/20 1502    Visit Number  15    Number of Visits  20    Date for SLP Re-Evaluation  04/12/20    Authorization Type  medicaid starts 12/21/19, had private insurance prior to [employer]. Will request more visits after initial 3 in April    Authorization Time Period  until 03-05-20    Authorization - Visit Number  5    Authorization - Number of Visits  10    SLP Start Time  1316    SLP Stop Time   1355    SLP Time Calculation (min)  39 min    Activity Tolerance  Patient tolerated treatment well       History reviewed. No pertinent past medical history.  Past Surgical History:  Procedure Laterality Date  . CRANIOTOMY Left 08/25/2019   Procedure: LEFT CRANIECTOMY;  Surgeon: Coletta Memos, MD;  Location: [facility] OR;  Service: Neurosurgery;  Laterality: Left;  . IR ANGIO INTRA EXTRACRAN SEL INTERNAL CAROTID BILAT MOD SED  08/31/2019  . IR ANGIO VERTEBRAL SEL VERTEBRAL UNI L MOD SED  08/31/2019  . VENTRICULOSTOMY Right 08/25/2019   Procedure: RIGHT FRONTAL VENTRICULAR CATHETER;  Surgeon: Coletta Memos, MD;  Location: [facility] OR;  Service: Neurosurgery;  Laterality: Right;    There were no vitals filed for this visit.  Subjective Assessment - 02/12/20 1327    Subjective  "I am tired"    Currently in Pain?  No/denies            ADULT SLP TREATMENT - 02/12/20 1328      General Information   Behavior/Cognition  Alert;Cooperative;Pleasant mood      Cognitive-Linquistic Treatment   Treatment focused on  Aphasia;Patient/family/caregiver education    Skilled Treatment  Reading comprension of 3-4h grade multiple paragraph  passages with extended time and cues to use predicting, sounding out and spelling aloud to A her in reading word correctly. Reading continues to increase in speed and accuracy at 3-4th grade level. Higher level reading continues to result in frustration and frequent mod to max A      Assessment / Recommendations / Plan   Plan  Continue with current plan of care      Progression Toward Goals   Progression toward goals  Progressing toward goals         SLP Short Term Goals - 02/12/20 1502      SLP SHORT TERM GOAL #1   Title  Pt will utilize compensatory strategies to read/comprehend 2 mildly complex sentence messges with occasional min A.    Baseline  1 simple sentence    Time  1    Period  Weeks    Status  Achieved      SLP SHORT TERM GOAL #2   Title  Pt will ID and correct errors in simple written sentence with occasional min A over 2 sessions    Baseline  Not able to correct written errors    Time  1    Period  Weeks    Status  Achieved      SLP SHORT TERM GOAL #3   Title  Pt will utilize compensatory strategies to comprehend mildly  complex verbal questions/commands with occasional min A    Baseline  no strategies    Time  1    Period  Weeks    Status  Achieved       SLP Long Term Goals - 02/12/20 1502      SLP LONG TERM GOAL #1   Title  Pt will comprehend 4-5 senteces texts with occasional min A over 2 sessoins    Baseline  1 simple sentence    Time  4    Period  Weeks   all LTGs recerted on 01-29-20; or for 20 total sesions (for all LTGs)   Status  Achieved      SLP LONG TERM GOAL #2   Title  Pt will text simple 2-3 sentece text, correcting errors with occasional min A over 2 sessions    Baseline  not correcting written errors    Time  3    Period  Weeks    Status  Achieved      SLP LONG TERM GOAL #3   Title  Pt will utilize speech to text or other compensations to comprehend 2 paragraph emails with occasional min A over 2 sessions    Baseline  no  compensations for email comprehension    Status  Deferred      SLP LONG TERM GOAL #4   Title  Pt will utilize compensations for aphasia to successfully participate in 8 minute business call (MD, insurance, pharmacy, work, etc) with occasional min A over 2 sessions    Baseline  no successful strategies for phone converations    Time  3    Period  Weeks    Status  On-going      SLP LONG TERM GOAL #5   Title  pt will demo verbal skills adequate for 15 minutes mod compelx/complex conversation in 3 sessions    Baseline  02-01-20, 02-05-20    Time  3    Period  Weeks    Status  On-going       Plan - 02/12/20 1459    Clinical Impression Statement  Ongoing improvement in speed, accuracy and comprehension of simple written paragraphs, approx 3rd to 4th grade level. She continues to require mod to max A for higher level reading resulting in frustration. She uses compensations for aphasia in verbal expression successfully. Continue skilled ST to [facility] communication for possible return to work    Speech Therapy Frequency  2x / week    Duration  --   8 weeks or 17 visits   Treatment/Interventions  Language facilitation;Environmental controls;Cueing hierarchy;SLP instruction and feedback;Compensatory strategies;Functional tasks;Cognitive reorganization;Compensatory techniques;Internal/external aids;Multimodal communcation approach;Patient/family education    Potential to Achieve Goals  Good       Patient will benefit from skilled therapeutic intervention in order to improve the following deficits and impairments:   Aphasia    Problem List Patient Active Problem List   Diagnosis Date Noted  . Intracranial venous  - L vein of [place] 09/05/2019  . Hyperlipemia 09/05/2019  . Obesity 09/05/2019  . Marijuana use 09/05/2019  . Hypokalemia 09/05/2019  . 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 Obstructive hydrocephalus (HCC) 08/28/2019  . Aphasia due to acute stroke (HCC) 08/28/2019  . ICH  (intracerebral hemorrhage) (HCC)-L ICH w/ IVH s/p crani 08/25/2019    Kateryna Grantham, Radene Journey MS, CCC-SLP 02/12/2020, 3:03 PM  [facility] [address] [place], [place], [address] Phone: [phone]   Fax:  [phone]  Name: Amanda Small MRN: [id number] Date of Birth: 1980-11-12

## 2020-02-14 ENCOUNTER — Encounter: Payer: Self-pay | Admitting: Speech Pathology

## 2020-02-14 ENCOUNTER — Other Ambulatory Visit: Payer: Self-pay

## 2020-02-14 ENCOUNTER — Ambulatory Visit: Payer: Medicaid Other | Admitting: Speech Pathology

## 2020-02-14 DIAGNOSIS — R4701 Aphasia: Secondary | ICD-10-CM

## 2020-02-14 NOTE — Therapy (Signed)
[facility] [address] [place], [place], [address] Phone: [phone]   Fax:  [phone]  Speech Language Pathology Treatment  Patient Details  Name: Amanda Small MRN: [id number] Date of Birth: April 14, 1981 Referring Provider (SLP): Dr. Delia Heady   Encounter Date: 02/14/2020  End of Session - 02/14/20 1244    Visit Number  16    Number of Visits  20    Authorization Type  medicaid starts 12/21/19, had private insurance prior to [facility]. Will request more visits after initial 3 in April    Authorization Time Period  until 03-05-20    Authorization - Visit Number  6    Authorization - Number of Visits  10    SLP Start Time  1147    SLP Stop Time   1229    SLP Time Calculation (min)  42 min    Activity Tolerance  Patient tolerated treatment well       History reviewed. No pertinent past medical history.  Past Surgical History:  Procedure Laterality Date  . CRANIOTOMY Left 08/25/2019   Procedure: LEFT CRANIECTOMY;  Surgeon: Coletta Memos, MD;  [facility] OR;  Service: Neurosurgery;  Laterality: Left;  . IR ANGIO INTRA EXTRACRAN SEL INTERNAL CAROTID BILAT MOD SED  08/31/2019  . IR ANGIO VERTEBRAL SEL VERTEBRAL UNI L MOD SED  08/31/2019  . VENTRICULOSTOMY Right 08/25/2019   Procedure: RIGHT FRONTAL VENTRICULAR CATHETER;  Surgeon: Coletta Memos, MD;  Location: [facility] OR;  Service: Neurosurgery;  Laterality: Right;    There were no vitals filed for this visit.  Subjective Assessment - 02/14/20 1232    Subjective  "I have been reading a new book to my baby but its an easy one"    Currently in Pain?  No/denies            ADULT SLP TREATMENT - 02/14/20 1233      General Information   Behavior/Cognition  Alert;Cooperative;Pleasant mood      Cognitive-Linquistic Treatment   Treatment focused on  Aphasia;Patient/family/caregiver education    Skilled Treatment  Reading comprehension of Men [employer] Wom[employer]  basketball schedule and ticket options/prices. Jilliane read questions with minimal extended time and occasional min A. She required frequent verbal and visual cues to comprehend the questions and locate the information on the schedule. Occasional min A for money math problem solving. Reading schedule and times for various pools and programs. Gessica perseverated on money, reading time as money, requiring visual and verbal cues and resulted in frustration. Agreed to try this again next session      Assessment / Recommendations / Plan   Plan  Continue with current plan of care      Progression Toward Goals   Progression toward goals  Progressing toward goals         SLP Short Term Goals - 02/14/20 1243      SLP SHORT TERM GOAL #1   Title  Pt will utilize compensatory strategies to read/comprehend 2 mildly complex sentence messges with occasional min A.    Baseline  1 simple sentence    Time  1    Period  Weeks    Status  Achieved      SLP SHORT TERM GOAL #2   Title  Pt will ID and correct errors in simple written sentence with occasional min A over 2 sessions    Baseline  Not able to correct written errors    Time  1    Period  Weeks    Status  Achieved      SLP SHORT TERM GOAL #3   Title  Pt will utilize compensatory strategies to comprehend mildly complex verbal questions/commands with occasional min A    Baseline  no strategies    Time  1    Period  Weeks    Status  Achieved       SLP Long Term Goals - 02/14/20 1244      SLP LONG TERM GOAL #1   Title  Pt will comprehend 4-5 senteces texts with occasional min A over 2 sessoins    Baseline  1 simple sentence    Time  4    Period  Weeks   all LTGs recerted on 03-16-93; or for 20 total sesions (for all LTGs)   Status  Achieved      SLP LONG TERM GOAL #2   Title  Pt will text simple 2-3 sentece text, correcting errors with occasional min A over 2 sessions    Baseline  not correcting written errors    Time  3    Period   Weeks    Status  Achieved      SLP LONG TERM GOAL #3   Title  Pt will utilize speech to text or other compensations to comprehend 2 paragraph emails with occasional min A over 2 sessions    Baseline  no compensations for email comprehension    Status  Deferred      SLP LONG TERM GOAL #4   Title  Pt will utilize compensations for aphasia to successfully participate in 8 minute business call (MD, insurance, pharmacy, work, etc) with occasional min A over 2 sessions    Baseline  no successful strategies for phone converations    Time  3    Period  Weeks    Status  On-going      SLP LONG TERM GOAL #5   Title  pt will demo verbal skills adequate for 15 minutes mod compelx/complex conversation in 3 sessions    Baseline  02-01-20, 02-05-20    Time  3    Period  Weeks    Status  On-going       Plan - 02/14/20 1243    Clinical Impression Statement  Ongoing improvement in speed, accuracy and comprehension of simple written paragraphs, approx 3rd to 4th grade level. She continues to require mod to max A for higher level reading resulting in frustration. She uses compensations for aphasia in verbal expression successfully. Continue skilled ST to [facility] communication for possible return to work    Speech Therapy Frequency  2x / week    Duration  --   8 weeks or 17 visits   Treatment/Interventions  Language facilitation;Environmental controls;Cueing hierarchy;SLP instruction and feedback;Compensatory strategies;Functional tasks;Cognitive reorganization;Compensatory techniques;Internal/external aids;Multimodal communcation approach;Patient/family education    Potential to Achieve Goals  Good       Patient will benefit from skilled therapeutic intervention in order to improve the following deficits and impairments:   Aphasia    Problem List Patient Active Problem List   Diagnosis Date Noted  . Intracranial venous  - L vein of [place] 09/05/2019  . Hyperlipemia 09/05/2019  . Obesity  09/05/2019  . Marijuana use 09/05/2019  . Hypokalemia 09/05/2019  . Cytotoxic brain edema ([place]) 08/28/2019  . Obstructive hydrocephalus ([place]) 08/28/2019  . Aphasia due to acute stroke ([place]) 08/28/2019  . ICH (intracerebral hemorrhage) (HCC)-L ICH w/ IVH s/p crani 08/25/2019    Andras Grunewald, Mickel Baas  Ann MS, CCC-SLP 02/14/2020, 12:45 PM   [facility] [address] [place], [place], [address] Phone: [phone]   Fax:  [phone]   Name: DALIA JOLLIE MRN: [id number] Date of Birth: [birth year]

## 2020-02-20 ENCOUNTER — Telehealth: Payer: Self-pay

## 2020-02-20 NOTE — Telephone Encounter (Signed)
I called patient about her MRI brain and MR head results. I stated the MR venogram of the brain shows no significant blockages in the major venous sinuses in the brain. MRI scan of the brain shows expected changes of shrinkage of her brain hemorrhage from December of last year with no new or unexpected findings. Nothing to worry about.Pt verbalized understanding.

## 2020-02-20 NOTE — Telephone Encounter (Signed)
-----   Message from Micki Riley, MD sent at 02/06/2020 11:03 AM EDT ----- Amanda Small inform the patient that MR venogram of the brain shows no significant blockages in the major venous sinuses in the brain.  MRI scan of the brain shows expected changes of shrinkage of her brain hemorrhage from December of last year with no new or unexpected findings.  Nothing to worry about

## 2020-02-21 ENCOUNTER — Encounter: Payer: Self-pay | Admitting: Speech Pathology

## 2020-02-21 ENCOUNTER — Other Ambulatory Visit: Payer: Self-pay

## 2020-02-21 ENCOUNTER — Ambulatory Visit: Payer: Medicaid Other | Attending: Neurology | Admitting: Speech Pathology

## 2020-02-21 DIAGNOSIS — R4701 Aphasia: Secondary | ICD-10-CM | POA: Diagnosis present

## 2020-02-21 NOTE — Therapy (Signed)
[facility] [address] [place], [place], [address] Phone: [phone]   Fax:  [phone]  Speech Language Pathology Treatment  Patient Details  Name: Amanda Small MRN: [id number] Date of Birth: Dec 25, 1980 Referring Provider (SLP): Dr. Delia Heady   Encounter Date: 02/21/2020  End of Session - 02/21/20 1251    Visit Number  17    Number of Visits  20    Date for SLP Re-Evaluation  04/12/20    Authorization Time Period  until 03-05-20    Authorization - Visit Number  7    Authorization - Number of Visits  10    SLP Start Time  1146    SLP Stop Time   1232    SLP Time Calculation (min)  46 min    Activity Tolerance  Patient tolerated treatment well       History reviewed. No pertinent past medical history.  Past Surgical History:  Procedure Laterality Date  . CRANIOTOMY Left 08/25/2019   Procedure: LEFT CRANIECTOMY;  Surgeon: Coletta Memos, MD;  Location: [facility] OR;  Service: Neurosurgery;  Laterality: Left;  . IR ANGIO INTRA EXTRACRAN SEL INTERNAL CAROTID BILAT MOD SED  08/31/2019  . IR ANGIO VERTEBRAL SEL VERTEBRAL UNI L MOD SED  08/31/2019  . VENTRICULOSTOMY Right 08/25/2019   Procedure: RIGHT FRONTAL VENTRICULAR CATHETER;  Surgeon: Coletta Memos, MD;  Location: [facility] OR;  Service: Neurosurgery;  Laterality: Right;    There were no vitals filed for this visit.  Subjective Assessment - 02/21/20 1209    Subjective  "I programmed my new TV all by my self"    Currently in Pain?  No/denies            ADULT SLP TREATMENT - 02/21/20 1211      General Information   Behavior/Cognition  Alert;Cooperative;Pleasant mood      Treatment Provided   Treatment provided  Cognitive-Linquistic      Cognitive-Linquistic Treatment   Treatment focused on  Aphasia;Patient/family/caregiver education    Skilled Treatment  Reading comprehension targeted with pool schedules and word problems math, and reading deli menu and  completing money word problems with extended time frequently and occasional min A for comprehension of word problems. Overall, Amanda Small is reading and comprehending quicker each session.       Assessment / Recommendations / Plan   Plan  Continue with current plan of care      Progression Toward Goals   Progression toward goals  Progressing toward goals         SLP Short Term Goals - 02/21/20 1250      SLP SHORT TERM GOAL #1   Title  Pt will utilize compensatory strategies to read/comprehend 2 mildly complex sentence messges with occasional min A.    Baseline  1 simple sentence    Time  1    Period  Weeks    Status  Achieved      SLP SHORT TERM GOAL #2   Title  Pt will ID and correct errors in simple written sentence with occasional min A over 2 sessions    Baseline  Not able to correct written errors    Time  1    Period  Weeks    Status  Achieved      SLP SHORT TERM GOAL #3   Title  Pt will utilize compensatory strategies to comprehend mildly complex verbal questions/commands with occasional min A    Baseline  no strategies  Time  1    Period  Weeks    Status  Achieved       SLP Long Term Goals - 02/21/20 1250      SLP LONG TERM GOAL #1   Title  Pt will comprehend 4-5 senteces texts with occasional min A over 2 sessoins    Baseline  1 simple sentence    Time  4    Period  Weeks   all LTGs recerted on 05-21-50; or for 20 total sesions (for all LTGs)   Status  Achieved      SLP LONG TERM GOAL #2   Title  Pt will text simple 2-3 sentece text, correcting errors with occasional min A over 2 sessions    Baseline  not correcting written errors    Time  3    Period  Weeks    Status  Achieved      SLP LONG TERM GOAL #3   Title  Pt will utilize speech to text or other compensations to comprehend 2 paragraph emails with occasional min A over 2 sessions    Baseline  no compensations for email comprehension    Status  Deferred      SLP LONG TERM GOAL #4   Title  Pt  will utilize compensations for aphasia to successfully participate in 8 minute business call (MD, insurance, pharmacy, work, etc) with occasional min A over 2 sessions    Baseline  no successful strategies for phone converations    Time  3    Period  Weeks    Status  On-going      SLP LONG TERM GOAL #5   Title  pt will demo verbal skills adequate for 15 minutes mod compelx/complex conversation in 3 sessions    Baseline  02-01-20, 02-05-20    Time  3    Period  Weeks    Status  Achieved       Plan - 02/21/20 1249    Clinical Impression Statement  Ongoing improvement in speed, accuracy and comprehension of simple written paragraphs, approx 3rd to 4th grade level. She continues to require mod to max A for higher level reading resulting in frustration. She uses compensations for aphasia in verbal expression successfully. Continue skilled ST to [facility] communication for possible return to work    Speech Therapy Frequency  2x / week    Duration  --   8 weeks or 17 visits   Treatment/Interventions  Language facilitation;Environmental controls;Cueing hierarchy;SLP instruction and feedback;Compensatory strategies;Functional tasks;Cognitive reorganization;Compensatory techniques;Internal/external aids;Multimodal communcation approach;Patient/family education    Potential to Achieve Goals  Good       Patient will benefit from skilled therapeutic intervention in order to improve the following deficits and impairments:   Aphasia    Problem List Patient Active Problem List   Diagnosis Date Noted  . Intracranial venous  - L vein of [place] 09/05/2019  . Hyperlipemia 09/05/2019  . Obesity 09/05/2019  . Marijuana use 09/05/2019  . Hypokalemia 09/05/2019  . Cytotoxic brain edema ([place]) 08/28/2019  . Obstructive hydrocephalus ([place]) 08/28/2019  . Aphasia due to acute stroke ([place]) 08/28/2019  . ICH (intracerebral hemorrhage) (HCC)-L ICH w/ IVH s/p crani 08/25/2019    Amanda Small, Annye Rusk MS,  CCC-SLP 02/21/2020, 12:53 PM  [facility] [address] [place], [place], [address] Phone: [phone]   Fax:  [phone]   Name: Amanda Small MRN: [id number] Date of Birth: [birth year]

## 2020-02-26 ENCOUNTER — Encounter: Payer: Self-pay | Admitting: Speech Pathology

## 2020-02-26 ENCOUNTER — Ambulatory Visit: Payer: Medicaid Other | Admitting: Speech Pathology

## 2020-02-26 ENCOUNTER — Other Ambulatory Visit: Payer: Self-pay

## 2020-02-26 DIAGNOSIS — R4701 Aphasia: Secondary | ICD-10-CM | POA: Diagnosis not present

## 2020-02-26 NOTE — Therapy (Signed)
[facility] [address] [place], [place], [address] Phone: [phone]   Fax:  [phone]  Speech Language Pathology Treatment  Patient Details  Name: Amanda Small MRN: [id number] Date of Birth: [birth year] Referring Provider (SLP): Dr. Delia Heady   Encounter Date: 02/26/2020  End of Session - 02/26/20 1315    Visit Number  18    Number of Visits  20    Date for SLP Re-Evaluation  04/12/20    Authorization - Visit Number  8    Authorization - Number of Visits  10    SLP Start Time  1228    SLP Stop Time   1310    SLP Time Calculation (min)  42 min    Activity Tolerance  Patient tolerated treatment well       History reviewed. No pertinent past medical history.  Past Surgical History:  Procedure Laterality Date  . CRANIOTOMY Left 08/25/2019   Procedure: LEFT CRANIECTOMY;  Surgeon: Coletta Memos, MD;  Location: [facility] OR;  Service: Neurosurgery;  Laterality: Left;  . IR ANGIO INTRA EXTRACRAN SEL INTERNAL CAROTID BILAT MOD SED  08/31/2019  . IR ANGIO VERTEBRAL SEL VERTEBRAL UNI L MOD SED  08/31/2019  . VENTRICULOSTOMY Right 08/25/2019   Procedure: RIGHT FRONTAL VENTRICULAR CATHETER;  Surgeon: Coletta Memos, MD;  Location: [facility] OR;  Service: Neurosurgery;  Laterality: Right;    There were no vitals filed for this visit.  Subjective Assessment - 02/26/20 1310    Subjective  "I got something from unemployment, but I don't understand it"    Currently in Pain?  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 She required extended time and frequent  verbal cues to comprehend information required on form. In 20 minute conversation, Mariza utilized compensations for aphasia with mod I. She utilizes gestures, descriptions and releated words effectively to communicate in mildly complex conversation. 1 request for clarification required.       Assessment / Recommendations / Plan   Plan  Continue with current plan of care      Progression Toward Goals   Progression toward goals  Progressing toward goals       SLP Education - 02/26/20 1313    Education Details  get help if you can with disability and unemployment forms    Person(s) Educated  Patient    Methods  Explanation;Verbal cues    Comprehension  Verbalized understanding;Verbal cues required;Returned demonstration;Need further instruction       SLP Short Term Goals - 02/26/20 1314      SLP SHORT TERM GOAL #1   Title  Pt will utilize compensatory strategies to read/comprehend 2 mildly complex sentence messges with occasional min A.    Baseline  1 simple sentence    Time  1    Period  Weeks    Status  Achieved      SLP SHORT TERM GOAL #2   Title  Pt will ID and correct errors in simple written sentence with occasional min A over 2 sessions    Baseline  Not able to correct written errors  Time  1    Period  Weeks    Status  Achieved      SLP SHORT TERM GOAL #3   Title  Pt will utilize compensatory strategies to comprehend mildly complex verbal questions/commands with occasional min A    Baseline  no strategies    Time  1    Period  Weeks    Status  Achieved       SLP Long Term Goals - 02/26/20 1314      SLP LONG TERM GOAL #1   Title  Pt will comprehend 4-5 senteces texts with occasional min A over 2 sessoins    Baseline  1 simple sentence    Time  4    Period  Weeks   all LTGs recerted on 5-95-63; or for 20 total sesions (for all LTGs)   Status  Achieved      SLP LONG TERM GOAL #2   Title  Pt will text simple 2-3 sentece text, correcting errors with  occasional min A over 2 sessions    Baseline  not correcting written errors    Time  3    Period  Weeks    Status  Achieved      SLP LONG TERM GOAL #3   Title  Pt will utilize compensations to comprehend 2 paragraph emails or passages with occasional min A over 2 sessions    Baseline  no compensations for email comprehension    Status  Revised      SLP LONG TERM GOAL #4   Title  Pt will utilize compensations for aphasia to successfully participate in 8 minute business call (MD, insurance, pharmacy, work, etc) with occasional min A over 2 sessions    Baseline  no successful strategies for phone converations    Time  3    Period  Weeks    Status  Achieved      SLP LONG TERM GOAL #5   Title  pt will demo verbal skills adequate for 15 minutes mod compelx/complex conversation in 3 sessions    Baseline  02-01-20, 02-05-20    Time  3    Period  Weeks    Status  Achieved       Plan - 02/26/20 1313    Clinical Impression Statement  Ongoing improvement in speed, accuracy and comprehension of simple written paragraphs, approx 3rd to 4th grade level. She continues to require mod to max A for higher level reading resulting in frustration. She uses compensations for aphasia in verbal expression successfully. Continue skilled ST to [facility] communication for possible return to work    Speech Therapy Frequency  2x / week    Duration  --   10 weeks or 20 visits   Treatment/Interventions  Language facilitation;Environmental controls;Cueing hierarchy;SLP instruction and feedback;Compensatory strategies;Functional tasks;Cognitive reorganization;Compensatory techniques;Internal/external aids;Multimodal communcation approach;Patient/family education    Potential to Achieve Goals  Good       Patient will benefit from skilled therapeutic intervention in order to improve the following deficits and impairments:   Aphasia    Problem List Patient Active Problem List   Diagnosis Date Noted  .  Intracranial venous  - L vein of [place] 09/05/2019  . Hyperlipemia 09/05/2019  . Obesity 09/05/2019  . Marijuana use 09/05/2019  . Hypokalemia 09/05/2019  . Cytotoxic brain edema ([place]) 08/28/2019  . Obstructive hydrocephalus ([place]) 08/28/2019  . Aphasia due to acute stroke ([place]) 08/28/2019  . ICH (intracerebral hemorrhage) (HCC)-L ICH w/ IVH s/p crani 08/25/2019  Eryck Negron, Radene Journey MS, CCC-SLP 02/26/2020, 2:04 PM  [facility] [address] [place], [place], [address] Phone: [phone]   Fax:  [phone]   Name: Amanda Small MRN: [id number] Date of Birth: 1980/12/05

## 2020-02-28 ENCOUNTER — Ambulatory Visit: Payer: Medicaid Other

## 2020-02-28 ENCOUNTER — Other Ambulatory Visit: Payer: Self-pay

## 2020-02-28 DIAGNOSIS — R4701 Aphasia: Secondary | ICD-10-CM | POA: Diagnosis not present

## 2020-02-28 NOTE — Therapy (Signed)
[facility] [address] [place], [place], [address] Phone: [phone]   Fax:  [phone]  Speech Language Pathology Treatment  Patient Details  Name: Amanda Small MRN: [id number] Date of Birth: 11/18/1980 Referring Provider (SLP): Dr. Antony Contras   Encounter Date: 02/28/2020  End of Session - 02/28/20 1356    Visit Number  19    Number of Visits  20    Date for SLP Re-Evaluation  04/12/20    Authorization - Visit Number  9    Authorization - Number of Visits  10    SLP Start Time  0737   pt 5 minutes late   SLP Stop Time   1062    SLP Time Calculation (min)  28 min    Activity Tolerance  Patient tolerated treatment well       History reviewed. No pertinent past medical history.  Past Surgical History:  Procedure Laterality Date  . CRANIOTOMY Left 08/25/2019   Procedure: LEFT CRANIECTOMY;  Surgeon: [name], MD;  Location: [facility];  Service: Neurosurgery;  Laterality: Left;  . IR ANGIO INTRA EXTRACRAN SEL INTERNAL CAROTID BILAT MOD SED  08/31/2019  . IR ANGIO VERTEBRAL SEL VERTEBRAL UNI L MOD SED  08/31/2019  . VENTRICULOSTOMY Right 08/25/2019   Procedure: RIGHT FRONTAL VENTRICULAR CATHETER;  Surgeon: [name], MD;  Location: [facility];  Service: Neurosurgery;  Laterality: Right;    There were no vitals filed for this visit.  Subjective Assessment - 02/28/20 1348    Subjective  Pt did not bring social security papers with her.    Currently in Pain?  No/denies            ADULT SLP TREATMENT - 02/28/20 1350      General Information   Behavior/Cognition  Alert;Cooperative;Pleasant mood      Treatment Provided   Treatment provided  Cognitive-Linquistic      Cognitive-Linquistic Treatment   Treatment focused on  Aphasia    Skilled Treatment  Pt found what to expect in your 64 month old "Here's what I want to read with yall today." Pt required extended time and occasional verbal cues to  comprehend information. At times pt extrapolated incorrectly the text meaning unknowingly. SLP strongly suggested pt activate the text to speech function on her phone to review her comprehension of the written message. She agreed with this. In a 15 minute conversation, pt successfully utilized compensations for aphasia (gestures, descriptions and releated words) to communicate  mild-moderately complex information. SLP asaked for one clarification during this time.      Assessment / Recommendations / Plan   Plan  Continue with current plan of care      Progression Toward Goals   Progression toward goals  Progressing toward goals       SLP Education - 02/28/20 1356    Education Details  text to speech function is crucial to verify understanding of written info on your phone    Person(s) Educated  Patient    Methods  Explanation    Comprehension  Verbalized understanding       SLP Short Term Goals - 02/26/20 1314      SLP SHORT TERM GOAL #1   Title  Pt will utilize compensatory strategies to read/comprehend 2 mildly complex sentence messges with occasional min A.    Baseline  1 simple sentence    Time  1    Period  Weeks    Status  Achieved  SLP SHORT TERM GOAL #2   Title  Pt will ID and correct errors in simple written sentence with occasional min A over 2 sessions    Baseline  Not able to correct written errors    Time  1    Period  Weeks    Status  Achieved      SLP SHORT TERM GOAL #3   Title  Pt will utilize compensatory strategies to comprehend mildly complex verbal questions/commands with occasional min A    Baseline  no strategies    Time  1    Period  Weeks    Status  Achieved       SLP Long Term Goals - 02/28/20 1358      SLP LONG TERM GOAL #1   Title  Pt will comprehend 4-5 senteces texts with occasional min A over 2 sessoins    Baseline  1 simple sentence    Period  --   all LTGs recerted on 01-29-20; or for 20 total sesions (for all LTGs)   Status   Achieved      SLP LONG TERM GOAL #2   Title  Pt will text simple 2-3 sentece text, correcting errors with occasional min A over 2 sessions    Baseline  not correcting written errors    Status  Achieved      SLP LONG TERM GOAL #3   Title  Pt will utilize compensations to comprehend 2 paragraph emails or passages with occasional min A over 2 sessions    Baseline  no compensations for email comprehension    Status  Revised      SLP LONG TERM GOAL #4   Title  Pt will utilize compensations for aphasia to successfully participate in 8 minute business call (MD, insurance, pharmacy, work, etc) with occasional min A over 2 sessions    Baseline  no successful strategies for phone converations    Status  Achieved      SLP LONG TERM GOAL #5   Title  pt will demo verbal skills adequate for 15 minutes mod compelx/complex conversation in 3 sessions    Baseline  02-01-20, 02-05-20    Time  3    Period  Weeks    Status  Achieved      Additional Long Term Goals   Additional Long Term Goals  Yes      SLP LONG TERM GOAL #6   Title  pt will read a 2-paragraph selection with copmensations for comprehension    Time  2    Period  Weeks    Status  New       Plan - 02/28/20 1357    Clinical Impression Statement  Ongoing improvement in speed, accuracy and comprehension of simple written paragraphs, approx 3rd to 4th grade level. She continues to require mod to max A for higher level reading resulting in frustration. She uses compensations for aphasia in verbal expression successfully. Continue skilled ST to [facility] communication for possible return to work    Speech Therapy Frequency  2x / week    Duration  --   10 weeks or 20 visits   Treatment/Interventions  Language facilitation;Environmental controls;Cueing hierarchy;SLP instruction and feedback;Compensatory strategies;Functional tasks;Cognitive reorganization;Compensatory techniques;Internal/external aids;Multimodal communcation  approach;Patient/family education    Potential to Achieve Goals  Good       Patient will benefit from skilled therapeutic intervention in order to improve the following deficits and impairments:   Aphasia    Problem List Patient Active  Problem List   Diagnosis Date Noted  . Intracranial venous  - L vein of [place] 09/05/2019  . Hyperlipemia 09/05/2019  . Obesity 09/05/2019  . Marijuana use 09/05/2019  . Hypokalemia 09/05/2019  . 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 Obstructive hydrocephalus (HCC) 08/28/2019  . Aphasia due to acute stroke (HCC) 08/28/2019  . ICH (intracerebral hemorrhage) (HCC)-L ICH w/ IVH s/p crani 08/25/2019    [facility] ,MS, CCC-SLP  02/28/2020, 2:00 PM  [facility] [address] [place], [place], [address] Phone: [phone]   Fax:  [phone]   Name: HAYLEN BELLOTTI MRN: [id number] Date of Birth: January 16, 1981

## 2020-02-28 NOTE — Patient Instructions (Signed)
Activate text-to-speech function on your phone to verify comprehension of written information.

## 2020-03-04 ENCOUNTER — Ambulatory Visit: Payer: Medicaid Other

## 2020-03-06 ENCOUNTER — Other Ambulatory Visit: Payer: Medicaid Other

## 2020-03-14 ENCOUNTER — Other Ambulatory Visit: Payer: Self-pay

## 2020-03-14 ENCOUNTER — Ambulatory Visit: Payer: Medicaid Other | Admitting: Diagnostic Neuroimaging

## 2020-03-14 DIAGNOSIS — R569 Unspecified convulsions: Secondary | ICD-10-CM | POA: Diagnosis not present

## 2020-03-14 DIAGNOSIS — I69398 Other sequelae of cerebral infarction: Secondary | ICD-10-CM

## 2020-03-15 NOTE — Procedures (Signed)
   [facility]  EEG (ELECTROENCEPHALOGRAM) REPORT   STUDY DATE: 03/14/20 PATIENT NAME: Amanda Small DOB: 05-03-1981 MRN: [id number]  ORDERING CLINICIAN: Ihor Austin, NP   TECHNOLOGIST: Charlett Blake  TECHNIQUE: Electroencephalogram was recorded utilizing standard 10-20 system of lead placement and reformatted into average and bipolar montages.  RECORDING TIME: 27 minutes  ACTIVATION: hyperventilation and photic stimulation  CLINICAL INFORMATION: 39 year old female with possible seizure.  FINDINGS: Posterior dominant background rhythms, which attenuate with eye opening, ranging 9-10 hertz and 10-20 microvolts. No focal, lateralizing, epileptiform activity or seizures are seen. Patient recorded in the awake and drowsy state. EKG channel shows regular rhythm of 50-60 beats per minute.   IMPRESSION:   Normal EEG in awake and drowsy states.     INTERPRETING PHYSICIAN:  Suanne Marker, MD Certified in Neurology, Neurophysiology and Neuroimaging  [facility] [address] [place], [place] [address] [phone]

## 2020-03-19 ENCOUNTER — Telehealth: Payer: Self-pay

## 2020-03-19 NOTE — Telephone Encounter (Signed)
Pt was notified of of the message below

## 2020-03-19 NOTE — Telephone Encounter (Signed)
-----   Message from Ihor Austin, NP sent at 03/18/2020 12:30 PM EDT ----- Please advise patient that recent EEG did not show evidence of seizure activity.  Please continue current regimen with Keppra

## 2020-04-09 ENCOUNTER — Emergency Department (HOSPITAL_COMMUNITY): Payer: Medicaid Other

## 2020-04-09 ENCOUNTER — Encounter (HOSPITAL_COMMUNITY): Payer: Self-pay | Admitting: Emergency Medicine

## 2020-04-09 ENCOUNTER — Emergency Department (HOSPITAL_COMMUNITY)
Admission: EM | Admit: 2020-04-09 | Discharge: 2020-04-09 | Disposition: A | Discharge status: Home / Self Care | Payer: Medicaid Other | Attending: Emergency Medicine | Admitting: Emergency Medicine

## 2020-04-09 ENCOUNTER — Telehealth: Payer: Self-pay | Admitting: Adult Health

## 2020-04-09 DIAGNOSIS — M25521 Pain in right elbow: Secondary | ICD-10-CM | POA: Diagnosis not present

## 2020-04-09 DIAGNOSIS — R531 Weakness: Secondary | ICD-10-CM | POA: Diagnosis present

## 2020-04-09 DIAGNOSIS — R200 Anesthesia of skin: Secondary | ICD-10-CM | POA: Insufficient documentation

## 2020-04-09 DIAGNOSIS — M79644 Pain in right finger(s): Secondary | ICD-10-CM | POA: Diagnosis not present

## 2020-04-09 DIAGNOSIS — Z5321 Procedure and treatment not carried out due to patient leaving prior to being seen by health care provider: Secondary | ICD-10-CM | POA: Insufficient documentation

## 2020-04-09 DIAGNOSIS — R202 Paresthesia of skin: Secondary | ICD-10-CM | POA: Diagnosis not present

## 2020-04-09 DIAGNOSIS — M25511 Pain in right shoulder: Secondary | ICD-10-CM | POA: Diagnosis not present

## 2020-04-09 DIAGNOSIS — H538 Other visual disturbances: Secondary | ICD-10-CM | POA: Diagnosis not present

## 2020-04-09 LAB — COMPREHENSIVE METABOLIC PANEL
ALT: 17 U/L (ref 0–44)
AST: 22 U/L (ref 15–41)
Albumin: 3.9 g/dL (ref 3.5–5.0)
Alkaline Phosphatase: 43 U/L (ref 38–126)
Anion gap: 7 (ref 5–15)
BUN: 5 mg/dL — ABNORMAL LOW (ref 6–20)
CO2: 25 mmol/L (ref 22–32)
Calcium: 9.2 mg/dL (ref 8.9–10.3)
Chloride: 105 mmol/L (ref 98–111)
Creatinine, Ser: 0.8 mg/dL (ref 0.44–1.00)
GFR calc Af Amer: >60 mL/min (ref >60)
GFR calc non Af Amer: >60 mL/min (ref >60)
Glucose, Bld: 73 mg/dL (ref 70–99)
Potassium: 4.6 mmol/L (ref 3.5–5.1)
Sodium: 137 mmol/L (ref 135–145)
Total Bilirubin: 1 mg/dL (ref 0.3–1.2)
Total Protein: 6.6 g/dL (ref 6.5–8.1)

## 2020-04-09 LAB — CBC
HCT: 36.2 % (ref 36.0–46.0)
Hemoglobin: 11.4 g/dL — ABNORMAL LOW (ref 12.0–15.0)
MCH: 28.9 pg (ref 26.0–34.0)
MCHC: 31.5 g/dL (ref 30.0–36.0)
MCV: 91.9 fL (ref 80.0–100.0)
Platelets: 205 10*3/uL (ref 150–400)
RBC: 3.94 MIL/uL (ref 3.87–5.11)
RDW: 13.8 % (ref 11.5–15.5)
WBC: 6.3 10*3/uL (ref 4.0–10.5)
nRBC: 0 % (ref 0.0–0.2)

## 2020-04-09 LAB — I-STAT CHEM 8, ED
BUN: 4 mg/dL — ABNORMAL LOW (ref 6–20)
Calcium, Ion: 1.19 mmol/L (ref 1.15–1.40)
Chloride: 106 mmol/L (ref 98–111)
Creatinine, Ser: 0.7 mg/dL (ref 0.44–1.00)
Glucose, Bld: 65 mg/dL — ABNORMAL LOW (ref 70–99)
HCT: 39 % (ref 36.0–46.0)
Hemoglobin: 13.3 g/dL (ref 12.0–15.0)
Potassium: 4.7 mmol/L (ref 3.5–5.1)
Sodium: 140 mmol/L (ref 135–145)
TCO2: 22 mmol/L (ref 22–32)

## 2020-04-09 LAB — PROTIME-INR
INR: 1.1 (ref 0.8–1.2)
Prothrombin Time: 13.3 seconds (ref 11.4–15.2)

## 2020-04-09 LAB — I-STAT BETA HCG BLOOD, ED (MC, WL, AP ONLY): I-stat hCG, quantitative: <5 m[IU]/mL (ref <5)

## 2020-04-09 LAB — DIFFERENTIAL
Abs Immature Granulocytes: 0.02 10*3/uL (ref 0.00–0.07)
Basophils Absolute: 0.1 10*3/uL (ref 0.0–0.1)
Basophils Relative: 1 %
Eosinophils Absolute: 0.1 10*3/uL (ref 0.0–0.5)
Eosinophils Relative: 1 %
Immature Granulocytes: 0 %
Lymphocytes Relative: 37 %
Lymphs Abs: 2.4 10*3/uL (ref 0.7–4.0)
Monocytes Absolute: 0.5 10*3/uL (ref 0.1–1.0)
Monocytes Relative: 8 %
Neutro Abs: 3.3 10*3/uL (ref 1.7–7.7)
Neutrophils Relative %: 53 %

## 2020-04-09 LAB — APTT: aPTT: 33 seconds (ref 24–36)

## 2020-04-09 NOTE — ED Triage Notes (Signed)
Pt in w/R arm weakness/pain at elbow x 1 wk. States she had CVA back in December, has only R eye peripheral vision loss from this. Today, c/o pain to [facility] of R elbow, radiates up to R shoulder, down to R fingers. Having numbness and tingling. Speech clear, no drift, face equal. States she has a 40 yr old son that is very heavy, thinks holding him caused this pain 1 wk ago

## 2020-04-09 NOTE — ED Notes (Signed)
Pt stated she is leaving. 

## 2020-04-09 NOTE — Telephone Encounter (Signed)
Pt called, Pt at ED do not want to wait for results from test to see if she had a stroke. Would like Nurse to call to her with results of the test

## 2020-04-10 NOTE — Telephone Encounter (Signed)
CT head from ED was negative for acute stroke or abnormality.  Please advised to follow-up with PCP for further evaluation as symptoms could be potentially from cervical neck issues or nerve impingement

## 2020-04-10 NOTE — Telephone Encounter (Signed)
The patient called again requesting her results be reviewed.

## 2020-04-10 NOTE — Telephone Encounter (Signed)
I called pt and relayed that per JM/NP CT head was negative for acute stroke or abnormality.  See pcp for further evaluation could be from cervical neck issues.  She has appt scheduled with pcp/MD next week.

## 2020-04-24 DIAGNOSIS — Z0289 Encounter for other administrative examinations: Secondary | ICD-10-CM

## 2020-05-01 ENCOUNTER — Telehealth: Payer: Self-pay | Admitting: *Deleted

## 2020-05-01 NOTE — Telephone Encounter (Signed)
I called and spoke to sister of pt.  Asked re: pt care services for pt since she had stroke.  Needs help with med reminders, since has short term memory (leaving stove on), no help with bathing or dressing.  More supervision. She was at another doctor appt and stated just forget it, as this form was sent from [facility] personal care service (she was not going to use them).

## 2020-05-02 NOTE — Telephone Encounter (Signed)
Cognitive concerns and need of assistance were not discussed at prior visit nor was a concern of patient.  We would need her to come into office in order to complete forms

## 2020-05-06 NOTE — Telephone Encounter (Signed)
I called pt and offered appt to dicuss issues relating there pt care services in the home requested.  Pt then asked about refill of her seizure medications.  I relayed that refills available.  She will then call us back if needed re: to care form.

## 2020-07-22 ENCOUNTER — Telehealth: Payer: Self-pay | Admitting: Adult Health

## 2020-07-22 NOTE — Telephone Encounter (Signed)
Phone rep worked Risk manager, at 12:19 pt left voicemail asking to be called re: an urgent matter.  Pt was called back and a message was left with [facility]'s office #.  This is Financial planner

## 2020-07-23 ENCOUNTER — Other Ambulatory Visit: Payer: Self-pay

## 2020-07-23 ENCOUNTER — Encounter: Payer: Self-pay | Admitting: Adult Health

## 2020-07-23 ENCOUNTER — Ambulatory Visit: Payer: Medicaid Other | Admitting: Adult Health

## 2020-07-23 VITALS — BP 100/67 | HR 59 | Ht 69.0 in | Wt 156.0 lb

## 2020-07-23 DIAGNOSIS — R569 Unspecified convulsions: Secondary | ICD-10-CM | POA: Diagnosis not present

## 2020-07-23 DIAGNOSIS — I69398 Other sequelae of cerebral infarction: Secondary | ICD-10-CM | POA: Diagnosis not present

## 2020-07-23 DIAGNOSIS — I611 Nontraumatic intracerebral hemorrhage in hemisphere, cortical: Secondary | ICD-10-CM | POA: Diagnosis not present

## 2020-07-23 DIAGNOSIS — G43009 Migraine without aura, not intractable, without status migrainosus: Secondary | ICD-10-CM | POA: Diagnosis not present

## 2020-07-23 DIAGNOSIS — S161XXS Strain of muscle, fascia and tendon at neck level, sequela: Secondary | ICD-10-CM

## 2020-07-23 MED ORDER — METHYLPREDNISOLONE 4 MG PO TBPK: ORAL_TABLET | ORAL | 0 refills | Status: DC

## 2020-07-23 MED ORDER — UBRELVY 100 MG PO TABS: 100.0000 mg | ORAL_TABLET | ORAL | 0 refills | Status: DC | PRN

## 2020-07-23 NOTE — Patient Instructions (Signed)
Your Plan:  Start medrol dose pack for 6 days  Samples of Ubrelvy provided to take as needed -may repeat x1 after 2 hours if no benefit.  Max dose 200mg /24 hours  Referral placed to PT for dry needling  Continue Keppra 500 mg twice daily for seizure prevention  Continue aspirin 81 mg daily for secondary stroke prevention   Follow-up in 3 months or call earlier if needed     Thank you for coming to see Amanda Small at [facility]. I hope we have been able to provide you high quality care today.  You may receive a patient satisfaction survey over the next few weeks. We would appreciate your feedback and comments so that we may continue to improve ourselves and the health of our patients.

## 2020-07-23 NOTE — Telephone Encounter (Signed)
I called and spoke to sister of pt.  She stated appreciated call, they have appt 07-23-20 at 1045 for headache.

## 2020-07-23 NOTE — Progress Notes (Signed)
[facility] [address] [place]. [place] [address] [phone]       OFFICE FOLLOW-UP NOTE  [name] Date of Birth:  11-22-1980 Medical Record Number:  [id number]    Chief complaint: Chief Complaint  Patient presents with  . Follow-up  . Headache    pt says she has had a headache for the past two weeks  . Cerebrovascular Accident    pt said she want s to f/u.     HPI:   Today, 07/23/2020, Amanda Small returns per request with complaints of headache over the past 2 weeks. Previously seen 01/31/2020 for stroke follow-up but has not followed up since that time.  Reports onset of headaches over the past 2 weeks daily consisting of either right or left temporal pain associated with photophobia, phonophobia and nausea.  She does report one episode of left temporal headache with left eyelid weakness which resolved with resolution of headache.  Denies visual changes or any other neurological deficit.  Pain characteristic of pressure and sometimes pulsating.  All symptoms resolved with resolution of headache typically lasting 1 to 2 hours.  Trialed Tylenol without benefit.  She has been using Excedrin and BC powder intermittently with benefit.  Reports headaches usually occur mid-to-late afternoon or during increased stress.  She has not had any headache yesterday or today thus far.  History of migraines similar to what she is currently experiencing but has not experienced migraines over the past 6 years.  Previously treated for migraines but unable to remember what medication she has tried.  She has been experiencing increased neck pain and tightness since July. She presented to the ED on 04/09/2020 with complaints of R AC pain radiating up to shoulder and down to right fingers as well as numbness/tingling.  She underwent CT head which was negative for acute abnormality but left prior to additional work-up.  She has experienced cervical neck pain previously undergoing MRI  cervical spine 07/28/2019 which showed mild multilevel spinal canal narrowing and multilevel neural foraminal narrowing most pronounced right C5-6 where it appears moderate.  She does report increased stressors at home as well as uncontrolled anxiety and depression and increased irritability which has been more present since her stroke.  She is currently on sertraline and Klonopin per PCP.  She was previously followed by psychiatry but no follow-up recently.  Residual stroke deficits of aphasia and visual impairment which have been stable without worsening.  Reports great improvement of aphasia and typically only present with increased stressors and anxiety.  Denies new stroke/TIA symptoms.  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 She remains on Keppra 500 mg twice daily for seizure prophylaxis without any additional seizure activity since 12/2019.  Completed MRI brain and MR venogram on 02/02/2020. MRV unremarkable with patent superficial and deep cerebral venous sinuses without evidence of obstruction. MRI showed expected evolutionary changes of left temporal and parietal hemorrhage no evidence of enhancing lesions or acute abnormality.    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  Residual deficits of decreased vision right eye and mild aphasia with great improvement of language.  Continues to work with outpatient therapy.  Denies residual cognitive difficulties.  MRI w/wo and MRV scheduled on 02/02/2020.  She does report having seizure activity approximately 3 weeks ago while she was visiting her friend in [place], [place].  Unable to view records from hospitalization.  She remembers speaking with her friend and then feeling as though "something was about to happen" and then she woke up in the hospital.  She was  previously on Keppra for seizure prophylaxis s/p craniotomy but this was discontinued by neurosurgery.  Apparently, no recommendations such as initiating AED and was discharged home.  She decided to restart Keppra 500 mg twice daily as she had leftover medication due to concern of recurrent seizure activity.  She has not had any reoccurring seizure activity and tolerating Keppra well.  Continues on aspirin 81 mg daily without bleeding or bruising.  Blood pressure today 102/68.  No further concerns at this time.  Initial consult visit 10/31/2019 Dr. Pearlean Brownie: Amanda Small is a pleasant 39 year old African-American lady seen today for initial office follow-up visit following hospital admission for intracerebral hemorrhage in December 2020.  She is accompanied by sister.  History is obtained from them and review of electronic medical records and I personally reviewed imaging films in PACS.  She has no significant past medical history who presented to [facility] after last known normal being on 11 PM on 08/24/2019 when she went to bed.  She woke up the next morning with a headache and neck pain.  She had delivered a child in August 2020 and was still breast-feeding.  She presented with right-sided weakness, and confusion.  CT scan of the head done in the ER showed large left temporal intracerebral hemorrhage with extension into the ventricles.  She had a Glasgow Coma Scale of 12 and was initially able to speak but had slurred speech and right-sided weakness.  She was intubated for airway protection and stat neurosurgical consultation was obtained.  Her blood pressure was tightly controlled.  She was taken for emergent craniotomy for evacuation of the clot.  She also had ventriculostomy placed to drain her ventricles.  She had a prolonged stay in the ICU but did well and made gradual improvement.  CT angiogram of the head and neck was unremarkable but there is abnormal left parietal dilated vein raising concern for  vein of Trolard thrombosis.  This was subsequently confirmed on diagnostic cerebral catheter angiogram on 08/31/2019 by Dr. Conchita Paris who noticed a relatively large cortical vein over the left frontoparietal convexity emptying into the midportion of the superior sagittal sinus with paucity of ramifying veins emptying into this large cortical vein suggesting likelihood of cortical venous thrombosis involving vein of Trolard.  The large cerebral venous sinuses are patent 2D echo showed normal ejection fraction.  Covid test was negative.  LDL was 124 mg percent.  Hemoglobin A1c was 4.7.  Urine drug screen was positive for marijuana.  Hypercoagulable panel labs were all negative.  Vasculitic labs were also negative patient obtained significant improvement initially she was felt to be candidate for inpatient rehab but she improved rapidly and was discharged home with requirement for 24/7 supervision.  She states she is continued to do well.  Physically she is able to ambulate independently without assistance but she is still has significant memory and cognitive difficulties.  Speech is improved but she still had word hesitancy and occasional paraphasias.  She cannot multitask and has poor short-term memory.  She gets frustrated and agitated easily.  She recently saw Dr. Mikal Plane who discontinued Keppra which was given only for seizure prophylaxis for surgery and she did not have a clear documented seizure.  She is able to take care of her young child and attend to  his needs.  She is living with her mother and asked somebody around her 24 hours.  She was kept in the ICU and blood pressure tightly controlled and monitored.  She was extubated.  She made gradual improvement  ROS:   14 system review of systems is positive for headache and all other systems negative  PMH: No past medical history on file.  Social History:  Social History   Socioeconomic History  . Marital status: Single    Spouse name: Not on file  .  Number of children: Not on file  . Years of education: Not on file  . Highest education level: Not on file  Occupational History  . Not on file  Tobacco Use  . Smoking status: Never Smoker  . Smokeless tobacco: Never Used  Substance and Sexual Activity  . Alcohol use: Not on file  . Drug use: Not on file  . Sexual activity: Not on file  Other Topics Concern  . Not on file  Social History Narrative  . Not on file   Social Determinants of Health   Financial Resource Strain:   . Difficulty of Paying Living Expenses: Not on file  Food Insecurity:   . Worried About Programme researcher, broadcasting/film/video in the Last Year: Not on file  . Ran Out of Food in the Last Year: Not on file  Transportation Needs: No Transportation Needs  . Lack of Transportation (Medical): No  . Lack of Transportation (Non-Medical): No  Physical Activity:   . Days of Exercise per Week: Not on file  . Minutes of Exercise per Session: Not on file  Stress:   . Feeling of Stress : Not on file  Social Connections:   . Frequency of Communication with Friends and Family: Not on file  . Frequency of Social Gatherings with Friends and Family: Not on file  . Attends Religious Services: Not on file  . Active Member of Clubs or Organizations: Not on file  . Attends Banker Meetings: Not on file  . Marital Status: Not on file  Intimate Partner Violence:   . Fear of Current or Ex-Partner: Not on file  . Emotionally Abused: Not on file  . Physically Abused: Not on file  . Sexually Abused: Not on file    Medications:   Current Outpatient Medications on File Prior to Visit  Medication Sig Dispense Refill  . aspirin EC 81 MG tablet Take 1 tablet (81 mg total) by mouth daily. 150 tablet 2  . clonazePAM (KLONOPIN) 0.5 MG tablet Take 0.5 mg by mouth 2 (two) times daily as needed for anxiety.    . levETIRAcetam (KEPPRA) 500 MG tablet Take 1 tablet (500 mg total) by mouth 2 (two) times daily. 180 tablet 3  . Sertraline HCl  (ZOLOFT PO) Take by mouth.     No current facility-administered medications on file prior to visit.    Allergies:   Allergies  Allergen Reactions  . Penicillins Itching      Vitals Today's Vitals   07/23/20 1027  BP: 100/67  Pulse: (!) 59  Weight: 156 lb (70.8 kg)  Height:  (1.753 m)   Body mass index is 23.04 kg/m.  Physical Exam General: well developed, well nourished, pleasant middle-aged African-American lady, seated, in no evident distress Head: head normocephalic and atraumatic.  Neck: supple with no carotid or supraclavicular bruits Cardiovascular: regular rate and rhythm, no murmurs Musculoskeletal: no deformity.  Skin:  no rash/petichiae Vascular:  Normal pulses all  extremities  Neurologic Exam Mental Status: Awake and fully alert.  Fluent speech and language.  Oriented to place and time. Recent and remote memory intact.  Attention span, concentration and fund of knowledge diminished. Mood and affect appropriate.   Cranial Nerves: Pupils equal, briskly reactive to light. Extraocular movements full without nystagmus. Visual fields right eye right periphery loss, visual field full in the left eye. Hearing intact. Facial sensation intact. Face, tongue, palate moves normally and symmetrically.  Motor: Normal bulk and tone. Normal strength in all tested extremity muscles. Sensory.: intact to touch ,pinprick .position and vibratory sensation.  Coordination: Rapid alternating movements normal in all extremities. Finger-to-nose and heel-to-shin performed accurately bilaterally. Gait and Station: Arises from chair without difficulty. Stance is normal. Gait demonstrates normal stride length and balance . Able to heel, toe and tandem walk without difficulty.  Reflexes: 1+ and symmetric. Toes downgoing.      ASSESSMENT/PLAN:   39 year old African-American lady with left and pleural parenchymal intracerebral hemorrhage as well as intraventricular hemorrhage in December  2020 requiring craniotomy as well as ventriculostomy of  unclear etiology.  Possibly vein of Trolard thrombosis but it occurred nearly 3 months postpartum making association with pregnancy doubtful.  Lab work for vasculitis and hypercoagulable panel were negative.  Seizure activity 12/2019 after discontinuing Keppra as advised by neurosurgery without any reoccurring seizure activity after restarting Keppra.  Returns today, 07/23/2020, with complaints of 2-week onset of headaches   Headache -per symptom report, appears migrainous with unilateral temporal pulsating and at times pressure sensation associated with photophobia, phonophobia and nausea.  History of migraine headaches similar to current headaches but has not experienced any reoccurrence over the past 6 years until recently -No focal deficits and similar to prior migraine headaches therefore no indication for imaging at this time -Possible recurrence in setting of increased cervical neck pain and increased stressors with uncontrolled depression and anxiety -Referral placed to PT for dry needling of bilateral upper trapezius or as indicated -Prescribed Medrol Dosepak as well as provide samples of Ubrevly for emergent relief.  Triptan contraindicated due to stroke history -Advised to avoid Excedrin, BC powders or any other OTC medications containing aspirin or acetaminophen -Minimal preventative choices as blood pressure and heart rate routinely run on the lower side, history of seizures and currently on SSRI.  This can be further discussed in the future if indicated -Encourage follow-up with PCP or psychiatry for uncontrolled anxiety and depression -Discussed increased severity or duration of headaches, change in characteristics, visual changes, or altered mental status to call 911 immediately for further evaluation  Hx of ICH -Residual aphasia and visual impairment (OD right periphery) -continued improvement -MRV unremarkable for underlying  abnormalities -MRI stable -Continue aspirin 81 mg daily for secondary stroke prevention -Discussed secondary stroke prevention measures and importance of close PCP follow-up for aggressive stroke risk factor management  Seizure, late effect of stroke -Prior seizure activity 12/2019 evaluated at [facility] consisting of shaking and unresponsive lasting 2 to 3 minutes resolved spontaneously without intervention with postictal confusion -EEG 03/14/2020 normal -Continue Keppra 500 mg twice daily for seizure prophylaxis -refill provided   Follow-up in 3 months or call earlier if needed   CC:  [facility] provider: Dr. Para MarchSethi Small, Chapman Mossavid Jr., MD    I spent 35 minutes of face-to-face and non-face-to-face time with patient.  This included previsit chart review, lab review, study review, order entry, electronic health record documentation, patient education regarding recent onset of headaches and further treatment options, history  of prior stroke, residual deficits, seizure activity and answered all questions to patient satisfaction   Ihor Austin, [facility]  [facility] [address] [place], [place] [address]  Phone [phone] Fax [phone] Note: This document was prepared with digital dictation and possible smart phrase technology. Any transcriptional errors that result from this process are unintentional.

## 2020-07-23 NOTE — Progress Notes (Signed)
I agree with the above plan 

## 2020-08-13 ENCOUNTER — Ambulatory Visit: Payer: Medicaid Other | Attending: Adult Health | Admitting: Physical Therapy

## 2020-08-13 ENCOUNTER — Encounter: Payer: Self-pay | Admitting: Physical Therapy

## 2020-08-13 ENCOUNTER — Other Ambulatory Visit: Payer: Self-pay

## 2020-08-13 DIAGNOSIS — M6281 Muscle weakness (generalized): Secondary | ICD-10-CM

## 2020-08-13 DIAGNOSIS — M542 Cervicalgia: Secondary | ICD-10-CM

## 2020-08-13 DIAGNOSIS — M62830 Muscle spasm of back: Secondary | ICD-10-CM

## 2020-08-13 DIAGNOSIS — Z0271 Encounter for disability determination: Secondary | ICD-10-CM

## 2020-08-13 NOTE — Therapy (Addendum)
[facility] [address] [place], [place], [address] Phone: [phone]   Fax:  [phone]  Physical Therapy Evaluation  Patient Details  Name: SANDRINA HEATON MRN: [id number] Date of Birth: 1981-07-17 Referring Provider (PT): Ihor Austin, NP   Encounter Date: 08/13/2020   PT End of Session - 08/13/20 1208    Visit Number 1    Number of Visits 13    Date for PT Re-Evaluation 09/17/20    Authorization Type [employer] M[employer]    Progress Note Due on Visit 10    PT Start Time 1109   Pt arrived late and was on the phone   PT Stop Time 1151    PT Time Calculation (min) 42 min    Activity Tolerance Patient tolerated treatment well    Behavior During Therapy [facility] for tasks assessed/performed           Past Medical History:  Diagnosis Date  . Seizures (HCC) 01/2020  . Stroke [facility]) 2020    Past Surgical History:  Procedure Laterality Date  . CRANIOTOMY Left 08/25/2019   Procedure: LEFT CRANIECTOMY;  Surgeon: Coletta Memos, MD;  Location: [facility] OR;  Service: Neurosurgery;  Laterality: Left;  . IR ANGIO INTRA EXTRACRAN SEL INTERNAL CAROTID BILAT MOD SED  08/31/2019  . IR ANGIO VERTEBRAL SEL VERTEBRAL UNI L MOD SED  08/31/2019  . VENTRICULOSTOMY Right 08/25/2019   Procedure: RIGHT FRONTAL VENTRICULAR CATHETER;  Surgeon: Coletta Memos, MD;  Location: [facility] OR;  Service: Neurosurgery;  Laterality: Right;    There were no vitals filed for this visit.    Subjective Assessment - 08/13/20 1114    Subjective "For about a month now I've had trouble lifting my left arm up to the side, pain in both my shoulders. I have a brand new bed and in the mornings it's really terrible pain. I have lost some weight but my thyroid was checked and it's fine. I need to get back to my exercises and yoga." Pt denies N/T and does not acknowledge any red flags.    Limitations Lifting    How long can you sit comfortably? unlimited    How long can you stand  comfortably? unlimited    How long can you walk comfortably? unlimited    Patient Stated Goals see if my back can feel better and get back to yoga    Currently in Pain? Yes    Pain Score 7     Pain Location Shoulder    Pain Orientation Right;Left;Upper;Mid    Pain Descriptors / Indicators Other (Comment)   pt is unsure   Pain Type Chronic pain    Pain Onset More than a month ago    Aggravating Factors  Lifting arm, picking up baby    Pain Relieving Factors heating pad, theragun              [facility] PT Assessment - 08/13/20 0001      Assessment   Medical Diagnosis Migraine without aura and without status migrainosus, not intractable G43.009, Strain of neck muscle, sequela S16.1XXS    Referring Provider (PT) Ihor Austin, NP    Onset Date/Surgical Date 07/16/20    Hand Dominance Right    Next MD Visit 3 months    Prior Therapy No      Precautions   Precautions None      Restrictions   Weight Bearing Restrictions No      Balance Screen   Has the patient fallen in  the past 6 months No   A couple trips    Has the patient had a decrease in activity level because of a fear of falling?  No    Is the patient reluctant to leave their home because of a fear of falling?  No      Home Environment   Living Environment Private residence    Living Arrangements Children;Parent    Available Help at Discharge Family    Type of Home Apartment    Home Access Level entry    Home Layout One level      Prior Function   Level of Independence Independent    Leisure Likes to eat      Observation/Other Assessments   Focus on Therapeutic Outcomes (FOTO)  No FOTO MCD      ROM / Strength   AROM / PROM / Strength AROM;Strength      AROM   AROM Assessment Site Cervical;Shoulder    Right/Left Shoulder Right;Left    Right Shoulder Flexion --   WFL   Right Shoulder ABduction --   [facility]   Right Shoulder Internal Rotation --   T7   Right Shoulder External Rotation --   t4   Left Shoulder  Flexion --   visually estimated 90deg Limited by pain   Left Shoulder ABduction --   visually 90 deg limited by pain   Left Shoulder Internal Rotation --   L5   Left Shoulder External Rotation --   unable to test due to pain   Cervical Flexion 60    Cervical Extension 60   CC pain in end range   Cervical - Right Side Bend 55   CC pain on both side   Cervical - Left Side Bend 45    Cervical - Right Rotation 65    Cervical - Left Rotation 65      Strength   Strength Assessment Site Shoulder    Right/Left Shoulder Right;Left    Right Shoulder Flexion 3+/5    Right Shoulder Extension 4+/5    Right Shoulder Internal Rotation 4+/5    Right Shoulder External Rotation 4/5    Left Shoulder Flexion 3+/5    Left Shoulder Extension 4-/5    Left Shoulder Internal Rotation 3+/5   limited by pain   Left Shoulder External Rotation 3+/5   limited by pain     Palpation   Spinal mobility CPAs T1-T7, no pain elicted    Palpation comment Bilateral UT & LS TTP with trigger points, cervical and thoracic paraspinals TTP through to T7, left with increased tension more than right                      Objective measurements completed on examination: See above findings.       [facility] Adult PT Treatment/Exercise - 08/13/20 0001      Exercises   Exercises Neck      Neck Exercises: Seated   Neck Retraction 5 reps;3 secs   VC for slow and controlled, small range movement     Shoulder Exercises: Seated   Retraction AROM;Both;10 reps   VC for scapula movement, not thoracic spine      Manual Therapy   Manual Therapy Joint mobilization;Soft tissue mobilization    Joint Mobilization Bilateral first rib grade I    Soft tissue mobilization TP release Left UT/LS, bilateral thoracic paraspinal STW      Neck Exercises: Stretches   Upper Trapezius Stretch Left;2 reps;30  seconds   VC for flexion and sidebend   Upper Trapezius Stretch Limitations moderate cuing required    Levator Stretch Left;2  reps;30 seconds                  PT Education - 08/13/20 1207    Education Details Physical therapy process, issued and reviewed HEP, importance of stress reduction and diaphragmatic breathing    Person(s) Educated Patient    Methods Explanation    Comprehension Verbalized understanding            PT Short Term Goals - 08/13/20 1231      PT SHORT TERM GOAL #1   Title Pt will increase range motion of the left shoulder by 15 degrees with </=4/10 pain report to increase functional mobility for ADLs such as grooming    Time 3    Period Weeks    Status New    Target Date 09/03/20      PT SHORT TERM GOAL #2   Title Pt will be IND with initial HEP    Time 3    Period Weeks    Status New    Target Date 09/03/20      PT SHORT TERM GOAL #3   Title Pt will demonstrate proper diaphragmatic breathing technique to reduce accessory musculture activation with decreased pain report </= 5/10    Time 3    Period Weeks    Status New    Target Date 09/03/20             PT Long Term Goals - 08/13/20 1235      PT LONG TERM GOAL #1   Title Pt will increase bilateral shoulder strength to >/= 4/5 to improve functional independence with ADLs    Time 6    Period Weeks    Status New    Target Date 09/24/20      PT LONG TERM GOAL #2   Title Pt will demonstrate left shoulder range of motion WFL compared to right shoulder with </= 1/10 pain to return to personal workout routine such as yoga    Time 6    Period Weeks    Status New    Target Date 09/24/20      PT LONG TERM GOAL #3   Title Pt will report </= 2/10 pain during 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    Time 6    Period Weeks    Status New    Target Date 09/24/20      PT LONG TERM GOAL #4   Title Pt will be educated on and be able to demonstrate proper lifting mechanics especially as it relates to childcare for their child / grandchildren    Time 6    Period Weeks    Status New     Target Date 09/24/20      PT LONG TERM GOAL #5   Title Pt to be IND with all provided HEP to promote wellness and general health    Time 6    Period Weeks    Status New    Target Date 09/24/20                  Plan - 08/13/20 1209    Clinical Impression Statement Pt is a 39 year old female who presents to OPPT with chief complaint of neck and back pain that began approximately a month ago idiopathically but that coincided with  a period of high stress that has continued. Pt reported CC pain with cervical extension and exhibited decreased range of motion and strength of the left shoulder limited by that same pain. Upon palpation pt's cervical parapsinals and periscapular musculature were very tender to palpation and patient reported CC pain. STW of the same musculature brought some relief and was well tolerated and pt is a good candidate for TPDN. Pt will benfit from therapy to reduce pain, increase strength, and improve functional mobility with ADLs.    Personal Factors and Comorbidities Comorbidity 2    Comorbidities Hx of CVA, hyperlipidemia    Examination-Activity Limitations Caring for Others;Lift;Reach Overhead    Examination-Participation Restrictions Cleaning;Other   personal workout   Stability/Clinical Decision Making Evolving/Moderate complexity    Clinical Decision Making Moderate    Rehab Potential Good    PT Frequency 2x / week    PT Duration 6 weeks    PT Treatment/Interventions ADLs/Self Care Home Management;Iontophoresis 4mg /ml Dexamethasone;Electrical Stimulation;Moist Heat;Traction;Ultrasound;Neuromuscular re-education;Balance training;Therapeutic exercise;Therapeutic activities;Functional mobility training;Patient/family education;Manual techniques;Dry needling;Passive range of motion;Taping;Joint Manipulations;Spinal Manipulations    PT Next Visit Plan TPDN of UT/LS, STW of cervical paraspinals and periscapular musculature, postural education handout, thoracic  mobility and/or periscapular strengthening    PT Home Exercise Plan [id number] - LS/UT stretch, chin tucks, scap retractions    Consulted and Agree with Plan of Care Patient           Patient will benefit from skilled therapeutic intervention in order to improve the following deficits and impairments:  Decreased activity tolerance, Hypomobility, Decreased range of motion, Increased muscle spasms, Impaired UE functional use, Improper body mechanics, Postural dysfunction, Pain  Visit Diagnosis: Cervicalgia  Muscle weakness (generalized)  Muscle spasm of back     Problem List Patient Active Problem List   Diagnosis Date Noted  . Intracranial venous  - L vein of [place] 09/05/2019  . Hyperlipemia 09/05/2019  . Obesity 09/05/2019  . Marijuana use 09/05/2019  . Hypokalemia 09/05/2019  . 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 Obstructive hydrocephalus (HCC) 08/28/2019  . Aphasia due to acute stroke (HCC) 08/28/2019  . ICH (intracerebral hemorrhage) (HCC)-L ICH w/ IVH s/p crani 08/25/2019    Johnn HaiLiam Allani Reber, SPT 08/13/2020, 1:01 PM  [facility] [address] [place], [place], [address] Phone: [phone]   Fax:  [phone]  Name: Lossie FaesSyreeta M Difonzo MRN: [id number] Date of Birth: 10/03/1980    Check all possible CPT codes: 97110- Therapeutic Exercise, [phone]- Neuro Re-education, [phone] - Gait Training, [phone] - Manual Therapy, 97530 - Therapeutic Activities, 97535 - Self Care, [phone] - Mechanical traction, 97014 - Electrical stimulation (unattended), [id number] - Iontophoresis, [id number] - Ultrasound and [id number] - Physical performance training

## 2020-09-04 ENCOUNTER — Ambulatory Visit: Payer: Medicaid Other | Admitting: Physical Therapy

## 2020-09-06 ENCOUNTER — Ambulatory Visit: Payer: Medicaid Other | Attending: Adult Health | Admitting: Physical Therapy

## 2020-09-06 ENCOUNTER — Encounter: Payer: Self-pay | Admitting: Physical Therapy

## 2020-09-06 ENCOUNTER — Other Ambulatory Visit: Payer: Self-pay

## 2020-09-06 DIAGNOSIS — M62830 Muscle spasm of back: Secondary | ICD-10-CM | POA: Diagnosis present

## 2020-09-06 DIAGNOSIS — M6281 Muscle weakness (generalized): Secondary | ICD-10-CM

## 2020-09-06 DIAGNOSIS — M542 Cervicalgia: Secondary | ICD-10-CM

## 2020-09-06 NOTE — Therapy (Signed)
[facility]patient [facility] [address] [place], [place], [address] Phone: [phone]   Fax:  [phone]  Physical Therapy Treatment  Patient Details  Name: Amanda Small MRN: [id number] Date of Birth: [birth year] Referring Provider (PT): Ihor Austin, NP   Encounter Date: 09/06/2020   PT End of Session - 09/06/20 0958    Visit Number 2    Number of Visits 13    Date for PT Re-Evaluation 09/17/20    Authorization Type [employer]    Authorization - Visit Number 1    Authorization - Number of Visits 27    PT Start Time 0930    PT Stop Time 1008    PT Time Calculation (min) 38 min           Past Medical History:  Diagnosis Date   Seizures (HCC) 01/2020   Stroke (HCC) 2020    Past Surgical History:  Procedure Laterality Date   CRANIOTOMY Left 08/25/2019   Procedure: LEFT CRANIECTOMY;  Surgeon: Coletta Memos, MD;  Location: [facility] OR;  Service: Neurosurgery;  Laterality: Left;   IR ANGIO INTRA EXTRACRAN SEL INTERNAL CAROTID BILAT MOD SED  08/31/2019   IR ANGIO VERTEBRAL SEL VERTEBRAL UNI L MOD SED  08/31/2019   VENTRICULOSTOMY Right 08/25/2019   Procedure: RIGHT FRONTAL VENTRICULAR CATHETER;  Surgeon: Coletta Memos, MD;  Location: [facility] OR;  Service: Neurosurgery;  Laterality: Right;    There were no vitals filed for this visit.   Subjective Assessment - 09/06/20 0933    Subjective " I am doing the HEP at home and am unsure if I am doing it right. I still have about 7/10 in the L side of the neck."    Patient Stated Goals see if my back can feel better and get back to yoga    Currently in Pain? Yes    Pain Score 7     Pain Location Neck    Pain Orientation Left    Pain Descriptors / Indicators Aching    Pain Type Chronic pain    Pain Onset More than a month ago    Pain Frequency Intermittent    Aggravating Factors  liting arm, picking up children    Pain Relieving Factors heating pad, theragun.               [facility] PT Assessment - 09/06/20 0001      Assessment   Medical Diagnosis Migraine without aura and without status migrainosus, not intractable G43.009, Strain of neck muscle, sequela S16.1XXS    Referring Provider (PT) Ihor Austin, NP    Onset Date/Surgical Date 07/16/20                         [facility] Adult PT Treatment/Exercise - 09/06/20 0001      Neck Exercises: Machines for Strengthening   Nustep L6 x UE/LE      Neck Exercises: Theraband   Rows 10 reps;Green   x 2 sets   Rows Limitations cues to avoid hiking shoulder      Neck Exercises: Seated   Neck Retraction 10 reps;5 secs   verbal cues and demonstration for proper form   Other Seated Exercise scapular retraction 2 x 10 holding 5 seconds      Manual Therapy   Manual Therapy Manual Traction    Manual therapy comments upper trap MTPR on the L and sub-occipital release    Joint Mobilization Bilateral first rib grade III  Manual Traction cervical grade III      Neck Exercises: Stretches   Upper Trapezius Stretch Left;2 reps;30 seconds    Upper Trapezius Stretch Limitations cues for proper posture    Levator Stretch Left;2 reps;30 seconds                  PT Education - 09/06/20 0936    Education Details reviewed HEP and how to perform at home.    Person(s) Educated Patient    Methods Explanation;Verbal cues    Comprehension Verbalized understanding;Verbal cues required            PT Short Term Goals - 08/13/20 1231      PT SHORT TERM GOAL #1   Title Pt will increase range motion of the left shoulder by 15 degrees with </=4/10 pain report to increase functional mobility for ADLs such as grooming    Time 3    Period Weeks    Status New    Target Date 09/03/20      PT SHORT TERM GOAL #2   Title Pt will be IND with initial HEP    Time 3    Period Weeks    Status New    Target Date 09/03/20      PT SHORT TERM GOAL #3   Title Pt will demonstrate proper diaphragmatic breathing  technique to reduce accessory musculture activation with decreased pain report </= 5/10    Time 3    Period Weeks    Status New    Target Date 09/03/20             PT Long Term Goals - 08/13/20 1235      PT LONG TERM GOAL #1   Title Pt will increase bilateral shoulder strength to >/= 4/5 to improve functional independence with ADLs    Time 6    Period Weeks    Status New    Target Date 09/24/20      PT LONG TERM GOAL #2   Title Pt will demonstrate left shoulder range of motion WFL compared to right shoulder with </= 1/10 pain to return to personal workout routine such as yoga    Time 6    Period Weeks    Status New    Target Date 09/24/20      PT LONG TERM GOAL #3   Title Pt will report </= 2/10 pain during 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    Time 6    Period Weeks    Status New    Target Date 09/24/20      PT LONG TERM GOAL #4   Title Pt will be educated on and be able to demonstrate proper lifting mechanics especially as it relates to childcare for their child / grandchildren    Time 6    Period Weeks    Status New    Target Date 09/24/20      PT LONG TERM GOAL #5   Title Pt to be IND with all provided HEP to promote wellness and general health    Time 6    Period Weeks    Status New    Target Date 09/24/20                 Plan - 09/06/20 0959    Clinical Impression Statement Pt reports consistency with her HEp but feels like she isn't doing them correctly. time take to review HEP with  cues for proper form. continued STW along the L upper trap and sub-occipitals combnined with posterior shoulder strengthening which she did well with requiring min cues for form. end of session she reported decreased pain/ stiffness.    PT Treatment/Interventions ADLs/Self Care Home Management;Iontophoresis 4mg /ml Dexamethasone;Electrical Stimulation;Moist Heat;Traction;Ultrasound;Neuromuscular re-education;Balance training;Therapeutic  exercise;Therapeutic activities;Functional mobility training;Patient/family education;Manual techniques;Dry needling;Passive range of motion;Taping;Joint Manipulations;Spinal Manipulations    PT Next Visit Plan TPDN of UT/LS (maybe try only 1 -2 areas), STW of cervical paraspinals and periscapular musculature, postural education handout, thoracic mobility and/or periscapular strengthening    PT Home Exercise Plan [id number] - LS/UT stretch, chin tucks, scap retractions    Consulted and Agree with Plan of Care Patient           Patient will benefit from skilled therapeutic intervention in order to improve the following deficits and impairments:  Decreased activity tolerance,Hypomobility,Decreased range of motion,Increased muscle spasms,Impaired UE functional use,Improper body mechanics,Postural dysfunction,Pain  Visit Diagnosis: Cervicalgia  Muscle weakness (generalized)  Muscle spasm of back     Problem List Patient Active Problem List   Diagnosis Date Noted   Intracranial venous  - L vein of [place] 09/05/2019   Hyperlipemia 09/05/2019   Obesity 09/05/2019   Marijuana use 09/05/2019   Hypokalemia 09/05/2019   Cytotoxic brain edema (HCC) 08/28/2019   Obstructive hydrocephalus (HCC) 08/28/2019   Aphasia due to acute stroke (HCC) 08/28/2019   ICH (intracerebral hemorrhage) (HCC)-L ICH w/ IVH s/p crani 08/25/2019   14/12/2018 PT, DPT, LAT, ATC  09/06/20  10:08 AM      [facility] [address] [place], [place], [place] Phone: [phone]   Fax:  [phone]  Name: Amanda Small MRN: [name] Date of Birth: [birth year]

## 2020-09-10 ENCOUNTER — Other Ambulatory Visit: Payer: Self-pay

## 2020-09-10 ENCOUNTER — Ambulatory Visit: Payer: Medicaid Other | Admitting: Physical Therapy

## 2020-09-10 DIAGNOSIS — M542 Cervicalgia: Secondary | ICD-10-CM | POA: Diagnosis not present

## 2020-09-10 DIAGNOSIS — M6281 Muscle weakness (generalized): Secondary | ICD-10-CM

## 2020-09-10 DIAGNOSIS — M62830 Muscle spasm of back: Secondary | ICD-10-CM

## 2020-09-11 ENCOUNTER — Encounter: Payer: Self-pay | Admitting: Physical Therapy

## 2020-09-11 NOTE — Therapy (Addendum)
[place], [place], [address] Phone: [phone]   Fax:  [phone]  Physical Therapy Treatment/Discharge   Patient Details  Name: Amanda Small MRN: [id number] Date of Birth: 09-03-1981 Referring Provider (PT): Frann Rider, NP   Encounter Date: 09/10/2020   PT End of Session - 09/11/20 1333    Visit Number 3    Number of Visits 13    Date for PT Re-Evaluation 09/17/20    Authorization Type [employer]    PT Start Time 0459    PT Stop Time 1224    PT Time Calculation (min) 39 min    Activity Tolerance Patient tolerated treatment well    Behavior During Therapy [facility] for tasks assessed/performed           Past Medical History:  Diagnosis Date  . Seizures ([place]) 01/2020  . Stroke [facility]) 2020    Past Surgical History:  Procedure Laterality Date  . CRANIOTOMY Left 08/25/2019   Procedure: LEFT CRANIECTOMY;  Surgeon: [name], MD;  Location: [place];  Service: Neurosurgery;  Laterality: Left;  . IR ANGIO INTRA EXTRACRAN SEL INTERNAL CAROTID BILAT MOD SED  08/31/2019  . IR ANGIO VERTEBRAL SEL VERTEBRAL UNI L MOD SED  08/31/2019  . VENTRICULOSTOMY Right 08/25/2019   Procedure: RIGHT FRONTAL VENTRICULAR CATHETER;  Surgeon: [name], MD;  Location: [place];  Service: Neurosurgery;  Laterality: Right;    There were no vitals filed for this visit.   Subjective Assessment - 09/11/20 1331    Subjective Patient reports she might be doing a little better. She is not having pain now but she did have pain this morning.    Limitations Lifting    How long can you sit comfortably? unlimited    How long can you stand comfortably? unlimited    How long can you walk comfortably? unlimited    Patient Stated Goals see if my back can feel better and get back to yoga    Currently in Pain? Yes    Pain Score 1     Pain Location Neck    Pain Orientation Left    Pain Descriptors / Indicators Aching     Pain Type Acute pain    Pain Onset More than a month ago    Pain Frequency Intermittent    Aggravating Factors  lifting arm, picking up chikdren    Pain Relieving Factors heating pad                             [facility] Adult PT Treatment/Exercise - 09/11/20 0001      Self-Care   Self-Care Other Self-Care Comments    Other Self-Care Comments  reviewed      Neck Exercises: Standing   Other Standing Exercises scaqp retraction 2x10 red; shoulder extnesion 2x10 red      Neck Exercises: Seated   Other Seated Exercise bilateral ER 2x10 red      Manual Therapy   Manual Therapy Manual Traction    Manual therapy comments upper trap MTPR on the L and sub-occipital release    Joint Mobilization Bilateral first rib grade III    Manual Traction cervical grade III      Neck Exercises: Stretches   Upper Trapezius Stretch Left;2 reps;30 seconds    Upper Trapezius Stretch Limitations cues for proper posture    Levator Stretch Left;2 reps;30 seconds  PT Education - 09/11/20 1332    Education Details HEP and symptom mangement    Person(s) Educated Patient    Methods Explanation;Demonstration;Tactile cues;Verbal cues    Comprehension Verbalized understanding;Returned demonstration;Verbal cues required;Tactile cues required            PT Short Term Goals - 08/13/20 1231      PT SHORT TERM GOAL #1   Title Pt will increase range motion of the left shoulder by 15 degrees with </=4/10 pain report to increase functional mobility for ADLs such as grooming    Time 3    Period Weeks    Status New    Target Date 09/03/20      PT SHORT TERM GOAL #2   Title Pt will be IND with initial HEP    Time 3    Period Weeks    Status New    Target Date 09/03/20      PT SHORT TERM GOAL #3   Title Pt will demonstrate proper diaphragmatic breathing technique to reduce accessory musculture activation with decreased pain report </= 5/10    Time 3    Period Weeks     Status New    Target Date 09/03/20             PT Long Term Goals - 08/13/20 1235      PT LONG TERM GOAL #1   Title Pt will increase bilateral shoulder strength to >/= 4/5 to improve functional independence with ADLs    Time 6    Period Weeks    Status New    Target Date 09/24/20      PT LONG TERM GOAL #2   Title Pt will demonstrate left shoulder range of motion WFL compared to right shoulder with </= 1/10 pain to return to personal workout routine such as yoga    Time 6    Period Weeks    Status New    Target Date 09/24/20      PT LONG TERM GOAL #3   Title Pt will report </= 2/10 pain during 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 therapy adfded resitvie exercises for postrual correction. She had no increase in pain. therapy also focused on manual therapy. She had improved ability to perfrom manual therapy.    Comorbidities Hx of CVA, hyperlipidemia    Examination-Activity Limitations Caring for Others;Lift;Reach Overhead    Examination-Participation Restrictions Cleaning;Other    Stability/Clinical Decision Making Evolving/Moderate complexity    Clinical Decision Making Moderate    Rehab Potential Good  PT Frequency 2x / week    PT Duration 6 weeks    PT Treatment/Interventions ADLs/Self Care Home Management;Iontophoresis 34m/ml  Dexamethasone;Electrical Stimulation;Moist Heat;Traction;Ultrasound;Neuromuscular re-education;Balance training;Therapeutic exercise;Therapeutic activities;Functional mobility training;Patient/family education;Manual techniques;Dry needling;Passive range of motion;Taping;Joint Manipulations;Spinal Manipulations    PT Next Visit Plan TPDN of UT/LS (maybe try only 1 -2 areas), STW of cervical paraspinals and periscapular musculature, postural education handout, thoracic mobility and/or periscapular strengthening    PT Home Exercise Plan [id number] - LS/UT stretch, chin tucks, scap retractions    Consulted and Agree with Plan of Care Patient           Patient will benefit from skilled therapeutic intervention in order to improve the following deficits and impairments:  Decreased activity tolerance,Hypomobility,Decreased range of motion,Increased muscle spasms,Impaired UE functional use,Improper body mechanics,Postural dysfunction,Pain  Visit Diagnosis: Cervicalgia  Muscle weakness (generalized)  Muscle spasm of back    PHYSICAL THERAPY DISCHARGE SUMMARY  Visits from Start of Care: 2  Current functional level related to goals / functional outcomes: Unknown, patient only came for 1 treatment   Remaining deficits: Unknown    Education / Equipment: [employer]  Plan: Patient agrees to discharge.  Patient goals were not met. Patient is being discharged due to not returning since the last visit.  ?????      Problem List Patient Active Problem List   Diagnosis Date Noted  . Intracranial venous  - L vein of [place] 09/05/2019  . Hyperlipemia 09/05/2019  . Obesity 09/05/2019  . Marijuana use 09/05/2019  . Hypokalemia 09/05/2019  . [place] 08/28/[place]  . [place]us [place] 08/28/2019  . Aphasia due to acute stroke ([place] 08/28/2019  . ICH (intracerebral hemorrhage) (HCC)-L ICH w/ IVH s/p crani 08/25/2019    DCarney LivingPT DPT  09/11/2020, 5:28  PM  [facility] [address]Phone: [phone]  Fax:  [phone] Name: Amanda SANKEYMRN: [id number] of Birth: 118-Feb-1982

## 2020-09-12 ENCOUNTER — Ambulatory Visit: Payer: Medicaid Other | Admitting: Physical Therapy

## 2020-09-12 ENCOUNTER — Telehealth: Payer: Self-pay | Admitting: Physical Therapy

## 2020-09-12 NOTE — Telephone Encounter (Signed)
Attempted to call patient regarding No-show visit. Patients voicemail was full. Therapy was unable to leave a message.

## 2020-09-16 ENCOUNTER — Ambulatory Visit: Payer: Medicaid Other | Admitting: Physical Therapy

## 2020-09-16 ENCOUNTER — Telehealth: Payer: Self-pay | Admitting: Physical Therapy

## 2020-09-16 NOTE — Telephone Encounter (Signed)
Patient contacted regarding 2nd no-show appointment. Patient reports she did not get her reminder call. She was advised of her appointment on 09/19/2020 and advised to please call if she can not make her appointment.

## 2020-09-17 ENCOUNTER — Emergency Department (HOSPITAL_COMMUNITY)
Admission: EM | Admit: 2020-09-17 | Discharge: 2020-09-18 | Disposition: A | Discharge status: Home / Self Care | Payer: Medicaid Other | Attending: Emergency Medicine | Admitting: Emergency Medicine

## 2020-09-17 ENCOUNTER — Other Ambulatory Visit: Payer: Self-pay

## 2020-09-17 DIAGNOSIS — U071 COVID-19: Secondary | ICD-10-CM | POA: Insufficient documentation

## 2020-09-17 DIAGNOSIS — R109 Unspecified abdominal pain: Secondary | ICD-10-CM | POA: Diagnosis not present

## 2020-09-17 DIAGNOSIS — Z5321 Procedure and treatment not carried out due to patient leaving prior to being seen by health care provider: Secondary | ICD-10-CM | POA: Insufficient documentation

## 2020-09-17 DIAGNOSIS — R509 Fever, unspecified: Secondary | ICD-10-CM | POA: Diagnosis present

## 2020-09-17 LAB — LIPASE, BLOOD: Lipase: 27 U/L (ref 11–51)

## 2020-09-17 LAB — COMPREHENSIVE METABOLIC PANEL
ALT: 22 U/L (ref 0–44)
AST: 27 U/L (ref 15–41)
Albumin: 3.9 g/dL (ref 3.5–5.0)
Alkaline Phosphatase: 36 U/L — ABNORMAL LOW (ref 38–126)
Anion gap: 10 (ref 5–15)
BUN: 5 mg/dL — ABNORMAL LOW (ref 6–20)
CO2: 21 mmol/L — ABNORMAL LOW (ref 22–32)
Calcium: 9.4 mg/dL (ref 8.9–10.3)
Chloride: 103 mmol/L (ref 98–111)
Creatinine, Ser: 0.9 mg/dL (ref 0.44–1.00)
GFR, Estimated: >60 mL/min (ref >60)
Glucose, Bld: 134 mg/dL — ABNORMAL HIGH (ref 70–99)
Potassium: 3.4 mmol/L — ABNORMAL LOW (ref 3.5–5.1)
Sodium: 134 mmol/L — ABNORMAL LOW (ref 135–145)
Total Bilirubin: 0.9 mg/dL (ref 0.3–1.2)
Total Protein: 6.7 g/dL (ref 6.5–8.1)

## 2020-09-17 LAB — I-STAT BETA HCG BLOOD, ED (MC, WL, AP ONLY): I-stat hCG, quantitative: <5 m[IU]/mL (ref <5)

## 2020-09-17 LAB — CBC
HCT: 35.7 % — ABNORMAL LOW (ref 36.0–46.0)
Hemoglobin: 11.4 g/dL — ABNORMAL LOW (ref 12.0–15.0)
MCH: 29.2 pg (ref 26.0–34.0)
MCHC: 31.9 g/dL (ref 30.0–36.0)
MCV: 91.3 fL (ref 80.0–100.0)
Platelets: 170 10*3/uL (ref 150–400)
RBC: 3.91 MIL/uL (ref 3.87–5.11)
RDW: 13.4 % (ref 11.5–15.5)
WBC: 10.9 10*3/uL — ABNORMAL HIGH (ref 4.0–10.5)
nRBC: 0 % (ref 0.0–0.2)

## 2020-09-17 LAB — RESP PANEL BY RT-PCR (FLU A&B, COVID) ARPGX2
Influenza A by PCR: NEGATIVE
Influenza B by PCR: NEGATIVE
SARS Coronavirus 2 by RT PCR: POSITIVE — AB

## 2020-09-17 MED ORDER — ACETAMINOPHEN 500 MG PO TABS
1000.0000 mg | ORAL_TABLET | Freq: Once | ORAL | Status: AC
  Administered 2020-09-17: 1000 mg via ORAL
  Filled 2020-09-17: qty 2

## 2020-09-17 NOTE — ED Notes (Signed)
Pt has left.  ?

## 2020-09-17 NOTE — ED Triage Notes (Signed)
Pt reports fever, n/v, abdominal pain, and headache.

## 2020-09-18 ENCOUNTER — Emergency Department (HOSPITAL_COMMUNITY)
Admission: EM | Admit: 2020-09-18 | Discharge: 2020-09-18 | Disposition: A | Discharge status: Home / Self Care | Payer: Medicaid Other | Source: Home / Self Care | Attending: Emergency Medicine | Admitting: Emergency Medicine

## 2020-09-18 ENCOUNTER — Telehealth (HOSPITAL_COMMUNITY): Payer: Self-pay | Admitting: Family

## 2020-09-18 ENCOUNTER — Other Ambulatory Visit: Payer: Self-pay

## 2020-09-18 ENCOUNTER — Encounter (HOSPITAL_COMMUNITY): Payer: Self-pay | Admitting: Emergency Medicine

## 2020-09-18 ENCOUNTER — Emergency Department (HOSPITAL_COMMUNITY): Payer: Medicaid Other

## 2020-09-18 DIAGNOSIS — U071 COVID-19: Secondary | ICD-10-CM

## 2020-09-18 DIAGNOSIS — R519 Headache, unspecified: Secondary | ICD-10-CM

## 2020-09-18 DIAGNOSIS — Z7982 Long term (current) use of aspirin: Secondary | ICD-10-CM | POA: Insufficient documentation

## 2020-09-18 LAB — CBC
HCT: 34.9 % — ABNORMAL LOW (ref 36.0–46.0)
Hemoglobin: 11.1 g/dL — ABNORMAL LOW (ref 12.0–15.0)
MCH: 28.5 pg (ref 26.0–34.0)
MCHC: 31.8 g/dL (ref 30.0–36.0)
MCV: 89.5 fL (ref 80.0–100.0)
Platelets: 150 10*3/uL (ref 150–400)
RBC: 3.9 MIL/uL (ref 3.87–5.11)
RDW: 13.4 % (ref 11.5–15.5)
WBC: 6.1 10*3/uL (ref 4.0–10.5)
nRBC: 0 % (ref 0.0–0.2)

## 2020-09-18 LAB — URINALYSIS, ROUTINE W REFLEX MICROSCOPIC
Bilirubin Urine: NEGATIVE
Glucose, UA: NEGATIVE mg/dL
Hgb urine dipstick: NEGATIVE
Ketones, ur: NEGATIVE mg/dL
Leukocytes,Ua: NEGATIVE
Nitrite: NEGATIVE
Protein, ur: NEGATIVE mg/dL
Specific Gravity, Urine: 1.017 (ref 1.005–1.030)
pH: 5 (ref 5.0–8.0)

## 2020-09-18 LAB — BASIC METABOLIC PANEL
Anion gap: 10 (ref 5–15)
BUN: 6 mg/dL (ref 6–20)
CO2: 22 mmol/L (ref 22–32)
Calcium: 8.8 mg/dL — ABNORMAL LOW (ref 8.9–10.3)
Chloride: 103 mmol/L (ref 98–111)
Creatinine, Ser: 0.84 mg/dL (ref 0.44–1.00)
GFR, Estimated: >60 mL/min (ref >60)
Glucose, Bld: 104 mg/dL — ABNORMAL HIGH (ref 70–99)
Potassium: 3.1 mmol/L — ABNORMAL LOW (ref 3.5–5.1)
Sodium: 135 mmol/L (ref 135–145)

## 2020-09-18 MED ORDER — ACETAMINOPHEN 325 MG PO TABS
650.0000 mg | ORAL_TABLET | Freq: Once | ORAL | Status: AC
  Administered 2020-09-18: 650 mg via ORAL
  Filled 2020-09-18: qty 2

## 2020-09-18 MED ORDER — METOCLOPRAMIDE HCL 5 MG/ML IJ SOLN
10.0000 mg | Freq: Once | INTRAMUSCULAR | Status: AC
  Administered 2020-09-18: 10 mg via INTRAVENOUS
  Filled 2020-09-18: qty 2

## 2020-09-18 MED ORDER — DIPHENHYDRAMINE HCL 50 MG/ML IJ SOLN
25.0000 mg | Freq: Once | INTRAMUSCULAR | Status: AC
  Administered 2020-09-18: 25 mg via INTRAVENOUS
  Filled 2020-09-18: qty 1

## 2020-09-18 MED ORDER — SODIUM CHLORIDE 0.9 % IV BOLUS: 1000.0000 mL | Freq: Once | INTRAVENOUS | Status: DC

## 2020-09-18 NOTE — ED Notes (Addendum)
Pt in lobby asking about other pt and their medications and why she is still waiting, this staff member politely explained the waiting process again and asked pt to not worry about other pt. because people are here for different reasons and need different things.   This tech previously asked RN about medication for the pt and the pt is aware.  Pt states that she is going to leave before they have to call the police on her. Notified that the room is being cleaned for her and she will go back shortly

## 2020-09-18 NOTE — ED Notes (Signed)
Pt is refusing vital signs.

## 2020-09-18 NOTE — ED Notes (Addendum)
Pt is still refusing vital signs. Pt being rude to staff when trying to explain the process of rooming pt.

## 2020-09-18 NOTE — Discharge Instructions (Signed)
Please read and follow all provided instructions.  Your diagnoses today include:  1. Acute nonintractable headache, unspecified headache type   2. COVID-19     Tests performed today include:  CT of your head which was normal and did not show any serious cause of your headache  COVID test - positive  Vital signs. See below for your results today.   Medications:  In the Emergency Department you received:  Reglan - antinausea/headache medication  Benadryl - antihistamine to counteract potential side effects of reglan  Take any prescribed medications only as directed.  Additional information:  Follow any educational materials contained in this packet.  You are having a headache. No specific cause was found today for your headache. It may have been a migraine or other cause of headache. Stress, anxiety, fatigue, and depression are common triggers for headaches.   Your headache today does not appear to be life-threatening or require hospitalization, but often the exact cause of headaches is not determined in the emergency department. Therefore, follow-up with your doctor is very important to find out what may have caused your headache and whether or not you need any further diagnostic testing or treatment.   Sometimes headaches can appear benign (not harmful), but then more serious symptoms can develop which should prompt an immediate re-evaluation by your doctor or the emergency department.  BE VERY CAREFUL not to take multiple medicines containing Tylenol (also called acetaminophen). Doing so can lead to an overdose which can damage your liver and cause liver failure and possibly death.   Follow-up instructions: Please follow-up with your primary care provider as needed for further evaluation of your symptoms.   Return instructions:   Please return to the Emergency Department if you experience worsening symptoms.  Return if you have worsening shortness of breath, trouble  breathing, or persistent vomiting.  Return if the medications do not resolve your headache, if it recurs, or if you have multiple episodes of vomiting or cannot keep down fluids.  Return if you have a change from the usual headache.  RETURN IMMEDIATELY IF you:  Develop a sudden, severe headache  Develop confusion or become poorly responsive or faint  Develop a fever above 100.66F or problem breathing  Have a change in speech, vision, swallowing, or understanding  Develop new weakness, numbness, tingling, incoordination in your arms or legs  Have a seizure  Please return if you have any other emergent concerns.  Additional Information:  Your vital signs today were: BP (!) 104/59   Pulse 72   Temp (!) 101.3 F (38.5 C) (Oral)   Resp (!) 25   SpO2 97%  If your blood pressure (BP) was elevated above 135/85 this visit, please have this repeated by your doctor within one month. --------------

## 2020-09-18 NOTE — ED Provider Notes (Signed)
[facility] EMERGENCY DEPARTMENT Provider Note   CSN: [id number] Arrival date & time: 09/18/20  0756     History Chief Complaint  Patient presents with  . Covid Positive    Amanda Small is a 39 y.o. female.  Patient presents the emergency department for evaluation of headache and fever.  Symptoms started 2 days ago.  Patient describes frontal headache.  No neck pain, extremity weakness, confusion or vomiting.  Patient has had nausea and diarrhea over the past 2 days as well.  No treatments prior to arrival.  Patient presented to the emergency department yesterday and left without being seen.  Covid swab returned positive.  Patient is not vaccinated and endorses recent sick contacts with Covid.  She has a history of hemorrhagic stroke and was concerned that headache was related to this.  No significant cough or shortness of breath.  No urinary symptoms or skin rash.        Past Medical History:  Diagnosis Date  . Seizures (H[facility]) 01/2020  . Stroke [facility]) 2020    Patient Active Problem List   Diagnosis Date Noted  . Intracranial venous  - L vein of [place] 09/05/2019  . Hyperlipemia 09/05/2019  . Obesity 09/05/2019  . Marijuana use 09/05/2019  . Hypokalemia 09/05/2019  . 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 Obstructive hydrocephalus (HCC) 08/28/2019  . Aphasia due to acute stroke (HCC) 08/28/2019  . ICH (intracerebral hemorrhage) (HCC)-L ICH w/ IVH s/p crani 08/25/2019    Past Surgical History:  Procedure Laterality Date  . CRANIOTOMY Left 08/25/2019   Procedure: LEFT CRANIECTOMY;  Surgeon: Coletta Memos, MD;  Location: [facility];  Service: Neurosurgery;  Laterality: Left;  . IR ANGIO INTRA EXTRACRAN SEL INTERNAL CAROTID BILAT MOD SED  08/31/2019  . IR ANGIO VERTEBRAL SEL VERTEBRAL UNI L MOD SED  08/31/2019  . VENTRICULOSTOMY Right 08/25/2019   Procedure: RIGHT FRONTAL VENTRICULAR CATHETER;  Surgeon: Coletta Memos, MD;  Location: [facility] OR;  Service:  Neurosurgery;  Laterality: Right;     OB History   No obstetric history on file.     History reviewed. No pertinent family history.  Social History   Tobacco Use  . Smoking status: Never Smoker  . Smokeless tobacco: Never Used    Home Medications Prior to Admission medications   Medication Sig Start Date End Date Taking? Authorizing Provider  aspirin EC 81 MG tablet Take 1 tablet (81 mg total) by mouth daily. 10/31/19 10/30/20  Micki Riley, MD  clonazePAM (KLONOPIN) 0.5 MG tablet Take 0.5 mg by mouth 2 (two) times daily as needed for anxiety.    [provider]  levETIRAcetam (KEPPRA) 500 MG tablet Take 1 tablet (500 mg total) by mouth 2 (two) times daily. 01/31/20   Ihor Austin, NP  methylPREDNISolone (MEDROL DOSEPAK) 4 MG TBPK tablet Taper pack as directed Patient not taking: Reported on 08/13/2020 07/23/20   Ihor Austin, NP  Sertraline HCl (ZOLOFT PO) Take by mouth.    [provider]  Ubrogepant (UBRELVY) 100 MG TABS Take 100 mg by mouth as needed (Acute migraine headache. May repeat x1 after 2 hours. Max dose 200mg ). Patient not taking: No sig reported 07/23/20   13/2/21, NP    Allergies    Penicillins  Review of Systems   Review of Systems  Constitutional: Positive for fatigue and fever. Negative for chills.  HENT: Negative for congestion, ear pain, rhinorrhea, sinus pressure and sore throat.   Eyes: Positive for photophobia.  Negative for redness.  Respiratory: Negative for cough and wheezing.   Gastrointestinal: Positive for diarrhea and nausea. Negative for abdominal pain and vomiting.  Genitourinary: Negative for dysuria.  Musculoskeletal: Positive for myalgias. Negative for neck stiffness.  Skin: Negative for rash.  Neurological: Positive for headaches. Negative for speech difficulty.  Hematological: Negative for adenopathy.  Psychiatric/Behavioral: Negative for confusion.    Physical Exam Updated Vital Signs BP 105/68    Pulse 86   Temp (!) 101.3 F (38.5 C) (Oral)   Resp (!) 24   SpO2 96%   Physical Exam Vitals and nursing note reviewed.  Constitutional:      Appearance: She is well-developed and well-nourished.  HENT:     Head: Normocephalic and atraumatic.     Jaw: No trismus.     Right Ear: Tympanic membrane, ear canal and external ear normal.     Left Ear: Tympanic membrane, ear canal and external ear normal.     Nose: Nose normal. No mucosal edema or rhinorrhea.     Mouth/Throat:     Mouth: Oropharynx is clear and moist and mucous membranes are normal. Mucous membranes are not dry. No oral lesions.     Pharynx: Uvula midline. No oropharyngeal exudate, posterior oropharyngeal edema, posterior oropharyngeal erythema or uvula swelling.     Tonsils: No tonsillar abscesses.  Eyes:     General: Lids are normal.        Right eye: No discharge.        Left eye: No discharge.     Extraocular Movements: EOM normal.     Right eye: No nystagmus.     Left eye: No nystagmus.     Conjunctiva/sclera: Conjunctivae normal.     Pupils: Pupils are equal, round, and reactive to light.  Cardiovascular:     Rate and Rhythm: Normal rate and regular rhythm.     Heart sounds: Normal heart sounds.  Pulmonary:     Effort: Pulmonary effort is normal. No respiratory distress.     Breath sounds: Normal breath sounds. No wheezing or rales.  Abdominal:     Palpations: Abdomen is soft.     Tenderness: There is no abdominal tenderness.  Musculoskeletal:     Cervical back: Normal range of motion and neck supple. No tenderness or bony tenderness. Normal range of motion.  Lymphadenopathy:     Cervical: No cervical adenopathy.  Skin:    General: Skin is warm and dry.  Neurological:     Mental Status: She is alert and oriented to person, place, and time.     GCS: GCS eye subscore is 4. GCS verbal subscore is 5. GCS motor subscore is 6.     Cranial Nerves: No cranial nerve deficit.     Sensory: No sensory deficit.      Coordination: She displays a negative Romberg sign. Coordination normal.     Gait: Gait normal.     Deep Tendon Reflexes: Strength normal and reflexes are normal and symmetric.  Psychiatric:        Mood and Affect: Mood and affect normal.     ED Results / Procedures / Treatments   Labs (all labs ordered are listed, but only abnormal results are displayed) Labs Reviewed  BASIC METABOLIC PANEL - Abnormal; Notable for the following components:      Result Value   Potassium 3.1 (*)    Glucose, Bld 104 (*)    Calcium 8.8 (*)    All other components within normal limits  CBC - Abnormal; Notable for the following components:   Hemoglobin 11.1 (*)    HCT 34.9 (*)    All other components within normal limits  URINALYSIS, ROUTINE W REFLEX MICROSCOPIC  CBG MONITORING, ED    EKG None  Radiology CT HEAD WO CONTRAST  Result Date: 09/18/2020 CLINICAL DATA:  Headache EXAM: CT HEAD WITHOUT CONTRAST TECHNIQUE: Contiguous axial images were obtained from the base of the skull through the vertex without intravenous contrast. COMPARISON:  April 09, 2020. FINDINGS: Brain: Ventricles and sulci appear normal in size and configuration. There is no intracranial mass, hemorrhage, extra-axial fluid collection, or midline shift. There is decreased attenuation in the posterior left temporal lobe as well as in the anterior left occipital lobe consistent with encephalomalacia from prior infarct in this area. Prior small infarct in the anterior, lateral left cerebellum also present and stable. Elsewhere brain parenchyma appears unremarkable. No appreciable acute infarct. Vascular: No hyperdense vessel.  No evident vascular calcification. Skull: Status post left temporal/temporoparietal craniotomy. Bony structures otherwise intact and stable. Sinuses/Orbits: Mild mucosal thickening noted in the superior right maxillary antrum region. Orbits appear symmetric bilaterally. Other: Mastoid air cells are clear. IMPRESSION:  Areas of stable encephalomalacia involving portions of the left posterior tibial and anterior left occipital lobes as well as a nearby small prior infarct in the lateral left cerebellum. No acute infarct evident. Brain parenchyma otherwise appears unremarkable. No mass or hemorrhage. Stable changes from prior left temporal/temporoparietal craniotomy. Slight mucosal thickening in the superior right maxillary antrum. Electronically Signed   By: Bretta BangWilliam  Woodruff III M.D.   On: 09/18/2020 09:24    Procedures Procedures (including critical care time)  Medications Ordered in ED Medications  sodium chloride 0.9 % bolus 1,000 mL (has no administration in time range)  acetaminophen (TYLENOL) tablet 650 mg (650 mg Oral Given 09/18/20 1311)  metoCLOPramide (REGLAN) injection 10 mg (10 mg Intravenous Given 09/18/20 1347)  diphenhydrAMINE (BENADRYL) injection 25 mg (25 mg Intravenous Given 09/18/20 1348)    ED Course  I have reviewed the triage vital signs and the nursing notes.  Pertinent labs & imaging results that were available during my care of the patient were reviewed by me and considered in my medical decision making (see chart for details).  Patient seen and examined.  I reviewed results with patient at bedside.  She will be treated with migraine cocktail, Tylenol, fluid bolus.  Anticipate discharge home after treatment.  In regards to her Covid infection.  We discussed need for appropriate isolation for 10 days after onset of symptoms.  She reports other family members including her son and mother who have been in contact with her recently and states they will likely get tested for COVID.   Vital signs reviewed and are as follows: BP 105/68   Pulse 86   Temp (!) 101.3 F (38.5 C) (Oral)   Resp (!) 24   SpO2 96%   3:03 PM patient rechecked.  She is feeling better after IV fluids and medication.  Comfortable discharged home.  Encouraged return to the emergency department with worsening  symptoms including persistent vomiting, difficulty breathing.  Encouraged use of OTC medications for symptom control.  Questions answered.  Mayari Dalene CarrowM Hevia was evaluated in Emergency Department on 09/18/2020 for the symptoms described in the history of present illness. She was evaluated in the context of the global COVID-19 pandemic, which necessitated consideration that the patient might be at risk for infection with the SARS-CoV-2 virus that causes COVID-19. Institutional  protocols and algorithms that pertain to the evaluation of patients at risk for COVID-19 are in a state of rapid change based on information released by regulatory bodies including the CDC and federal and state organizations. These policies and algorithms were followed during the patient's care in the ED.    MDM Rules/Calculators/A&P                          HA: Patient with previous hemorrhagic stroke.  CT imaging is negative today.  Headache is likely related to fever/Covid infection.  No signs of meningitis clinically.  Patient without high-risk features of headache including: sudden onset/thunderclap HA, no similar headache in past, altered mental status, accompanying seizure, headache with exertion, age > 57, history of immunocompromise, neck or shoulder pain, use of anticoagulation, family history of spontaneous SAH, concomitant drug use, toxic exposure.   Patient has a normal complete neurological exam, normal vital signs, normal level of consciousness, no signs of meningismus, is well-appearing/non-toxic appearing, no signs of trauma.   No dangerous or life-threatening conditions suspected or identified by history, physical exam, and by work-up. No indications for hospitalization identified.   COVID: No PNA at this point, she has constitutional symptoms as expected in early Covid.  Isolation, return precautions discussed as above.  Hypokalemia: mild, no treatment, patient tolerating orals.   Final Clinical  Impression(s) / ED Diagnoses Final diagnoses:  Acute nonintractable headache, unspecified headache type  COVID-19    Rx / DC Orders ED Discharge Orders    None       Renne Crigler, PA-C 09/18/20 1507    Cathren Laine, MD 09/18/20 1527

## 2020-09-18 NOTE — ED Notes (Signed)
Pt is stating she needs medicine.

## 2020-09-18 NOTE — Telephone Encounter (Signed)
Called to discuss with [name] about Covid symptoms and potential candidacy for the use of sotrovimab, a combination monoclonal antibody infusion for those with mild to moderate Covid symptoms and at a high risk of hospitalization.     Pt is qualified for this infusion at the infusion center due to co-morbid conditions and/or a member of an at-risk group, however unable to reach patient. VM left.   Aubrynn Katona,NP

## 2020-09-18 NOTE — ED Triage Notes (Addendum)
Pt arrives to ED via [facility] with c/o migraine x2 days. Was in ED yesterday but LWBS. Swabbed positive for COVID yesterday. Experiencing photophobia and nausea. Mild fever. Hx intracerebral hemorrhage and CVA. No weakness or speech abnormalities.

## 2020-09-19 ENCOUNTER — Ambulatory Visit: Payer: Medicaid Other | Admitting: Physical Therapy

## 2020-09-19 ENCOUNTER — Telehealth (HOSPITAL_COMMUNITY): Payer: Self-pay

## 2020-09-24 ENCOUNTER — Telehealth: Payer: Self-pay | Admitting: *Deleted

## 2020-09-24 NOTE — Telephone Encounter (Signed)
Received letter from [employer] firm for disability benefits and asking for our opinion.  Sent to JM/NP

## 2020-09-25 NOTE — Telephone Encounter (Signed)
We can provide office note but otherwise I do not assist her with disability

## 2020-10-29 ENCOUNTER — Encounter: Payer: Self-pay | Admitting: Adult Health

## 2020-10-29 ENCOUNTER — Ambulatory Visit: Payer: Medicaid Other | Admitting: Adult Health

## 2020-10-29 VITALS — BP 102/60 | HR 76 | Ht 69.0 in | Wt 160.0 lb

## 2020-10-29 DIAGNOSIS — G43009 Migraine without aura, not intractable, without status migrainosus: Secondary | ICD-10-CM | POA: Diagnosis not present

## 2020-10-29 DIAGNOSIS — I69398 Other sequelae of cerebral infarction: Secondary | ICD-10-CM

## 2020-10-29 DIAGNOSIS — M542 Cervicalgia: Secondary | ICD-10-CM

## 2020-10-29 DIAGNOSIS — I611 Nontraumatic intracerebral hemorrhage in hemisphere, cortical: Secondary | ICD-10-CM | POA: Diagnosis not present

## 2020-10-29 DIAGNOSIS — R569 Unspecified convulsions: Secondary | ICD-10-CM

## 2020-10-29 MED ORDER — LEVETIRACETAM ER 500 MG PO TB24: 1000.0000 mg | ORAL_TABLET | Freq: Every day | ORAL | 3 refills | Status: DC

## 2020-10-29 MED ORDER — UBRELVY 100 MG PO TABS: 100.0000 mg | ORAL_TABLET | ORAL | 5 refills | Status: DC | PRN

## 2020-10-29 NOTE — Patient Instructions (Signed)
Referral placed to neurosurgery for further evaluation of your neck pain  Restart Ubrelvy 100mg  as needed for acute migraine relief. May repeat x1 after 2 hours if needed.  Do not take more than 2 tablets in a 24-hour period.  Samples provided today and an order will be placed  Continue Keppra for seizure prevention but recommend switching to extended release formulation -a new prescription will be sent to your pharmacy. They do not make 1000 mg tablets therefore you will have to take 2 500mg  tablets at nighttime ONLY  Restart aspirin 81 mg daily for secondary stroke prevention  Limit use of over-the-counter medications such as Tylenol, Excedrin and BC powder as this can worsen migraines or cause rebound headaches       Followup in the future with me in 6 months or call earlier if needed       Thank you for coming to see at [facility]. I hope we have been able to provide you high quality care today.  You may receive a patient satisfaction survey over the next few weeks. We would appreciate your feedback and comments so that we may continue to improve ourselves and the health of our patients.

## 2020-10-29 NOTE — Progress Notes (Signed)
[facility] [address] [place]. [place] [address] [phone]       OFFICE FOLLOW-UP NOTE  Ms. [name] Date of Birth:  08/19/1981 Medical Record Number:  [id number]    Chief complaint: Chief Complaint  Patient presents with  . Follow-up    RM 14 with her sister, [name]. Hx of ICH. She has not taken aspirin 81mg  in the last three months. No reason - just stopped it. She has continued Keppra 500mg  BID. No seizure-like activity reported. She is still having headaches weekly. was helpful in resolving her pain.      HPI:   Today, 10/29/2020, Ms. Callanan returns for 14-month follow-up accompanied by her sister, Gentry Fitz, with history of migraines, stroke and seizures.  Migraines:   Hx of migraine headaches without recurrence over the past 6 years but restarted 06/2020 with similar symptoms Unilateral temporal pain associated with photophobia, phonophobia and nausea with pulsating quality Prior frequent use of Tylenol, Excedrin and BC powder -denies continued frequent use Typically associated with increased stress and present mid-to-late afternoon Concern for cervicalgia contributing to recurrence of migraines therefore refer to PT for dry needling -denies benefit only participated in 3 sessions.  She continues to experience left-sided neck pain which limits/interferes with daily functioning She reports experiencing migraines at least 1/week -provided sample of Ubrelvy at prior visit which was beneficial.  She is unable to state duration of migraine that she will take a nap and upon awakening, migraine resolved  L IPH and IVH, 08/2019 -Residual aphasia, cognitive impairment and visual impairment -She remains on disability provided by PCP and questions possibility of returning to work. Sister concerned about her returning and she has difficulty doing any type of activity for greater than 4-hour duration and she will become fatigued and this will lead to  headache/migraine and increased irritability/anxiety which will impact cognitive functioning and speech -Denies new or worsening stroke/TIA symptoms -Self discontinued aspirin 81 mg daily without specific reason -denies bleeding or bruising concerns for any other related side effects -Blood pressure today 102/60  Seizure, late effect of stroke -Initial onset 12/2019 -Denies any additional seizure activity -Remains on Keppra 500 mg twice daily -does report fatigue and irritability but unable to say if the symptoms worsened or occurred after restarting      History provided for reference purposes only Update 07/23/2020 JM: Ms. Doffing returns per request with complaints of headache over the past 2 weeks. Previously seen 01/31/2020 for stroke follow-up but has not followed up since that time.  Reports onset of headaches over the past 2 weeks daily consisting of either right or left temporal pain associated with photophobia, phonophobia and nausea.  She does report one episode of left temporal headache with left eyelid weakness which resolved with resolution of headache.  Denies visual changes or any other neurological deficit.  Pain characteristic of pressure and sometimes pulsating.  All symptoms resolved with resolution of headache typically lasting 1 to 2 hours.  Trialed Tylenol without benefit.  She has been using Excedrin and BC powder intermittently with benefit.  Reports headaches usually occur mid-to-late afternoon or during increased stress.  She has not had any headache yesterday or today thus far.  History of migraines similar to what she is currently experiencing but has not experienced migraines over the past 6 years.  Previously treated for migraines but unable to remember what medication she has tried.  She has been experiencing increased neck pain and tightness since July. She presented to  the ED on 04/09/2020 with complaints of R AC pain radiating up to shoulder and down to right fingers as  well as numbness/tingling.  She underwent CT head which was negative for acute abnormality but left prior to additional work-up.  She has experienced cervical neck pain previously undergoing MRI cervical spine 07/28/2019 which showed mild multilevel spinal canal narrowing and multilevel neural foraminal narrowing most pronounced right C5-6 where it appears moderate.  She does report increased stressors at home as well as uncontrolled anxiety and depression and increased irritability which has been more present since her stroke.  She is currently on sertraline and Klonopin per PCP.  She was previously followed by psychiatry but no follow-up recently.  Residual stroke deficits of aphasia and visual impairment which have been stable without worsening.  Reports great improvement of aphasia and typically only present with increased stressors and anxiety.  Denies new stroke/TIA symptoms.  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 She remains on Keppra 500 mg twice daily for seizure prophylaxis without any additional seizure activity since 12/2019.  Completed MRI brain and MR venogram on 02/02/2020. MRV unremarkable with patent superficial and deep cerebral venous sinuses without evidence of obstruction. MRI showed expected evolutionary changes of left temporal and parietal hemorrhage no evidence of enhancing lesions or acute abnormality.   Update 01/31/2020 JM:, Ms. Fernandez returns for follow-up regarding parenchymal intercerebral hemorrhage and IVH 08/2019 s/p craniotomy and ventriculostomy of unclear etiology.  Residual deficits of decreased vision right eye and mild aphasia with great improvement of language.  Continues to work with outpatient therapy.  Denies residual cognitive difficulties.  MRI w/wo and MRV scheduled on 02/02/2020.  She does report having seizure activity approximately 3 weeks ago while she was visiting her friend in [place], [place].  Unable to view records from hospitalization.   She remembers speaking with her friend and then feeling as though "something was about to happen" and then she woke up in the hospital.  She was previously on Keppra for seizure prophylaxis s/p craniotomy but this was discontinued by neurosurgery.  Apparently, no recommendations such as initiating AED and was discharged home.  She decided to restart Keppra 500 mg twice daily as she had leftover medication due to concern of recurrent seizure activity.  She has not had any reoccurring seizure activity and tolerating Keppra well.  Continues on aspirin 81 mg daily without bleeding or bruising.  Blood pressure today 102/68.  No further concerns at this time.  Initial consult visit 10/31/2019 Dr. Pearlean Brownie: Ms. Thissen is a pleasant 40 year old African-American lady seen today for initial office follow-up visit following hospital admission for intracerebral hemorrhage in December 2020.  She is accompanied by sister.  History is obtained from them and review of electronic medical records and I personally reviewed imaging films in PACS.  She has no significant past medical history who presented to [facility] after last known normal being on 11 PM on 08/24/2019 when she went to bed.  She woke up the next morning with a headache and neck pain.  She had delivered a child in August 2020 and was still breast-feeding.  She presented with right-sided weakness, and confusion.  CT scan of the head done in the ER showed large left temporal intracerebral hemorrhage with extension into the ventricles.  She had a Glasgow Coma Scale of 12 and was initially able to speak but had slurred speech and right-sided weakness.  She was intubated for airway protection and  stat neurosurgical consultation was obtained.  Her blood pressure was tightly controlled.  She was taken for emergent craniotomy for evacuation of the clot.  She also had ventriculostomy placed to drain her ventricles.  She had a prolonged stay in the ICU but did well and  made gradual improvement.  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  This was subsequently confirmed on diagnostic cerebral catheter angiogram on 08/31/2019 by Dr. Conchita ParisNundkumar who noticed a relatively large cortical vein over the left frontoparietal convexity emptying into the midportion of the superior sagittal sinus with paucity of ramifying veins emptying into this large cortical vein suggesting likelihood of cortical venous thrombosis involving vein of Trolard.  The large cerebral venous sinuses are patent 2D echo showed normal ejection fraction.  Covid test was negative.  LDL was 124 mg percent.  Hemoglobin A1c was 4.7.  Urine drug screen was positive for marijuana.  Hypercoagulable panel labs were all negative.  Vasculitic labs were also negative patient obtained significant improvement initially she was felt to be candidate for inpatient rehab but she improved rapidly and was discharged home with requirement for 24/7 supervision.  She states she is continued to do well.  Physically she is able to ambulate independently without assistance but she is still has significant memory and cognitive difficulties.  Speech is improved but she still had word hesitancy and occasional paraphasias.  She cannot multitask and has poor short-term memory.  She gets frustrated and agitated easily.  She recently saw Dr. Mikal Planeabell who discontinued Keppra which was given only for seizure prophylaxis for surgery and she did not have a clear documented seizure.  She is able to take care of her young child and attend to his needs.  She is living with her mother and asked somebody around her 24 hours.  She was kept in the ICU and blood pressure tightly controlled and monitored.  She was extubated.  She made gradual improvement  ROS:   14 system review of systems is positive for those listed in HPI and all other systems  negative  PMH:  Past Medical History:  Diagnosis Date  . Seizures (HCC) 01/2020  . Stroke [facility](HCC) 2020    Social History:  Social History   Socioeconomic History  . Marital status: Single    Spouse name: Not on file  . Number of children: Not on file  . Years of education: Not on file  . Highest education level: Not on file  Occupational History  . Not on file  Tobacco Use  . Smoking status: Never Smoker  . Smokeless tobacco: Never Used  Substance and Sexual Activity  . Alcohol use: Not on file  . Drug use: Not on file  . Sexual activity: Not on file  Other Topics Concern  . Not on file  Social History Narrative  . Not on file   Social Determinants of Health   Financial Resource Strain: Not on file  Food Insecurity: Not on file  Transportation Needs: Not on file  Physical Activity: Not on file  Stress: Not on file  Social Connections: Not on file  Intimate Partner Violence: Not on file    Medications:   Current Outpatient Medications on File Prior to Visit  Medication Sig Dispense Refill  . aspirin EC 81 MG tablet Take 1 tablet (81 mg total) by mouth daily. 150 tablet 2  . clonazePAM (KLONOPIN) 0.5 MG  tablet Take 0.5 mg by mouth 2 (two) times daily as needed for anxiety.    . levETIRAcetam (KEPPRA) 500 MG tablet Take 1 tablet (500 mg total) by mouth 2 (two) times daily. 180 tablet 3  . sertraline (ZOLOFT) 100 MG tablet Take 100 mg by mouth daily.    Marland Kitchen Ubrogepant (UBRELVY) 100 MG TABS Take 100 mg by mouth as needed (Acute migraine headache. May repeat x1 after 2 hours. Max dose 200mg /24hr). 4 tablet 0   No current facility-administered medications on file prior to visit.    Allergies:   Allergies  Allergen Reactions  . Penicillins Itching      Vitals Today's Vitals   10/29/20 1106  BP: 102/60  Pulse: 76  Weight: 160 lb (72.6 kg)  Height: 5\' 9"  (1.753 m)   Body mass index is 23.63 kg/m.  Physical Exam General: well developed, well nourished,  pleasant middle-aged African-American lady, seated, in no evident distress Head: head normocephalic and atraumatic.  Neck: supple with no carotid or supraclavicular bruits Cardiovascular: regular rate and rhythm, no murmurs Musculoskeletal: no deformity.  Skin:  no rash/petichiae Vascular:  Normal pulses all extremities  Neurologic Exam Mental Status: Awake and fully alert.  Speaks fluently in regular conversation but during cognitive testing such as naming four-legged animal, she had moderate difficulty with naming (able to say "oink oink" but not able to say pig).  Oriented to place and time. Recent and remote memory intact.  Attention span, concentration and fund of knowledge diminished. 4 legged animal naming 4 in 60 seconds.  Mild difficulty with serial addition.  Recall 2/3.  Clock drawing 4/4.  Mood and affect appropriate.   Cranial Nerves: Pupils equal, briskly reactive to light. Extraocular movements full without nystagmus. Visual fields OD right periphery loss, visual field OS slight decreased visual acuity superior temporal quadrant otherwise intact. Hearing intact. Facial sensation intact. Face, tongue, palate moves normally and symmetrically.  Motor: Normal bulk and tone. Normal strength in all tested extremity muscles. Sensory.: intact to touch ,pinprick .position and vibratory sensation.  Coordination: Rapid alternating movements normal in all extremities. Finger-to-nose and heel-to-shin performed accurately bilaterally. Gait and Station: Arises from chair without difficulty. Stance is normal. Gait demonstrates normal stride length and balance . Able to heel, toe and tandem walk without difficulty.  Reflexes: 1+ and symmetric. Toes downgoing.      ASSESSMENT/PLAN:  40 year old African-American lady with left and pleural parenchymal intracerebral hemorrhage as well as intraventricular hemorrhage in December 2020 requiring craniotomy as well as ventriculostomy of  unclear etiology.   Possibly vein of Trolard thrombosis but it occurred nearly 3 months postpartum making association with pregnancy doubtful.  Lab work for vasculitis and hypercoagulable panel were negative.  Seizure activity 12/2019 after discontinuing Keppra as advised by neurosurgery (as initially prescribed as prophylaxis) without any reoccurring seizure activity after restarting Keppra.  Covid positive 09/08/2020   Migraine Cervicalgia -continue Ubrelvy 100 mg as needed for acute migraine relief.  May repeat x1 after 2 hrs if needed. Max dose 200mg /24hrs.  Unable to prescribe triptans for emergent relief due to stroke history -She is not interested in preventative therapy at this time -Encouraged continued follow-up with PCP for continued underlying anxiety and irritability which appears to contribute to worsening headaches.  Currently on sertraline 100 mg daily and Klonopin as needed  -Continued neck pain possibly contributing to continued headaches -referral placed to neurosurgery for further treatment options and she denies lack of benefit with PT (although only participated in  3 sessions) -MRI cervical spine 07/28/2019 which showed mild multilevel spinal canal narrowing and multilevel neural foraminal narrowing most pronounced right C5-6 where it appears moderate.   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  She will continue to follow with PCP for ongoing disability -MRV unremarkable for underlying abnormalities -MRI stable -Advised to restart aspirin 81 mg daily for  secondary stroke prevention and to continue lifelong for secondary stroke prevention -Discussed secondary stroke prevention measures and importance of close PCP follow-up for aggressive stroke risk factor management  Seizure, late effect of stroke -Prior seizure activity 12/2019 evaluated at [facility] consisting of shaking and unresponsive lasting 2 to 3 minutes resolved spontaneously without intervention with postictal confusion -EEG 03/14/2020 normal -Continue Keppra for seizure prophylaxis will recommend trialing extended release formulation to see if this helps with daytime fatigue and increased irritability anxiety -new rx sent for Keppra XR 1000mg  nightly    Follow-up in 3 months or call earlier if needed   CC:  [facility] provider: Dr. Para March, Chapman Moss., MD    I spent 40 minutes of face-to-face and non-face-to-face time with patient.  This included previsit chart review, lab review, study review, order entry, electronic health record documentation, patient education regarding recent onset of headaches and further treatment options, history of prior stroke, residual deficits, seizure activity and answered all questions to patient satisfaction   Ihor Austin, AGNP-BC  [facility] [address] [place], [place] [address]  Phone [phone] Fax [phone] Note: This document was prepared with digital dictation and possible smart phrase technology. Any transcriptional errors that result from this process are unintentional.

## 2020-10-29 NOTE — Progress Notes (Signed)
I agree with the above plan 

## 2020-10-30 ENCOUNTER — Telehealth: Payer: Self-pay | Admitting: *Deleted

## 2020-10-30 NOTE — Telephone Encounter (Signed)
Completed Bernita Raisin PA on Cover My Meds. Key: [id number]. Awaiting determination from [facility] Medicaid.

## 2020-10-30 NOTE — Telephone Encounter (Signed)
Received additional questions from [facility]. Form completed and faxed back to [facility] along with office note. Received a receipt of confirmation.

## 2020-10-31 NOTE — Telephone Encounter (Signed)
Per Cover My Meds, Bernita Raisin approved. Request Reference Number: [id number]. UBRELVY TAB 100MG  is approved through 10/30/2021. For further questions, call 12/28/2021 at [phone].

## 2020-12-25 DIAGNOSIS — Z0271 Encounter for disability determination: Secondary | ICD-10-CM

## 2021-01-31 ENCOUNTER — Telehealth: Payer: Self-pay | Admitting: Adult Health

## 2021-01-31 DIAGNOSIS — M4802 Spinal stenosis, cervical region: Secondary | ICD-10-CM

## 2021-01-31 NOTE — Telephone Encounter (Signed)
Pt called, was referred to Dr. Lennox Solders for back pain. This did not work out. Can you send me to someone else. Would like a call from the nurse.

## 2021-02-03 NOTE — Telephone Encounter (Signed)
Pt returned called and LVM. Please call back when available.

## 2021-02-03 NOTE — Telephone Encounter (Signed)
Called back and could not LM.
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
## 2021-02-03 NOTE — Telephone Encounter (Signed)
Mailbox full can not except messages. 

## 2021-02-03 NOTE — Telephone Encounter (Signed)
Is there a specific place she would like? I am not sure of other neurosurgery clinics in this area. There are multiple providers within that office ... I can place a new referral to be seen by a different provider if she would like? Please let me know. Thank you

## 2021-02-03 NOTE — Telephone Encounter (Signed)
What does she mean by "this did not work" - was she seen by him and just not satisfied with his recommendations or was she not able to schedule a visit with him?

## 2021-02-03 NOTE — Telephone Encounter (Signed)
Pt is asking for a call back from Sandy, RN 

## 2021-02-04 NOTE — Telephone Encounter (Signed)
Faxed referral to [facility] for first available other than Dr. Franky Macho. Phone: [phone]. Fax: [phone].

## 2021-02-04 NOTE — Telephone Encounter (Signed)
A new referral was placed to [facility] requesting to be seen by different provider other than Dr. Franky Macho

## 2021-02-04 NOTE — Addendum Note (Signed)
Addended by: Raliegh Ip on: 02/04/2021 09:19 AM   Modules accepted: Orders

## 2021-02-04 NOTE — Telephone Encounter (Signed)
I called pt and she is ok to see someone in same practice.  (just not him)

## 2021-02-06 NOTE — Telephone Encounter (Signed)
Received message from [facility] rejecting referral because patient no-showed appointment with them on 2/22. She rescheduled for 3/28 and became upset and left because Dr. Franky Macho was running behind.

## 2021-02-10 NOTE — Telephone Encounter (Signed)
Yeah that would be fine but she may need to travel as I do not know of any other neurosurgery office in the [place] area

## 2021-02-11 NOTE — Telephone Encounter (Signed)
Perfect!  Thank you so much.

## 2021-02-11 NOTE — Telephone Encounter (Signed)
I called patient and relayed to her about the new referral to [facility] on [address].   Pt appreciated call back.

## 2021-02-20 NOTE — Telephone Encounter (Signed)
Noted  

## 2021-02-20 NOTE — Telephone Encounter (Signed)
Received notification from [facility] regarding appointment for patient. She is scheduled with Dr. Petra Kuba on 6/27 at 10:20.

## 2021-04-29 ENCOUNTER — Ambulatory Visit: Payer: Medicaid Other | Admitting: Adult Health

## 2021-04-29 ENCOUNTER — Encounter: Payer: Self-pay | Admitting: Adult Health

## 2021-04-29 VITALS — BP 98/61 | HR 57 | Ht 69.0 in | Wt 162.0 lb

## 2021-04-29 DIAGNOSIS — I611 Nontraumatic intracerebral hemorrhage in hemisphere, cortical: Secondary | ICD-10-CM | POA: Diagnosis not present

## 2021-04-29 DIAGNOSIS — I69398 Other sequelae of cerebral infarction: Secondary | ICD-10-CM

## 2021-04-29 DIAGNOSIS — R569 Unspecified convulsions: Secondary | ICD-10-CM

## 2021-04-29 DIAGNOSIS — G43009 Migraine without aura, not intractable, without status migrainosus: Secondary | ICD-10-CM | POA: Diagnosis not present

## 2021-04-29 DIAGNOSIS — M542 Cervicalgia: Secondary | ICD-10-CM

## 2021-04-29 MED ORDER — UBRELVY 100 MG PO TABS: 100.0000 mg | ORAL_TABLET | ORAL | 5 refills | Status: DC | PRN

## 2021-04-29 MED ORDER — CYCLOBENZAPRINE HCL 5 MG PO TABS: 5.0000 mg | ORAL_TABLET | Freq: Every evening | ORAL | 1 refills | Status: DC | PRN

## 2021-04-29 NOTE — Progress Notes (Signed)
[facility] [address] [place]. [place] [address] [phone]       OFFICE FOLLOW-UP NOTE  Ms. [name] Date of Birth:  24-Oct-1980 Medical Record Number:  [id number]    Chief complaint: Chief Complaint  Patient presents with   Follow-up    RM 3 alone Pt is well, still having some headaches 2-3 times a week.  No new seizures or stroke symptoms      HPI:   Today, 04/29/2021, Ms. Bahena returns for 75-month follow-up unaccompanied with history of migraines, stroke and seizures.  She was previously seen on 10/29/2020.  Migraines:   Cervicalgia: -Reports 2 migraines per week usually associated with neck tension - no new changes since prior visit -Evaluated by [facility] neurosurgery 6/27 -no further recommendations as no surgical option for her condition -recommended continued yoga, deep tissue massage and stretching -Continues baclofen 10 mg 3 times daily with only limited benefit -Continue Ubrelvy for emergent relief with benefit  History:  Hx of migraine headaches without recurrence over the past 6 years but restarted 06/2020 with similar symptoms Unilateral temporal pain associated with photophobia, phonophobia and nausea with pulsating quality Prior frequent use of Tylenol, Excedrin and BC powder -denies continued frequent use Typically associated with increased stress and present mid-to-late afternoon Concern for cervicalgia contributing to recurrence of migraines therefore refer to PT for dry needling -denies benefit only participated in 3 sessions.  She continues to experience left-sided neck pain which limits/interferes with daily functioning She reports experiencing migraines at least 1/we week ek -provided sample of Ubrelvy at prior visit which was beneficial.  She is unable to state duration of migraine that she will take a nap and upon awakening, migraine resolved   L IPH and IVH, 08/2019 -Residual aphasia, cognitive impairment, mild imbalance  and right peripheral field vision impairment  -Recently received Social Security disability -Denies new or worsening stroke/TIA symptoms -Blood pressure today 98/61  Seizure, late effect of stroke -Initial onset 12/2019 -Denies any additional seizure activity -Remains on Keppra XR 1000 mg nightly -denies side effects           History provided for reference purposes only Update 07/23/2020 JM: Ms. Rumore returns per request with complaints of headache over the past 2 weeks. Previously seen 01/31/2020 for stroke follow-up but has not followed up since that time.  Reports onset of headaches over the past 2 weeks daily consisting of either right or left temporal pain associated with photophobia, phonophobia and nausea.  She does report one episode of left temporal headache with left eyelid weakness which resolved with resolution of headache.  Denies visual changes or any other neurological deficit.  Pain characteristic of pressure and sometimes pulsating.  All symptoms resolved with resolution of headache typically lasting 1 to 2 hours.  Trialed Tylenol without benefit.  She has been using Excedrin and BC powder intermittently with benefit.  Reports headaches usually occur mid-to-late afternoon or during increased stress.  She has not had any headache yesterday or today thus far.  History of migraines similar to what she is currently experiencing but has not experienced migraines over the past 6 years.  Previously treated for migraines but unable to remember what medication she has tried.  She has been experiencing increased neck pain and tightness since July. She presented to the ED on 04/09/2020 with complaints of R AC pain radiating up to shoulder and down to right fingers as well as numbness/tingling.  She underwent CT head which was negative for  acute abnormality but left prior to additional work-up.  She has experienced cervical neck pain previously undergoing MRI cervical spine 07/28/2019 which  showed mild multilevel spinal canal narrowing and multilevel neural foraminal narrowing most pronounced right C5-6 where it appears moderate.  She does report increased stressors at home as well as uncontrolled anxiety and depression and increased irritability which has been more present since her stroke.  She is currently on sertraline and Klonopin per PCP.  She was previously followed by psychiatry but no follow-up recently.  Residual stroke deficits of aphasia and visual impairment which have been stable without worsening.  Reports great improvement of aphasia and typically only present with increased stressors and anxiety.  Denies new stroke/TIA symptoms.  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 She remains on Keppra 500 mg twice daily for seizure prophylaxis without any additional seizure activity since 12/2019.  Completed MRI brain and MR venogram on 02/02/2020. MRV unremarkable with patent superficial and deep cerebral venous sinuses without evidence of obstruction. MRI showed expected evolutionary changes of left temporal and parietal hemorrhage no evidence of enhancing lesions or acute abnormality.   Update 01/31/2020 JM:, Ms. Gentry FitzBingham returns for follow-up regarding parenchymal intercerebral hemorrhage and IVH 08/2019 s/p craniotomy and ventriculostomy of unclear etiology.  Residual deficits of decreased vision right eye and mild aphasia with great improvement of language.  Continues to work with outpatient therapy.  Denies residual cognitive difficulties.  MRI w/wo and MRV scheduled on 02/02/2020.  She does report having seizure activity approximately 3 weeks ago while she was visiting her friend in [place], [place].  Unable to view records from hospitalization.  She remembers speaking with her friend and then feeling as though "something was about to happen" and then she woke up in the hospital.  She was previously on Keppra for seizure prophylaxis s/p craniotomy but this was  discontinued by neurosurgery.  Apparently, no recommendations such as initiating AED and was discharged home.  She decided to restart Keppra 500 mg twice daily as she had leftover medication due to concern of recurrent seizure activity.  She has not had any reoccurring seizure activity and tolerating Keppra well.  Continues on aspirin 81 mg daily without bleeding or bruising.  Blood pressure today 102/68.  No further concerns at this time.  Initial consult visit 10/31/2019 Dr. Pearlean BrownieSethi: Ms. Gentry FitzBingham is a pleasant 40 year old African-American lady seen today for initial office follow-up visit following hospital admission for intracerebral hemorrhage in December 2020.  She is accompanied by sister.  History is obtained from them and review of electronic medical records and I personally reviewed imaging films in PACS.  She has no significant past medical history who [facility] after last known normal being on 11 PM on 08/24/2019 when she went to bed.  She woke up the next morning with a headache and neck pain.  She had delivered a child in August 2020 and was still breast-feeding.  She presented with right-sided weakness, and confusion.  CT scan of the head done in the ER showed large left temporal intracerebral hemorrhage with extension into the ventricles.  She had a Glasgow Coma Scale of 12 and was initially able to speak but had slurred speech and right-sided weakness.  She was intubated for airway protection and stat neurosurgical consultation was obtained.  Her blood pressure was tightly controlled.  She was taken for emergent craniotomy for evacuation of the clot.  She also had ventriculostomy placed to drain  her ventricles.  She had a prolonged stay in the ICU but did well and made gradual improvement.  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  This was subsequently confirmed on diagnostic  cerebral catheter angiogram on 08/31/2019 by Dr. Conchita Paris who noticed a relatively large cortical vein over the left frontoparietal convexity emptying into the midportion of the superior sagittal sinus with paucity of ramifying veins emptying into this large cortical vein suggesting likelihood of cortical venous thrombosis involving vein of Trolard.  The large cerebral venous sinuses are patent 2D echo showed normal ejection fraction.  Covid test was negative.  LDL was 124 mg percent.  Hemoglobin A1c was 4.7.  Urine drug screen was positive for marijuana.  Hypercoagulable panel labs were all negative.  Vasculitic labs were also negative patient obtained significant improvement initially she was felt to be candidate for inpatient rehab but she improved rapidly and was discharged home with requirement for 24/7 supervision.  She states she is continued to do well.  Physically she is able to ambulate independently without assistance but she is still has significant memory and cognitive difficulties.  Speech is improved but she still had word hesitancy and occasional paraphasias.  She cannot multitask and has poor short-term memory.  She gets frustrated and agitated easily.  She recently saw Dr. Mikal Plane who discontinued Keppra which was given only for seizure prophylaxis for surgery and she did not have a clear documented seizure.  She is able to take care of her young child and attend to his needs.  She is living with her mother and asked somebody around her 24 hours.  She was kept in the ICU and blood pressure tightly controlled and monitored.  She was extubated.  She made gradual improvement  ROS:   14 system review of systems is positive for those listed in HPI and all other systems negative  PMH:  Past Medical History:  Diagnosis Date   Seizures (HCC) 01/2020   Stroke [facility]) 2020    Social History:  Social History   Socioeconomic History   Marital status: Single    Spouse name: Not on file   Number  of children: Not on file   Years of education: Not on file   Highest education level: Not on file  Occupational History   Not on file  Tobacco Use   Smoking status: Never   Smokeless tobacco: Never  Substance and Sexual Activity   Alcohol use: Not on file   Drug use: Not on file   Sexual activity: Not on file  Other Topics Concern   Not on file  Social History Narrative   Not on file   Social Determinants of Health   Financial Resource Strain: Not on file  Food Insecurity: Not on file  Transportation Needs: Not on file  Physical Activity: Not on file  Stress: Not on file  Social Connections: Not on file  Intimate Partner Violence: Not on file    Medications:   Current Outpatient Medications on File Prior to Visit  Medication Sig Dispense Refill   baclofen (LIORESAL) 10 MG tablet Take 10 mg by mouth 3 (three) times daily.     clonazePAM (KLONOPIN) 0.5 MG tablet Take 0.5 mg by mouth 2 (two) times daily as needed for anxiety.     levETIRAcetam (KEPPRA XR) 500 MG 24 hr tablet Take 2 tablets (1,000 mg total) by mouth at  bedtime. 180 tablet 3   sertraline (ZOLOFT) 100 MG tablet Take 100 mg by mouth daily.     No current facility-administered medications on file prior to visit.    Allergies:   Allergies  Allergen Reactions   Penicillins Itching      Vitals Today's Vitals   04/29/21 1232  BP: 98/61  Pulse: (!) 57  Weight: 162 lb (73.5 kg)  Height:  (1.753 m)   Body mass index is 23.92 kg/m.  Physical Exam General: well developed, well nourished, pleasant middle-aged African-American lady, seated, in no evident distress Head: head normocephalic and atraumatic.  Neck: supple with no carotid or supraclavicular bruits Cardiovascular: regular rate and rhythm, no murmurs Musculoskeletal: no deformity.  Skin:  no rash/petichiae Vascular:  Normal pulses all extremities  Neurologic Exam Mental Status: Awake and fully alert.  Mild aphasia.  No evidence of  dysarthria oriented to place and time. Recent and remote memory intact.  Attention span, concentration and fund of knowledge mildly impaired.  Mood and affect appropriate.   Cranial Nerves: Pupils equal, briskly reactive to light. Extraocular movements full without nystagmus. Visual fields OD right periphery loss, visual field OS slight decreased visual acuity superior temporal quadrant otherwise intact. Hearing intact. Facial sensation intact. Face, tongue, palate moves normally and symmetrically.  Motor: Normal bulk and tone. Normal strength in all tested extremity muscles. Sensory.: intact to touch ,pinprick .position and vibratory sensation.  Coordination: Rapid alternating movements normal in all extremities. Finger-to-nose and heel-to-shin performed accurately bilaterally. Gait and Station: Arises from chair without difficulty. Stance is normal. Gait demonstrates normal stride length and balance . Able to heel, toe and tandem walk without difficulty.  Reflexes: 1+ and symmetric. Toes downgoing.      ASSESSMENT/PLAN:  40 year old African-American lady with left and pleural parenchymal intracerebral hemorrhage as well as intraventricular hemorrhage in December 2020 requiring craniotomy as well as ventriculostomy of  unclear etiology.  Possibly vein of Trolard thrombosis but it occurred nearly 3 months postpartum making association with pregnancy doubtful.  Lab work for vasculitis and hypercoagulable panel were negative.  Seizure activity 12/2019 after discontinuing Keppra as advised by neurosurgery (as initially prescribed as prophylaxis) without any reoccurring seizure activity after restarting Keppra. Hx of remote migraine headaches - restarted around 06/2020   Migraine Cervicalgia -Information for [facility] for dry needling provided as suspicion that continued neck pain likely increasing migraine occurrence -start Flexeril 5 mg nightly and continue baclofen  AM and afternoon -continue  Ubrelvy 100 mg as needed for acute migraine relief.  May repeat x1 after 2 hrs if needed. Max dose /24hrs.  Unable to prescribe triptans for emergent relief due to stroke history  Hx of ICH -Residual aphasia, cognitive impairment and visual impairment (OD right periphery)  -recently approved for Social Security disability -MRV unremarkable for underlying abnormalities -MRI stable -Discussed secondary stroke prevention measures and importance of close PCP follow-up for aggressive stroke risk factor management  Seizure, late effect of stroke -Prior seizure activity 12/2019 evaluated at [facility] consisting of shaking and unresponsive lasting 2 to 3 minutes resolved spontaneously without intervention with postictal confusion -EEG 03/14/2020 normal -Continue Keppra XR  nightly - refill up to date    Follow-up in 6 months or call earlier if needed   CC:  [facility] provider: Dr. Para March, Chapman Moss., MD    I spent 39 minutes of face-to-face and non-face-to-face time with patient.  This included previsit chart review, lab review, study review, order entry, electronic health  record documentation, patient education regarding continued migraines and neck pain and further treatment options, history of prior stroke, residual deficits, seizure activity and answered all other questions to patient satisfaction  Ihor Austin, AGNP-BC  [facility] [address] [place], [place] [address]  Phone [phone] Fax [phone] Note: This document was prepared with digital dictation and possible smart phrase technology. Any transcriptional errors that result from this process are unintentional.

## 2021-04-29 NOTE — Patient Instructions (Addendum)
Add Flexeril at night for continued neck pain likely contributing to your headaches.  You can continue the baclofen during the day (do not take night time baclofen dose).  Would recommend contacting [facility] (papers provided)  Continue gradually as needed for migraine relief  Continue Keppra XR 1000mg  daily for seizure prevention  Continue aspirin 81 mg daily for secondary stroke prevention      Followup in the future with me in 6 months or call earlier if needed       Thank you for coming to see at [facility]. I hope we have been able to provide you high quality care today.  You may receive a patient satisfaction survey over the next few weeks. We would appreciate your feedback and comments so that we may continue to improve ourselves and the health of our patients.

## 2021-05-05 NOTE — Progress Notes (Signed)
I agree with the above plan 

## 2021-05-14 IMAGING — CT CT HEAD W/O CM
4 series · 16 of 47 positions shown, 18 images · non-contrast
Comparison: Prior CT from 08/30/2019.

CLINICAL DATA: Follow-up examination for intracerebral hemorrhage.

EXAM:
CT HEAD WITHOUT CONTRAST
TECHNIQUE: Contiguous axial images were obtained from the base of the skull
through the vertex without intravenous contrast.

[Series 3: head without · axial · non-contrast · 0.44mm/px · z∈[-92,+28]mm · 6 of 34 slices shown, 8 images]
[im 5/34  brain]
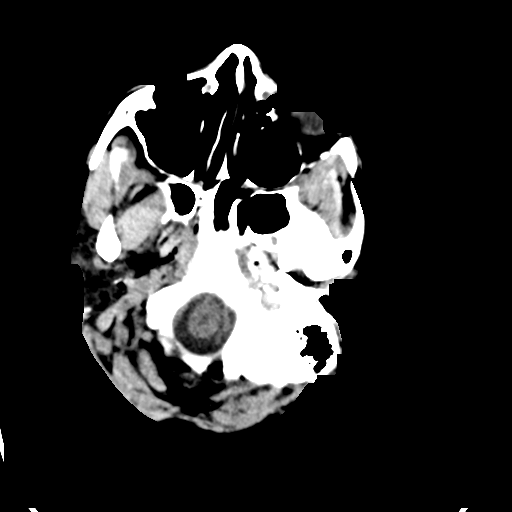
[im 5/34  bone]
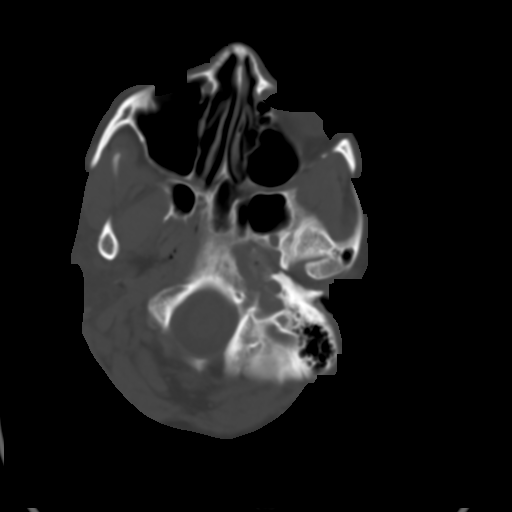
[im 10/34  brain]
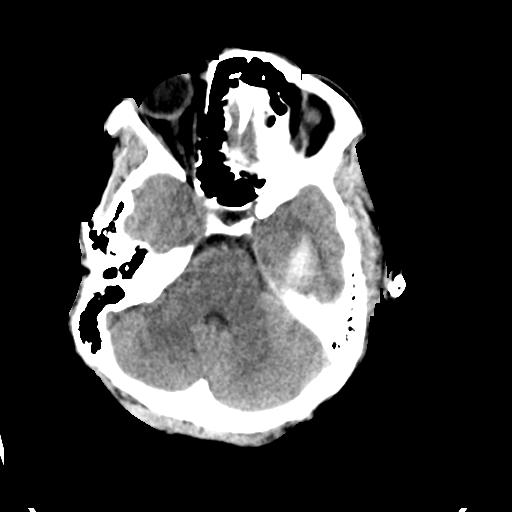
[im 15/34  brain]
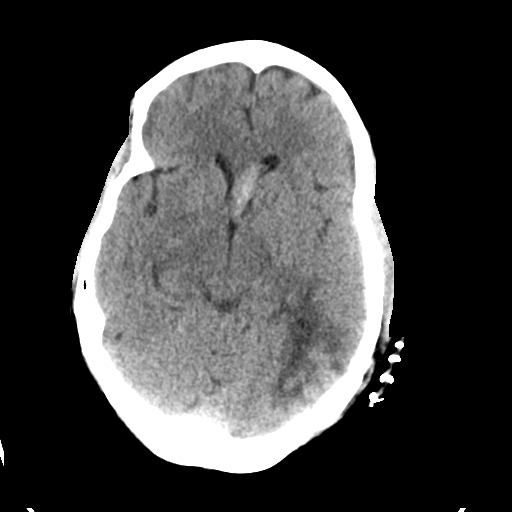
[im 19/34  brain]
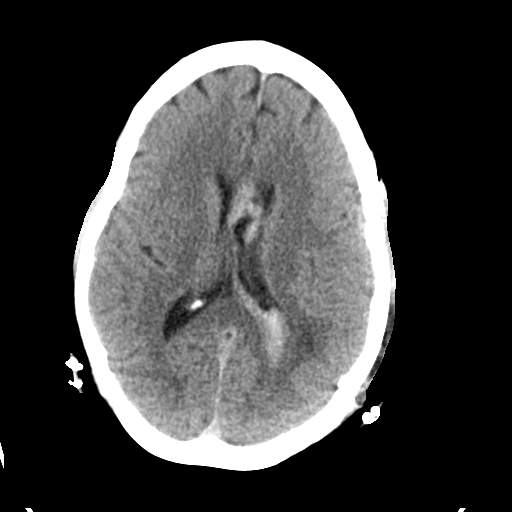
[im 24/34  brain]
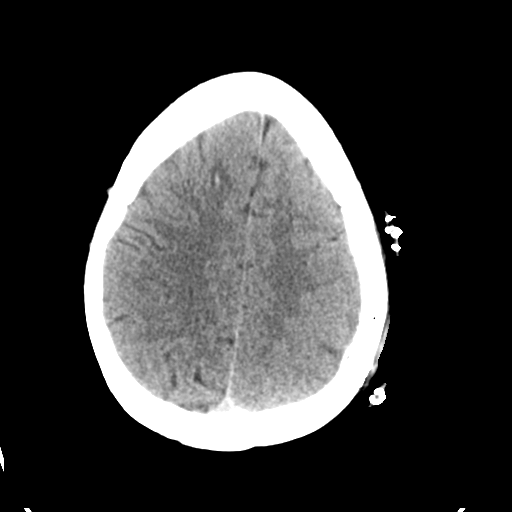
[im 24/34  bone]
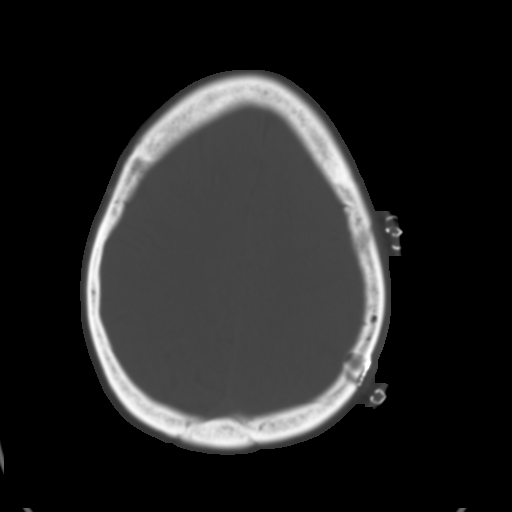
[im 29/34  brain]
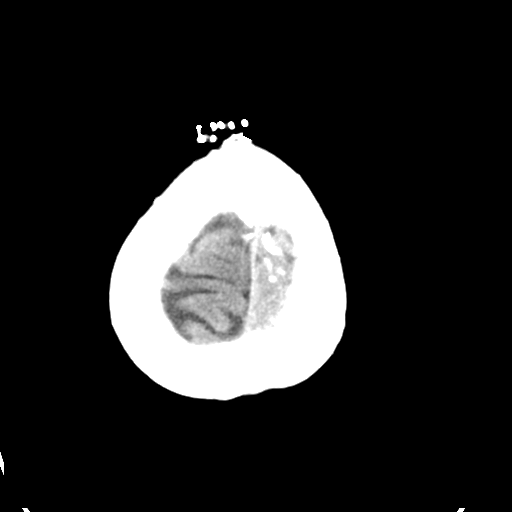

[Series 4: head bone · axial · 0.44mm/px · z∈[-96,-40]mm · 4 of 87 slices shown]
[im 9/87  bone]
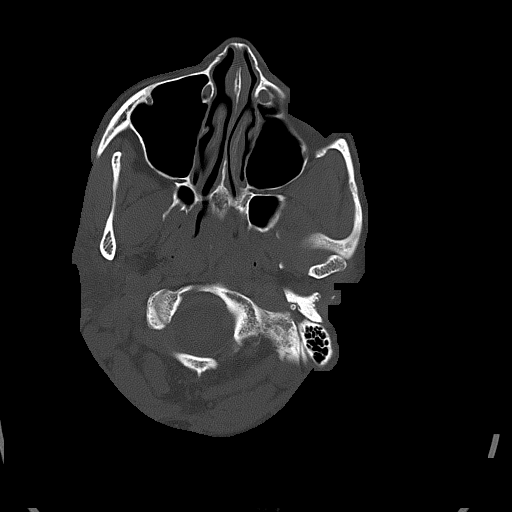
[im 17/87  bone]
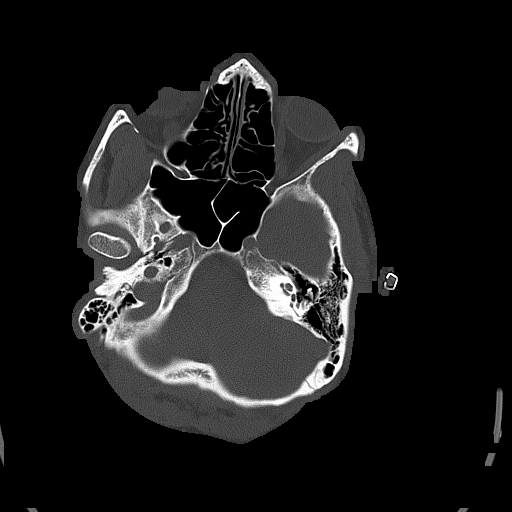
[im 29/87  bone]
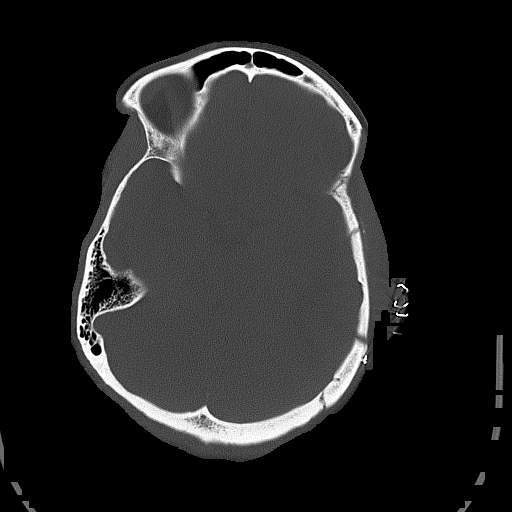
[im 37/87  bone]
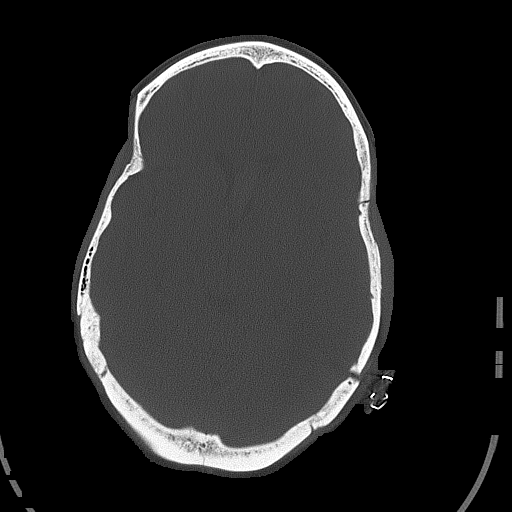

[Series 5: head without cor · coronal · non-contrast · 0.34mm/px · 3 of 73 slices shown]
[im 25/73  brain]
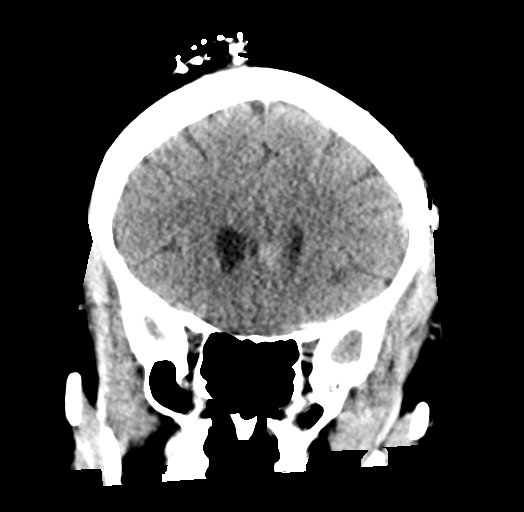
[im 33/73  brain]
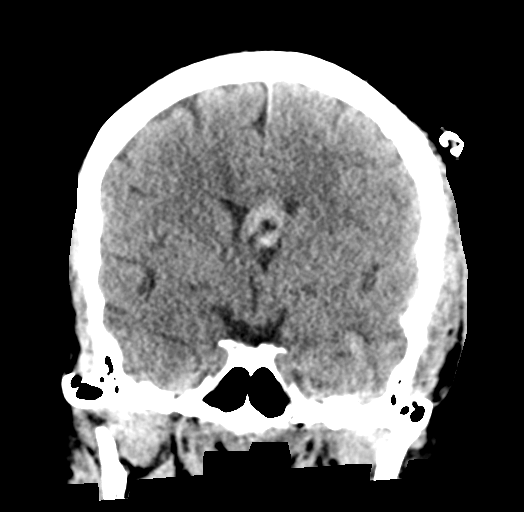
[im 41/73  brain]
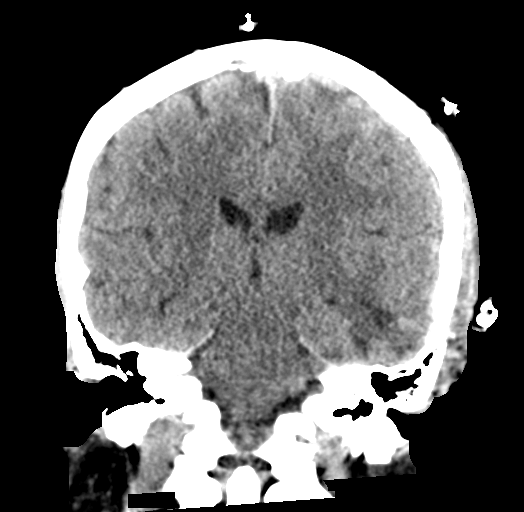

[Series 6: head without sag · sagittal · non-contrast · 0.34mm/px · 3 of 56 slices shown]
[im 19/56  brain]
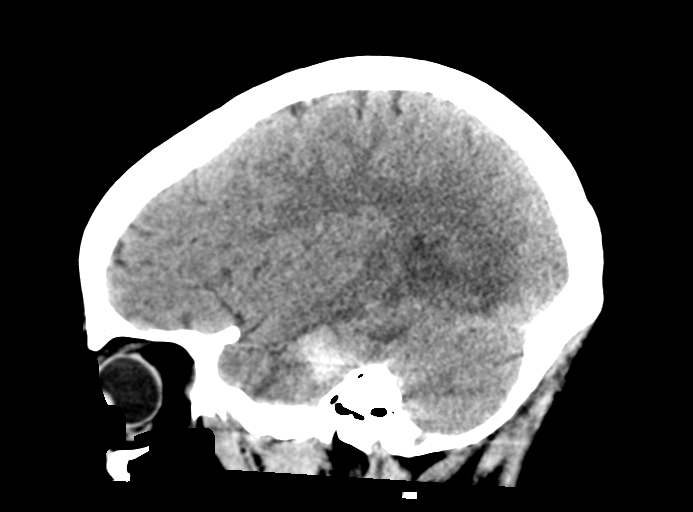
[im 28/56  brain]
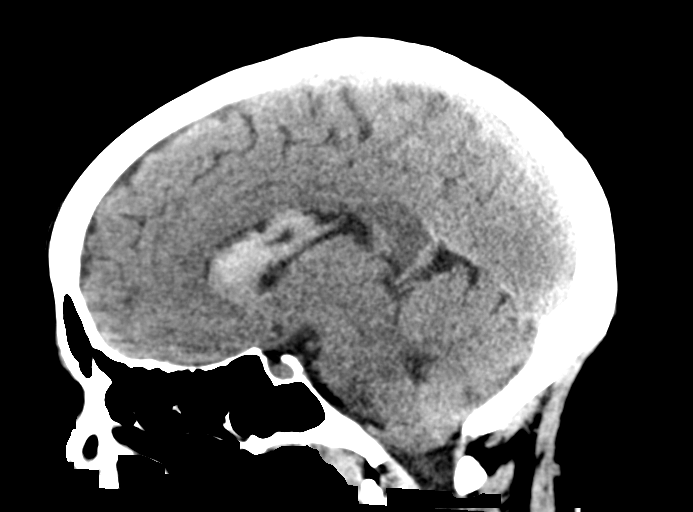
[im 37/56  brain]
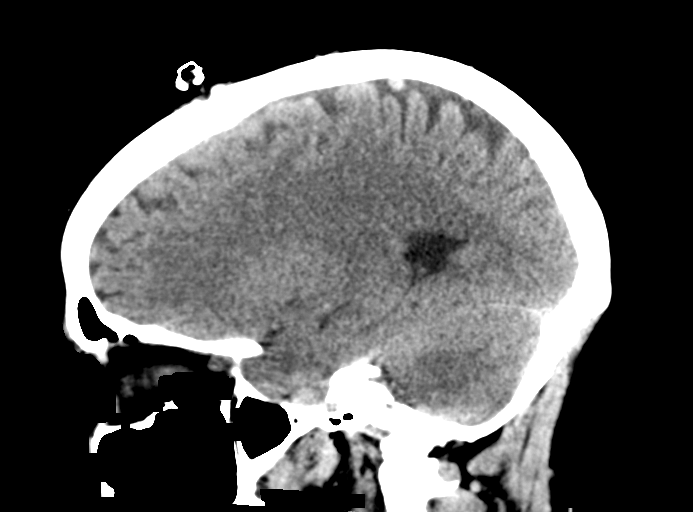

[16 of 47 positions shown; findings below may reference images not displayed]

FINDINGS: Brain: Continued interval evolution of subacute intraparenchymal
hemorrhage centered at the left temporal lobe, decreased in size now
measuring up to 2.5 cm, previously 3.2 cm when measured at similar
level. Mildly decreased edema within the adjacent left temporal
occipital region. Intraventricular extension with persistent
hemorrhage within the left lateral ventricle, decreased from
previous. A right frontal approach ventriculostomy has been removed.
Small amount of residual blood seen along the previous catheter
tract. Ventricular size is stable without hydrocephalus or trapping.
Basilar cisterns remain patent.

Sequelae of prior left craniotomy. Previously seen pneumocephalus
has resolved. Probable trace residual extra-axial blood products
seen subjacent to the bone flap without mass effect. Minimal
residual hyperdensity at the level of the previously seen thrombosed
vein of Trolard.

No new intracranial hemorrhage. No new infarct or other complicating
features.

Vascular: No hyperdense vessel.

Skull: Left-sided craniotomy with right frontal burr hole. Skin
staples remain in place.

Sinuses/Orbits: Globes and orbital soft tissues within normal
limits. Paranasal sinuses and mastoid air cells remain clear.

Other: None.
IMPRESSION: 1. Continued interval evolution of subacute left temporal
hemorrhage, decreased in size with decreased mass effect from
previous.
2. Persistent but decreased intraventricular hemorrhage, with
interval removal of right frontal ventriculostomy. No hydrocephalus
or ventricular trapping.
3. Faint residual hyperdensity at previously seen thrombosed vein of
Trolard, improved from previous.
4. No other new acute intracranial abnormality.

## 2021-06-01 ENCOUNTER — Emergency Department (HOSPITAL_COMMUNITY)
Admission: EM | Admit: 2021-06-01 | Discharge: 2021-06-02 | Disposition: A | Discharge status: Home / Self Care | Payer: Medicaid Other | Attending: Emergency Medicine | Admitting: Emergency Medicine

## 2021-06-01 ENCOUNTER — Encounter (HOSPITAL_COMMUNITY): Payer: Self-pay | Admitting: Emergency Medicine

## 2021-06-01 ENCOUNTER — Other Ambulatory Visit: Payer: Self-pay

## 2021-06-01 DIAGNOSIS — R569 Unspecified convulsions: Secondary | ICD-10-CM | POA: Insufficient documentation

## 2021-06-01 DIAGNOSIS — Z5321 Procedure and treatment not carried out due to patient leaving prior to being seen by health care provider: Secondary | ICD-10-CM | POA: Insufficient documentation

## 2021-06-01 LAB — BASIC METABOLIC PANEL
Anion gap: 10 (ref 5–15)
BUN: <5 mg/dL — ABNORMAL LOW (ref 6–20)
CO2: 23 mmol/L (ref 22–32)
Calcium: 9.7 mg/dL (ref 8.9–10.3)
Chloride: 105 mmol/L (ref 98–111)
Creatinine, Ser: 0.78 mg/dL (ref 0.44–1.00)
GFR, Estimated: >60 mL/min (ref >60)
Glucose, Bld: 120 mg/dL — ABNORMAL HIGH (ref 70–99)
Potassium: 4.6 mmol/L (ref 3.5–5.1)
Sodium: 138 mmol/L (ref 135–145)

## 2021-06-01 LAB — I-STAT BETA HCG BLOOD, ED (MC, WL, AP ONLY): I-stat hCG, quantitative: <5 m[IU]/mL (ref <5)

## 2021-06-01 LAB — CBC
HCT: 38.5 % (ref 36.0–46.0)
Hemoglobin: 11.9 g/dL — ABNORMAL LOW (ref 12.0–15.0)
MCH: 28.8 pg (ref 26.0–34.0)
MCHC: 30.9 g/dL (ref 30.0–36.0)
MCV: 93.2 fL (ref 80.0–100.0)
Platelets: 296 10*3/uL (ref 150–400)
RBC: 4.13 MIL/uL (ref 3.87–5.11)
RDW: 13.4 % (ref 11.5–15.5)
WBC: 8.6 10*3/uL (ref 4.0–10.5)
nRBC: 0 % (ref 0.0–0.2)

## 2021-06-01 NOTE — ED Provider Notes (Signed)
Emergency Medicine Provider Triage Evaluation Note  Amanda Small , a 40 y.o. female  was evaluated in triage.  Pt complains of seizure.  Patient was brought in by EMS, reports she was cooking when she suddenly began to feel hot, fell down to the ground and had a seizure.  She is reporting pain to her tongue, headache along with photophobia.  Last seizure approximately a year ago.  Patient does report she keeps up with her medications for seizure control, was previously on Keppra however this was discontinued and she now takes Ubrogepart.  She also reports taking another series of medications.  Feels overall lethargic.  Did have COVID-19 infection 2 weeks ago.  Review of Systems  Positive: Headache, photophobia, Negative: Fever, nausea, vomiting  Physical Exam  There were no vitals taken for this visit. Gen:   Awake, no distress   Resp:  Normal effort MSK:   Moves extremities without difficulty  Other:    Medical Decision Making  Medically screening exam initiated at 7:23 PM.  Appropriate orders placed.  HEATHERLY STENNER was informed that the remainder of the evaluation will be completed by another provider, this initial triage assessment does not replace that evaluation, and the importance of remaining in the ED until their evaluation is complete.  Patient brought in via EMS status post seizure.  Endorsing a headache, did bit her tongue, does endorse some photophobia.  According to patient she no longer takes Keppra, she had a new medication switch which appears on her record to be medication use for migraines.  She is unsure who her neurologist is at this time. Appears to be post ictal at this time.    Claude Manges, PA-C 06/01/21 1929    Maia Plan, MD 06/08/21 [phone]
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
## 2021-06-02 ENCOUNTER — Telehealth: Payer: Self-pay | Admitting: Adult Health

## 2021-06-02 NOTE — Telephone Encounter (Signed)
If seizure in setting of missing Keppra dosage, no indication for follow-up visit. It is imperative that she continue to take Keppra as advised without missing dosages as this greatly increases risk of seizure. Thank you

## 2021-06-02 NOTE — Telephone Encounter (Signed)
Called patient and advised her the ED notes stated keppra was discontinued. However I advised her it was not discontinued. She then stated she knows that,  but she missed two doses. I advised her that is most likely what caused her to have a seizure. She asked if she has to take it all the time; I advised yes she does, regularly every night.  Patient verbalized understanding, appreciation.

## 2021-06-02 NOTE — Telephone Encounter (Signed)
Pt had a seizure on yesterday and she is wanting to know if she needs to come in because at the ED they did not give her MRI. Pt requesting a call back.

## 2021-06-16 ENCOUNTER — Telehealth: Payer: Self-pay | Admitting: Adult Health

## 2021-06-16 NOTE — Telephone Encounter (Signed)
fyi
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
## 2021-06-17 DIAGNOSIS — Z0289 Encounter for other administrative examinations: Secondary | ICD-10-CM

## 2021-06-17 NOTE — Telephone Encounter (Signed)
Form received, placed on jessica desk for review and signature

## 2021-06-18 NOTE — Telephone Encounter (Signed)
Contacted pt back, she stated She already got short term disability from social security. This form is for her long term disability

## 2021-06-18 NOTE — Telephone Encounter (Signed)
FYI  Contacted pt back, she began to get upset and yelling due to me trying to get clarity on who previously filled these forms out for her. She was unable to say who. I offered pt a telephone visit to discuss this matter with Shanda Bumps directly, she accept Monday 06/23/21 at 1130am

## 2021-06-18 NOTE — Telephone Encounter (Signed)
When I last saw her, she was recently approved for Social Security disability. We have never filled out any disability forms for her in the past - I'm not sure what she is requesting these forms for?

## 2021-06-23 ENCOUNTER — Other Ambulatory Visit: Payer: Self-pay

## 2021-06-23 ENCOUNTER — Ambulatory Visit (INDEPENDENT_AMBULATORY_CARE_PROVIDER_SITE_OTHER): Payer: Medicaid Other | Admitting: Adult Health

## 2021-06-23 ENCOUNTER — Encounter: Payer: Self-pay | Admitting: Adult Health

## 2021-06-23 DIAGNOSIS — R569 Unspecified convulsions: Secondary | ICD-10-CM | POA: Diagnosis not present

## 2021-06-23 DIAGNOSIS — I69398 Other sequelae of cerebral infarction: Secondary | ICD-10-CM

## 2021-06-23 DIAGNOSIS — I611 Nontraumatic intracerebral hemorrhage in hemisphere, cortical: Secondary | ICD-10-CM | POA: Diagnosis not present

## 2021-06-23 NOTE — Progress Notes (Signed)
Virtual Visit via Telephone Note  I connected with Amanda Small on 06/23/21 at 11:30 AM EDT by telephone and verified that I am speaking with the correct person using two identifiers.  Location: Patient: home Provider: in office   I discussed the limitations, risks, security and privacy concerns of performing an evaluation and management service by telephone and the availability of in person appointments. I also discussed with the patient that there may be a patient responsible charge related to this service. The patient expressed understanding and agreed to proceed.   History of Present Illness:  Amanda Small is a 40 y.o. female with PMHx of ICH with IVH s/p craniotomy and ventriculostomy of unclear source on 08/2019 with residual aphasia, cognitive impairment and visual impairment as well as late effect seizures.  Virtual visit via telephone requested today to discuss requests on disability paperwork.  She was previously seen in office 04/29/2021 reporting she was recently approved for Social Security disability. This office has never assisted with short or long term disability previously and has been maintained by her PCP. Per review of epic, she did discuss disability paperwork with her PCP at recent visit 06/10/2021 but no further remarks if they planned on assisting. Per pt, PCP told her to have our office complete paperwork.   Of note, she did have a seizure occurrence on 06/01/2021 evaluated at [facility] likely in setting of medication noncompliance - she apparently left prior to full evaluation.  She has has been compliant on Keppra regimen and no additional seizure events.      Observations/Objective:  Very pleasant female speaking fluently during majority of conversation but did have occasional hesitancy   Assessment and Plan:  Amanda Small is a 40 y.o. with:   ICH with IVH 08/2019 with residual deficits Seizures, late effect of stroke   She is requesting long-term  disability to be completed.  She has been on long-term disability over the past year assisted by her PCP. Will reach out to her PCP regarding continuation of paperwork as they have already been completing. Will assist pt with completing if necessary or if PCP refuses as she would not be able to return back to work due to residual stroke deficits but for consistency reasons and to avoid confusion in the future, request PCP continue to assist on going.  Discussed importance of Keppra compliance for seizure prevention as she is at increased risk of seizures due to history of ICH with IVH with medication noncompliance.  She verbalized understanding and has not missed any additional dosages since that time.     Follow Up Instructions:  Follow-up as scheduled on 09/25/2021 or call earlier if needed    I discussed the assessment and treatment plan with the patient. The patient was provided an opportunity to ask questions and all were answered. The patient agreed with the plan and demonstrated an understanding of the instructions.   The patient was advised to call back or seek an in-person evaluation if the symptoms worsen or if the condition fails to improve as anticipated.  I provided 12 minutes of non-face-to-face time during this encounter.   Ihor Austin, AGNP-BC  [facility] [address] [place], [place] [address]  Phone [phone] Fax [phone] Note: This document was prepared with digital dictation and possible smart phrase technology. Any transcriptional errors that result from this process are unintentional.

## 2021-07-02 NOTE — Telephone Encounter (Signed)
I have attempted to contact PCP in regards to disability paperwork that he previously completed.  I have not yet heard back from PCP.  At this time, it will be requested that he continue to complete disability paperwork as he completed previously. Thank you

## 2021-07-02 NOTE — Telephone Encounter (Signed)
FYI

## 2021-07-03 NOTE — Telephone Encounter (Signed)
Form given back to debra settle, medical records

## 2021-09-25 ENCOUNTER — Encounter: Payer: Self-pay | Admitting: Adult Health

## 2021-09-25 ENCOUNTER — Other Ambulatory Visit: Payer: Self-pay

## 2021-09-25 ENCOUNTER — Ambulatory Visit: Payer: Medicaid Other | Admitting: Adult Health

## 2021-09-25 VITALS — BP 95/57 | HR 63 | Ht 69.0 in | Wt 171.0 lb

## 2021-09-25 DIAGNOSIS — R569 Unspecified convulsions: Secondary | ICD-10-CM | POA: Diagnosis not present

## 2021-09-25 DIAGNOSIS — G43009 Migraine without aura, not intractable, without status migrainosus: Secondary | ICD-10-CM

## 2021-09-25 DIAGNOSIS — I69398 Other sequelae of cerebral infarction: Secondary | ICD-10-CM

## 2021-09-25 DIAGNOSIS — I611 Nontraumatic intracerebral hemorrhage in hemisphere, cortical: Secondary | ICD-10-CM | POA: Diagnosis not present

## 2021-09-25 MED ORDER — UBRELVY 100 MG PO TABS
100.0000 mg | ORAL_TABLET | ORAL | 11 refills | Status: DC | PRN
Start: 2021-09-25 — End: 2022-05-26

## 2021-09-25 MED ORDER — LEVETIRACETAM ER 500 MG PO TB24: 1000.0000 mg | ORAL_TABLET | Freq: Every day | ORAL | 3 refills | Status: DC

## 2021-09-25 NOTE — Progress Notes (Deleted)
[facility] [address] [place] [address] [phone]       OFFICE FOLLOW-UP NOTE  Amanda Small Small Date of Birth:  1980/10/17 Medical Record Number:  [id number]    Chief complaint: Chief Complaint  Patient presents with   Follow-up    RM 3 with sister Amanda Small Small   Pt is well and stable, no new concerns      HISTORY:  Amanda Small Small is a 41 y.o. female with PMHx of ICH with IVH s/p craniotomy and ventriculostomy of unclear source on 08/2019 with residual aphasia, cognitive impairment and visual impairment as well as late effect seizures.  Continues to follow in office for such.    Today, 09/25/2021, patient returns for follow-up previously seen via [place] video visit in 06/2021.   Migraines:   Cervicalgia: -Reports 2 migraines per week usually associated with neck tension - no new changes since prior visit -Evaluated by [facility] neurosurgery 6/27 -no further recommendations as no surgical option for her condition -recommended continued yoga, deep tissue massage and stretching -Continues baclofen 10 mg 3 times daily with only limited benefit -Continue Ubrelvy for emergent relief with benefit  History:  Hx of migraine headaches without recurrence over the past 6 years but restarted 06/2020 with similar symptoms Unilateral temporal pain associated with photophobia, phonophobia and nausea with pulsating quality Prior frequent use of Tylenol, Excedrin and BC powder -denies continued frequent use Typically associated with increased stress and present mid-to-late afternoon Concern for cervicalgia contributing to recurrence of migraines therefore refer to PT for dry needling -denies benefit only participated in 3 sessions.  She continues to experience left-sided neck pain which limits/interferes with daily functioning She reports experiencing migraines at least 1/we week ek -provided sample of Ubrelvy at prior visit which was beneficial.  She is  unable to state duration of migraine that she will take a nap and upon awakening, migraine resolved   L IPH and IVH, 08/2019 -Residual aphasia, cognitive impairment, mild imbalance and right peripheral field vision impairment  -Receiving Social Security disability, on long term disability provided by PCP -Denies new or worsening stroke/TIA symptoms -Blood pressure today 98/61  Seizure, late effect of stroke -Initial onset 12/2019 with most recent occurrence 05/2021 in setting of medication noncompliance -Denies any additional seizure activity -Remains on Keppra XR 1000 mg nightly -denies side effects. Reports compliance          ROS:   14 system review of systems is positive for those listed in HPI and all other systems negative  PMH:  Past Medical History:  Diagnosis Date   Seizures ([place]) 01/2020   Stroke [facility]) 2020    Social History:  Social History   Socioeconomic History   Marital status: Single    Spouse name: Not on file   Number of children: Not on file   Years of education: Not on file   Highest education level: Not on file  Occupational History   Not on file  Tobacco Use   Smoking status: Never   Smokeless tobacco: Never  Substance and Sexual Activity   Alcohol use: Not on file   Drug use: Not on file   Sexual activity: Not on file  Other Topics Concern   Not on file  Social History Narrative   Not on file   Social Determinants of Health   Financial Resource Strain: Not on file  Food Insecurity: Not on file  Transportation Needs: Not on file  Physical Activity: Not on file  Stress: Not on file  Social Connections: Not on file  Intimate Partner Violence: Not on file    Medications:   Current Outpatient Medications on File Prior to Visit  Medication Sig Dispense Refill   baclofen (LIORESAL) 10 MG tablet Take 10 mg by mouth 3 (three) times daily.     clonazePAM (KLONOPIN) 0.5 MG tablet Take 0.5 mg by mouth 2 (two) times daily as needed for  anxiety.     cyclobenzaprine (FLEXERIL) 5 MG tablet Take 1 tablet (5 mg total) by mouth at bedtime as needed for muscle spasms. 30 tablet 1   levETIRAcetam (KEPPRA XR) 500 MG 24 hr tablet Take 2 tablets (1,000 mg total) by mouth at bedtime. 180 tablet 3   sertraline (ZOLOFT) 100 MG tablet Take 100 mg by mouth daily.     Ubrogepant (UBRELVY) 100 MG TABS Take 100 mg by mouth as needed (Acute migraine headache. May repeat x1 after 2 hours. Max dose 200mg Amanda Small Small). 10 tablet 5   No current facility-administered medications on file prior to visit.    Allergies:   Allergies  Allergen Reactions   Penicillins Itching   Penicillin G     Other reaction(s): Unknown      Vitals Today's Vitals   09/25/21 0941  BP: (!) 95/57  Pulse: 63  Weight: 171 lb (77.6 kg)  Height: 5\' 9"  (1.753 m)    Body mass index is 25.25 kg/m.  Physical Exam General: well developed, well nourished, pleasant middle-aged African-American lady, seated, in no evident distress Head: head normocephalic and atraumatic.  Neck: supple with no carotid or supraclavicular bruits Cardiovascular: regular rate and rhythm, no murmurs Musculoskeletal: no deformity.  Skin:  no rash/petichiae Vascular:  Normal pulses all extremities  Neurologic Exam Mental Status: Awake and fully alert.  Mild aphasia.  No evidence of dysarthria oriented to place and time. Recent and remote memory intact.  Attention span, concentration and fund of knowledge mildly impaired.  Mood and affect appropriate.   Cranial Nerves: Pupils equal, briskly reactive to light. Extraocular movements full without nystagmus. Visual fields OD right periphery loss, visual field OS slight decreased visual acuity superior temporal quadrant otherwise intact. Hearing intact. Facial sensation intact. Face, tongue, palate moves normally and symmetrically.  Motor: Normal bulk and tone. Normal strength in all tested extremity muscles. Sensory.: intact to touch ,pinprick  .position and vibratory sensation.  Coordination: Rapid alternating movements normal in all extremities. Finger-to-nose and heel-to-shin performed accurately bilaterally. Gait and Station: Arises from chair without difficulty. Stance is normal. Gait demonstrates normal stride length and balance . Able to heel, toe and tandem walk without difficulty.  Reflexes: 1+ and symmetric. Toes downgoing.      ASSESSMENT/PLAN:  41 year old African-American lady with left and pleural parenchymal intracerebral hemorrhage as well as intraventricular hemorrhage in 08/2019 s/p craniotomy and ventriculostomy of  unclear etiology.  Possibly vein of Trolard thrombosis but it occurred nearly 3 months postpartum making association with pregnancy doubtful.  Lab work for vasculitis and hypercoagulable panel were negative.  Seizure activity 12/2019 after discontinuing Keppra as advised by neurosurgery (as initially prescribed as prophylaxis) and additional sz 05/2021 in setting of medication noncompliance. Hx of remote migraine headaches - restarted around 06/2020   Migraine Cervicalgia -Information for [facility] for dry needling provided as suspicion that continued neck pain likely increasing migraine occurrence -start Flexeril 5 mg nightly and continue baclofen 10mg  AM and afternoon -continue Ubrelvy 100 mg as needed for acute migraine relief.  May repeat x1 after 2  hrs if needed. Max dose 200mg /24hrs.  Unable to prescribe triptans for emergent relief due to stroke history  Hx of ICH -Residual aphasia, cognitive impairment and visual impairment (OD right periphery)  -Continue to follow with PCP for long-term disability -MRV 01/2020 unremarkable for underlying abnormalities -MRI brain w/wo 01/2020 stable -Discussed secondary stroke prevention measures and importance of close PCP follow-up for aggressive stroke risk factor management  Seizure, late effect of stroke -seizure activity 12/2019 evaluated at [facility]  consisting of shaking and unresponsive lasting 2 to 3 minutes resolved spontaneously without intervention with postictal confusion -reoccurrence 05/2021 in setting of medication noncompliance -EEG 03/14/2020 normal -Continue Keppra XR 1000mg  nightly - refill up to date -discussed avoidance of seizure provoking triggers such as medication noncompliance, use of stimulants, altered sleep habits/routine and drastic dietary changes    Follow-up in 6 months or call earlier if needed   CC:  [facility] provider: Dr. Tilden Fossa, Genoveva Ill., MD    I spent 39 minutes of face-to-face and non-face-to-face time with patient.  This included previsit chart review, lab review, study review, order entry, electronic health record documentation, patient education regarding continued migraines and neck pain and further treatment options, history of prior stroke, residual deficits, seizure activity and answered all other questions to patient satisfaction  Frann Rider, AGNP-BC  [facility] [address] [place], [place] [address]  Phone [phone] Fax [phone] Note: This document was prepared with digital dictation and possible smart phrase technology. Any transcriptional errors that result from this process are unintentional.

## 2021-09-25 NOTE — Progress Notes (Signed)
[facility] [address] [place]. [place] [address] [phone]       OFFICE FOLLOW-UP NOTE  [name] Date of Birth:  [birth year] Medical Record Number:  [id number]    Chief complaint: Chief Complaint  Patient presents with   Follow-up    RM 3 with sister Amanda Small   Pt is well and stable, no new concerns       HISTORY:  Amanda Small is a 41 y.o. female with PMHx of ICH with IVH s/p craniotomy and ventriculostomy of unclear source on 08/2019 with residual aphasia, cognitive impairment and visual impairment as well as late effect seizures.  Continues to follow in office for such.   Today, 09/25/2021, patient returns for follow-up accompanied by her sister and son previously seen via [place] video visit in 06/2021. Overall doing well - no new concerns   Migraines:   Cervicalgia: -reports 4 migraine days per month (prior 8/month) Roselyn Meier for emergent relief with benefit -can worsen with stressors, loud noises, over stimulation, lack of sleep  History:  Hx of migraine headaches without recurrence over the past 6 years but restarted 06/2020 with similar symptoms Unilateral temporal pain associated with photophobia, phonophobia and nausea with pulsating quality Prior frequent use of Tylenol, Excedrin and BC powder -denies continued frequent use Typically associated with increased stress and present mid-to-late afternoon Concern for cervicalgia as contributing factor   L IPH and IVH, 08/2019 -Residual aphasia, cognitive impairment, mild imbalance and right peripheral field vision impairment stable -On disability but looking for different jobs - plans to start vocational rehab soon -Denies new or worsening stroke/TIA symptoms -Blood pressure today 95/57   Seizure, late effect of stroke -Initial onset 12/2019 with most recent occurrence 05/2021 in setting of medication noncompliance -Denies any additional seizure activity -Remains on Keppra  XR 1000 mg nightly -denies side effects. Reports compliance       ROS:   14 system review of systems is positive for those listed in HPI and all other systems negative  PMH:  Past Medical History:  Diagnosis Date   Seizures ([place]) 01/2020   Stroke [facility]) 2020    Social History:  Social History   Socioeconomic History   Marital status: Single    Spouse name: Not on file   Number of children: Not on file   Years of education: Not on file   Highest education level: Not on file  Occupational History   Not on file  Tobacco Use   Smoking status: Never   Smokeless tobacco: Never  Substance and Sexual Activity   Alcohol use: Not on file   Drug use: Not on file   Sexual activity: Not on file  Other Topics Concern   Not on file  Social History Narrative   Not on file   Social Determinants of Health   Financial Resource Strain: Not on file  Food Insecurity: Not on file  Transportation Needs: Not on file  Physical Activity: Not on file  Stress: Not on file  Social Connections: Not on file  Intimate Partner Violence: Not on file    Medications:   Current Outpatient Medications on File Prior to Visit  Medication Sig Dispense Refill   baclofen (LIORESAL) 10 MG tablet Take 10 mg by mouth 3 (three) times daily.     clonazePAM (KLONOPIN) 0.5 MG tablet Take 0.5 mg by mouth 2 (two) times daily as needed for anxiety.     cyclobenzaprine (FLEXERIL) 5 MG tablet Take 1 tablet (  5 mg total) by mouth at bedtime as needed for muscle spasms. 30 tablet 1   levETIRAcetam (KEPPRA XR) 500 MG 24 hr tablet Take 2 tablets (1,000 mg total) by mouth at bedtime. 180 tablet 3   sertraline (ZOLOFT) 100 MG tablet Take 100 mg by mouth daily.     Ubrogepant (UBRELVY) 100 MG TABS Take 100 mg by mouth as needed (Acute migraine headache. May repeat x1 after 2 hours. Max dose 200mg Margo Aye). 10 tablet 5   No current facility-administered medications on file prior to visit.    Allergies:   Allergies   Allergen Reactions   Penicillins Itching   Penicillin G     Other reaction(s): Unknown      Vitals Today's Vitals   09/25/21 0941  BP: (!) 95/57  Pulse: 63  Weight: 171 lb (77.6 kg)  Height: 5\' 9"  (1.753 m)    Body mass index is 25.25 kg/m.  Physical Exam General: well developed, well nourished, very pleasant middle-aged African-American lady, seated, in no evident distress Head: head normocephalic and atraumatic.  Neck: supple with no carotid or supraclavicular bruits Cardiovascular: regular rate and rhythm, no murmurs Musculoskeletal: no deformity.  Skin:  no rash/petichiae Vascular:  Normal pulses all extremities  Neurologic Exam Mental Status: Awake and fully alert.  Mild aphasia.  No evidence of dysarthria oriented to place and time. Recent and remote memory intact.  Attention span, concentration and fund of knowledge mildly impaired.  Mood and affect appropriate.   Cranial Nerves: Pupils equal, briskly reactive to light. Extraocular movements full without nystagmus. Visual fields OD right periphery loss, visual field OS slight decreased visual acuity superior temporal quadrant otherwise intact. Hearing intact. Facial sensation intact. Face, tongue, palate moves normally and symmetrically.  Motor: Normal bulk and tone. Normal strength in all tested extremity muscles. Sensory.: intact to touch ,pinprick .position and vibratory sensation.  Coordination: Rapid alternating movements normal in all extremities. Finger-to-nose and heel-to-shin performed accurately bilaterally. Gait and Station: Arises from chair without difficulty. Stance is normal. Gait demonstrates normal stride length and balance . Able to heel, toe and tandem walk without difficulty.  Reflexes: 1+ and symmetric. Toes downgoing.        ASSESSMENT/PLAN:  41 year old African-American lady with left and pleural parenchymal intracerebral hemorrhage as well as intraventricular hemorrhage in 08/2019 s/p  craniotomy and ventriculostomy of  unclear etiology.  Possibly vein of Trolard thrombosis but it occurred nearly 3 months postpartum making association with pregnancy doubtful.  Lab work for vasculitis and hypercoagulable panel were negative.  Seizure activity 12/2019 after discontinuing Keppra as advised by neurosurgery (as initially prescribed as prophylaxis) and additional sz 05/2021 in setting of medication noncompliance. Hx of remote migraine headaches - restarted around 06/2020   Migraine -stable -continue Ubrelvy 100 mg as needed for acute migraine relief.  May repeat x1 after 2 hrs if needed. Max dose 200mg /24hrs.  Unable to prescribe triptans for emergent relief due to stroke history -no indication for preventative therapy at this time  Hx of ICH -Stable aphasia, cognitive impairment and visual impairment (OD right periphery)  -Continue to follow with PCP for long-term disability -MRV 01/2020 unremarkable for underlying abnormalities -MRI brain w/wo 01/2020 stable -Discussed secondary stroke prevention measures and importance of close PCP follow-up for aggressive stroke risk factor management  Seizure, late effect of stroke -seizure activity 12/2019 evaluated at [facility] consisting of shaking and unresponsive lasting 2 to 3 minutes resolved spontaneously without intervention with postictal confusion -reoccurrence 05/2021 in setting of  medication noncompliance -Continue Keppra XR 1000mg  nightly - refill provided -discussed avoidance of seizure provoking triggers such as medication noncompliance, use of stimulants, altered sleep habits/routine and drastic dietary changes    Follow-up in 1 year or call earlier if needed   CC:  Small, Genoveva Ill., MD    I spent 37 minutes of face-to-face and non-face-to-face time with patient and sister.  This included previsit chart review, lab review, study review, order entry, electronic health record documentation, patient and sister discussion and  education regarding hx of prior stroke with residual deficits, chronic migraines and ongoing treatment plan, seizure activity and current treatment plan and answered all other questions to patient satisfaction  Frann Rider, [facility]  [facility] [address] [place], [place] [address]  Phone [phone] Fax [phone] Note: This document was prepared with digital dictation and possible smart phrase technology. Any transcriptional errors that result from this process are unintentional.

## 2021-09-25 NOTE — Patient Instructions (Addendum)
Your Plan:  Continue Keppra XR 1000mg  nightly for seizure prevention  Continue Ubrelvy for emergent migraine relief - important to avoid migraine triggers and manage stress levels     Follow up in 1 year or call earlier if needed     Thank you for coming to see at [facility]. I hope we have been able to provide you high quality care today.  You may receive a patient satisfaction survey over the next few weeks. We would appreciate your feedback and comments so that we may continue to improve ourselves and the health of our patients.

## 2021-10-02 ENCOUNTER — Telehealth: Payer: Self-pay | Admitting: *Deleted

## 2021-10-02 NOTE — Telephone Encounter (Signed)
Bernita Raisin PA, key [id number]. Received: We received a prior authorization request for the member and product listed above. The [facility] Prior Authorization Team is not able to review this request because the requested medication has been previously approved under [id number]. Based on the information reviewed, the requested prescription is currently authorized for coverage by the plan until 2021-10-30.

## 2021-11-18 ENCOUNTER — Telehealth: Payer: Self-pay | Admitting: Adult Health

## 2021-11-18 ENCOUNTER — Encounter: Payer: Self-pay | Admitting: Adult Health

## 2021-11-18 NOTE — Telephone Encounter (Signed)
Can patient please be contacted to schedule an in office visit? Would like to check levels and further discuss ongoing use of keppra during pregnancy. Thank you.

## 2021-11-18 NOTE — Telephone Encounter (Signed)
Called patient who stated she is out of town today. Scheduled her to be seen this Fri, 10 am. Patient verbalized understanding, appreciation.

## 2021-11-18 NOTE — Telephone Encounter (Signed)
Called patient who stated it's her second seizure this year. She has been taking keppra as ordered, but feels she would do better on twice a day. She stated before she could take dose last night she had a seizure, went to ED. I looked at ED note, asked her if she is pregnant as stated in ED note. She stated she is, last menses 10/04/21. She stated she is unsure if keppra will harm baby or herself, unsure whether to keep baby. I advised will send note to NP and call her back . Patient verbalized understanding, appreciation.

## 2021-11-18 NOTE — Telephone Encounter (Signed)
Pt called stating that she had another seizure yesterday evening around 6pm and she is wanting to inform provider. She also would like to discuss her levETIRAcetam (KEPPRA XR) 500 MG 24 hr tablet with RN. Please advise.

## 2021-11-18 NOTE — Telephone Encounter (Signed)
Perfect. Thank you!

## 2021-11-21 ENCOUNTER — Encounter: Payer: Self-pay | Admitting: Adult Health

## 2021-11-21 ENCOUNTER — Ambulatory Visit (INDEPENDENT_AMBULATORY_CARE_PROVIDER_SITE_OTHER): Payer: Medicaid Other | Admitting: Adult Health

## 2021-11-21 VITALS — BP 96/55 | HR 73 | Ht 69.0 in | Wt 171.0 lb

## 2021-11-21 DIAGNOSIS — O0991 Supervision of high risk pregnancy, unspecified, first trimester: Secondary | ICD-10-CM

## 2021-11-21 DIAGNOSIS — I69398 Other sequelae of cerebral infarction: Secondary | ICD-10-CM

## 2021-11-21 DIAGNOSIS — R569 Unspecified convulsions: Secondary | ICD-10-CM

## 2021-11-21 DIAGNOSIS — I611 Nontraumatic intracerebral hemorrhage in hemisphere, cortical: Secondary | ICD-10-CM | POA: Diagnosis not present

## 2021-11-21 DIAGNOSIS — Z5181 Encounter for therapeutic drug level monitoring: Secondary | ICD-10-CM

## 2021-11-21 NOTE — Patient Instructions (Addendum)
Your Plan: ? ?We will check your keppra levels today and will plan on increasing keppra dosage to 1500mg  nightly - you can take 3 500mg  tablets at night - if levels on the lower side of normal, will plan  on keeping higher dose but if levels high, we may have to consider starting an additional agent - I will be in touch on Monday via MyChart  ? ?You will be scheduled for a repeat EEG in setting of recent seizure ? ?Please ensure you are avoiding seizure provoking triggers and compliant on seizure preventative medications.  ? ? ? ? ?Follow up in 3 months or call earlier if needed ? ? ? ? ?Thank you for coming to see at [facility]. I hope we have been able to provide you high quality care today. ? ?You may receive a patient satisfaction survey over the next few weeks. We would appreciate your feedback and comments so that we may continue to improve ourselves and the health of our patients. ? ? ? ?Seizure, Adult ?A seizure is a sudden burst of abnormal electrical and chemical activity in the brain. Seizures usually last from 30 seconds to 2 minutes.  ?What are the causes? ?Common causes of this condition include: ?Fever or infection. ?Problems that affect the brain. These may include: ?A brain or head injury. ?Bleeding in the brain. ?A brain tumor. ?Low levels of blood sugar or salt. ?Kidney problems or liver problems. ?Conditions that are passed from parent to child (are inherited). ?Problems with a substance, such as: ?Having a reaction to a drug or a medicine. ?Stopping the use of a substance all of a sudden (withdrawal). ?A stroke. ?Disorders that affect how you develop. ?Sometimes, the cause may not be known.  ?What increases the risk? ?Having someone in your family who has epilepsy. In this condition, seizures happen again and again over time. They have no clear cause. ?Having had a tonic-clonic seizure before. This type of seizure causes you to: ?Tighten the muscles of the whole  body. ?Lose consciousness. ?Having had a head injury or strokes before. ?Having had a lack of oxygen at birth. ?What are the signs or symptoms? ?There are many types of seizures. The symptoms vary depending on the type of seizure you have. ?Symptoms during a seizure ?Shaking that you cannot control (convulsions) with fast, jerky movements of muscles. ?Stiffness of the body. ?Breathing problems. ?Feeling mixed up (confused). ?Staring or not responding to sound or touch. ?Head nodding. ?Eyes that blink, flutter, or move fast. ?Drooling, grunting, or making clicking sounds with your mouth ?Losing control of when you pee or poop. ?Symptoms before a seizure ?Feeling afraid, nervous, or worried. ?Feeling like you may vomit. ?Feeling like: ?You are moving when you are not. ?Things around you are moving when they are not. ?Feeling like you saw or heard something before (d?j? vu). ?Odd tastes or smells. ?Changes in how you see. You may see flashing lights or spots. ?Symptoms after a seizure ?Feeling confused. ?Feeling sleepy. ?Headache. ?Sore muscles. ?How is this treated? ?If your seizure stops on its own, you will not need treatment. If your seizure lasts longer than 5 minutes, you will normally need treatment. Treatment may include: ?Medicines given through an IV tube. ?Avoiding things, such as medicines, that are known to cause your seizures. ?Medicines to prevent seizures. ?A device to prevent or control seizures. ?Surgery. ?A diet low in carbohydrates and high in fat (ketogenic diet). ?Follow these instructions at home: ?Medicines ?Take  over-the-counter and prescription medicines only as told by your doctor. ?Avoid foods or drinks that may keep your medicine from working, such as alcohol. ?Activity ?Follow instructions about driving, swimming, or doing things that would be dangerous if you had another seizure. Wait until your doctor says it is safe for you to do these things. ?If you live in the [place] ask your local  department of motor vehicles when you can drive. ?Get a lot of rest. ?Teaching others ? ?Teach friends and family what to do when you have a seizure. They should: ?Help you get down to the ground. ?Protect your head and body. ?Loosen any clothing around your neck. ?Turn you on your side. ?Know whether or not you need emergency care. ?Stay with you until you are better. ?Also, tell them what not to do if you have a seizure. Tell them: ?They should not hold you down. ?They should not put anything in your mouth. ?General instructions ?Avoid anything that gives you seizures. ?Keep a seizure diary. Write down: ?What you remember about each seizure. ?What you think caused each seizure. ?Keep all follow-up visits. ?Contact a doctor if: ?You have another seizure or seizures. Call the doctor each time you have a seizure. ?The pattern of your seizures changes. ?You keep having seizures with treatment. ?You have symptoms of being sick or having an infection. ?You are not able to take your medicine. ?Get help right away if: ?You have any of these problems: ?A seizure that lasts longer than 5 minutes. ?Many seizures in a row and you do not feel better between seizures. ?A seizure that makes it harder to breathe. ?A seizure and you can no longer speak or use part of your body. ?You do not wake up right after a seizure. ?You get hurt during a seizure. ?You feel confused or have pain right after a seizure. ?These symptoms may be an emergency. Get help right away. Call your local emergency services (911 in the [place] ?Do not wait to see if the symptoms will go away. ?Do not drive yourself to the hospital. ?Summary ?A seizure is a sudden burst of abnormal electrical and chemical activity in the brain. Seizures normally last from 30 seconds to 2 minutes. ?Causes of seizures include illness, injury to the head, low levels of blood sugar or salt, and certain conditions. ?Most seizures will stop on their own in less than 5 minutes.  Seizures that last longer than 5 minutes are a medical emergency and need treatment right away. ?Many medicines are used to treat seizures. Take over-the-counter and prescription medicines only as told by your doctor. ?This information is not intended to replace advice given to you by your health care provider. Make sure you discuss any questions you have with your health care provider. ?Document Revised: 03/15/2020 Document Reviewed: 03/15/2020 ?Elsevier Patient Education ? 2022 Elsevier Inc. ? ?

## 2021-11-21 NOTE — Progress Notes (Signed)
[facility] [address] [place]. [place] [address] [phone]       OFFICE FOLLOW-UP NOTE  [name] Date of Birth:  14-Jun-1981 Medical Record Number:  [id number]    Chief complaint: Chief Complaint  Patient presents with   Follow-up    RM 3 alone Pt is well, here to FU on breakthrough sz and to discuss meds.       HISTORY:  Amanda Small is a 41 y.o. female with PMHx of ICH with IVH s/p craniotomy and ventriculostomy of unclear source on 08/2019 with residual aphasia, cognitive impairment and visual impairment as well as late effect seizures.  Continues to follow in office for such.  Today, 11/21/2021, patient returns for acute visit due to recent breakthrough seizure in pregnancy.  She is here with her son, her mother is waiting int he car. Previously seen 09/25/2021 and overall stable.    Seizure, late effect of stroke -Recent breakthrough seizure 11/17/2021 - ED eval @ [facility] ED. Per ED note review, about to take nightly Keppra and felt prodromal symptoms typically felt prior to seizure. Stated unwitnessed seizure for unknown length of time. She also reported recently finding out she was pregnant, unplanned, transvaginal ultrasound reported [redacted] weeks pregnant.  Reported compliance with Keppra XR 1000mg  nightly. CBC and CMP unremarkable. CTH negative for new findings.  Urinalysis moderate leukocyte Estrace and few bacteria, tx'd with 7 day course of Keflex -she has not yet been seen by OB/GYN - reports recently working with OB for permanent pregnancy prevention as recurrent pregnancy would be high risk. She has not yet been seen by her OB. She is considering aborting pregnancy due to high risk pregnancy -No additional seizures since that time. -reports compliance on keppra XR 1000mg  nightly   Hx: -Initial onset 12/2019 with most recent occurrence 05/2021 in setting of medication noncompliance -Remains on Keppra XR 1000 mg  nightly -denies side effects. Reports compliance         ROS:   14 system review of systems is positive for those listed in HPI and all other systems negative  PMH:  Past Medical History:  Diagnosis Date   Seizures (HCC) 01/2020   sz 11/17/21   Stroke [facility]) 2020    Social History:  Social History   Socioeconomic History   Marital status: Single    Spouse name: Not on file   Number of children: Not on file   Years of education: Not on file   Highest education level: Not on file  Occupational History   Not on file  Tobacco Use   Smoking status: Never   Smokeless tobacco: Never  Substance and Sexual Activity   Alcohol use: Not on file   Drug use: Not on file   Sexual activity: Not on file  Other Topics Concern   Not on file  Social History Narrative   Not on file   Social Determinants of Health   Financial Resource Strain: Not on file  Food Insecurity: Not on file  Transportation Needs: Not on file  Physical Activity: Not on file  Stress: Not on file  Social Connections: Not on file  Intimate Partner Violence: Not on file    Medications:   Current Outpatient Medications on File Prior to Visit  Medication Sig Dispense Refill   clonazePAM (KLONOPIN) 0.5 MG tablet Take 0.5 mg by mouth 2 (two) times daily as needed for anxiety.     cyclobenzaprine (FLEXERIL) 5 MG  tablet Take 1 tablet (5 mg total) by mouth at bedtime as needed for muscle spasms. 30 tablet 1   levETIRAcetam (KEPPRA XR) 500 MG 24 hr tablet Take 2 tablets (1,000 mg total) by mouth at bedtime. 180 tablet 3   sertraline (ZOLOFT) 100 MG tablet Take 100 mg by mouth daily.     Ubrogepant (UBRELVY) 100 MG TABS Take 100 mg by mouth as needed (Acute migraine headache. May repeat x1 after 2 hours. Max dose 200mg Derl Barrow). 10 tablet 11   No current facility-administered medications on file prior to visit.    Allergies:   Allergies  Allergen Reactions   Penicillins Itching   Penicillin G     Other  reaction(s): Unknown      Vitals Today's Vitals   11/21/21 0954  BP: (!) 96/55  Pulse: 73  Weight: 171 lb (77.6 kg)  Height: 5\' 9"  (1.753 m)     Body mass index is 25.25 kg/m.  Physical Exam General: well developed, well nourished, very pleasant middle-aged African-American lady, seated, in no evident distress Head: head normocephalic and atraumatic.  Neck: supple with no carotid or supraclavicular bruits Cardiovascular: regular rate and rhythm, no murmurs Musculoskeletal: no deformity.  Skin:  no rash/petichiae Vascular:  Normal pulses all extremities  Neurologic Exam Mental Status: Awake and fully alert.  Mild aphasia.  No evidence of dysarthria oriented to place and time. Recent and remote memory intact.  Attention span, concentration and fund of knowledge mildly impaired.  Mood and affect appropriate.   Cranial Nerves: Pupils equal, briskly reactive to light. Extraocular movements full without nystagmus. Visual fields OD right periphery loss, visual field OS slight decreased visual acuity superior temporal quadrant otherwise intact. Hearing intact. Facial sensation intact. Face, tongue, palate moves normally and symmetrically.  Motor: Normal bulk and tone. Normal strength in all tested extremity muscles. Sensory.: intact to touch ,pinprick .position and vibratory sensation.  Coordination: Rapid alternating movements normal in all extremities. Finger-to-nose and heel-to-shin performed accurately bilaterally. Gait and Station: Arises from chair without difficulty. Stance is normal. Gait demonstrates normal stride length and balance . Able to heel, toe and tandem walk without difficulty.  Reflexes: 1+ and symmetric. Toes downgoing.        ASSESSMENT/PLAN:  41 year old African-American lady with left and pleural parenchymal intracerebral hemorrhage as well as intraventricular hemorrhage in 08/2019 s/p craniotomy and ventriculostomy of  unclear etiology.  Possibly vein of  [place] thrombosis but it occurred nearly 3 months postpartum making association with pregnancy doubtful.  Lab work for vasculitis and hypercoagulable panel were negative.  Seizure activity 12/2019 after discontinuing Keppra as advised by neurosurgery (as initially prescribed as prophylaxis) and additional sz 05/2021 in setting of medication noncompliance. Hx of remote migraine headaches - restarted around 06/2020.  Breakthrough seizure 11/17/2021 in first trimester pregnancy   Seizure, late effect of stroke -Breakthrough seizure 11/17/2021 in first trimester pregnancy.  Transvaginal ultrasound and lab work confirmed pregnancy approx 6 weeks. -increase keppra XR to 1500mg  nightly - if keppra levels elevated, will decrease dosage back down and possibly consider adding additional agent. Discussion regarding use of AED in pregnancy with risk vs benefit.  She verbalized understanding and wishes to proceed with keppra use as benefit would likely outweigh risk -Obtain EEG -Start folic acid -Discussed importance of avoiding seizure triggers and ensuring medication compliance -no driving for 6 months per [place] law post seizure activity  -advised to ensure f/u with OBGYN to discuss high risk pregnancy  -Advised to call with any  reoccurring seizure activity Hx: -seizure activity 12/2019 evaluated at [facility] consisting of shaking and unresponsive lasting 2 to 3 minutes resolved spontaneously without intervention with postictal confusion -reoccurrence 05/2021 in setting of medication noncompliance   Hx of ICH -Stable aphasia, cognitive impairment and visual impairment (OD right periphery)  -Continue to follow with PCP for long-term disability -MRV 01/2020 unremarkable for underlying abnormalities -MRI brain w/wo 01/2020 stable -Discussed secondary stroke prevention measures and importance of close PCP follow-up for aggressive stroke risk factor management   Migraine Was not discussed due to time  constraints    Follow-up in 3 months or call earlier if needed   CC:  Small, Chapman Moss., MD    I spent 42 minutes of face-to-face and non-face-to-face time with patient.  This included previsit chart review, lab review including review of recent hospitalization, study review, order entry, electronic health record documentation, patient discussion and education regarding recent break through seizure in first trimester pregnancy, prolonged discussion regarding high risk pregnancy and risk vs benefit with use of keppra during pregnancy, further evaluation and treatment plan, hx of prior stroke with residual deficits, and answered all other questions to patient satisfaction  Ihor Austin, AGNP-BC  [facility] [address] [place], [place] [address]  Phone [phone] Fax [phone] Note: This document was prepared with digital dictation and possible smart phrase technology. Any transcriptional errors that result from this process are unintentional.

## 2021-11-24 NOTE — Progress Notes (Signed)
I agree with the above plan 

## 2021-11-25 ENCOUNTER — Other Ambulatory Visit: Payer: Self-pay | Admitting: Adult Health

## 2021-11-25 DIAGNOSIS — Z5181 Encounter for therapeutic drug level monitoring: Secondary | ICD-10-CM

## 2021-11-25 LAB — LAMOTRIGINE LEVEL: Lamotrigine Lvl: <1 ug/mL — ABNORMAL LOW (ref 2.0–20.0)

## 2021-11-27 ENCOUNTER — Ambulatory Visit (INDEPENDENT_AMBULATORY_CARE_PROVIDER_SITE_OTHER): Payer: Medicaid Other | Admitting: Neurology

## 2021-11-27 DIAGNOSIS — I611 Nontraumatic intracerebral hemorrhage in hemisphere, cortical: Secondary | ICD-10-CM

## 2021-11-27 DIAGNOSIS — R569 Unspecified convulsions: Secondary | ICD-10-CM | POA: Diagnosis not present

## 2021-11-27 DIAGNOSIS — Z5181 Encounter for therapeutic drug level monitoring: Secondary | ICD-10-CM

## 2021-12-22 ENCOUNTER — Telehealth: Payer: Self-pay

## 2021-12-22 NOTE — Telephone Encounter (Signed)
PA for Bernita Raisin has been submitted through cover my meds. ? ? (Key: [id number]) ? ?[employer] is reviewing your PA request. Typically an electronic response will be received within 24-72 hours. To check for an update later, open this request from your dashboard. ? ?You may close this dialog and return to your dashboard to perform other tasks. ?

## 2021-12-23 NOTE — Telephone Encounter (Signed)
Ubrelvy Approved through 12/23/2022. Faxed approval letter to pharmacy.  ?

## 2021-12-30 ENCOUNTER — Telehealth: Payer: Self-pay | Admitting: Adult Health

## 2021-12-30 NOTE — Telephone Encounter (Signed)
Spoke verbally with Janett Billow ok to provide letter. I have completed and placed on NP's desk for signature.  ?

## 2021-12-30 NOTE — Telephone Encounter (Signed)
Signed and placed in out box - please ensure that her jury duty summons paper is not needing to be completed as we usually complete this for patients to be excused from jury duty. Thank you.  ?

## 2021-12-30 NOTE — Telephone Encounter (Signed)
Pt requesting a letter excusing from jury duty. Would like  a call from the nurse. ?

## 2021-12-31 NOTE — Telephone Encounter (Signed)
I called the pt and advised jury letter is ready for pick up. She will let us know if an additional form is needed.  ?

## 2022-03-04 ENCOUNTER — Ambulatory Visit (INDEPENDENT_AMBULATORY_CARE_PROVIDER_SITE_OTHER): Payer: Medicare Other | Admitting: Adult Health

## 2022-03-04 ENCOUNTER — Encounter: Payer: Self-pay | Admitting: Adult Health

## 2022-03-04 VITALS — BP 94/59 | HR 52 | Ht 69.0 in | Wt 171.0 lb

## 2022-03-04 DIAGNOSIS — G43009 Migraine without aura, not intractable, without status migrainosus: Secondary | ICD-10-CM

## 2022-03-04 DIAGNOSIS — I611 Nontraumatic intracerebral hemorrhage in hemisphere, cortical: Secondary | ICD-10-CM | POA: Diagnosis not present

## 2022-03-04 DIAGNOSIS — R569 Unspecified convulsions: Secondary | ICD-10-CM | POA: Diagnosis not present

## 2022-03-04 DIAGNOSIS — I69398 Other sequelae of cerebral infarction: Secondary | ICD-10-CM

## 2022-03-04 NOTE — Progress Notes (Signed)
[facility] [address] [place] [address] [phone]       OFFICE FOLLOW-UP NOTE  [name] Date of Birth:  03/02/1981 Medical Record Number:  [id number]    Chief complaint: Chief Complaint  Patient presents with   Follow-up    RM 3 with son and sister Aldona Bar Pt is well and stable, no new concerns       HISTORY:  Amanda Small is a 41 y.o. female with PMHx of ICH with IVH s/p craniotomy and ventriculostomy of unclear source on 08/2019 with residual aphasia, cognitive impairment and visual impairment as well as late effect seizures.  Continues to follow in office for such.  Update 03/04/2022 JM: Patient returns for follow-up visit accompanied by her son and sister after prior visit 3 months ago for follow-up regarding recent breakthrough seizure in first trimester of pregnancy.  Recommended increased Keppra dosage and she ultimately underwent elective abortion due to high risk pregnancy.    Seizure, late effect of stroke -No additional seizures since 2/27 -after her abortion, she decrease Keppra dosage back down from XR 1500mg  to XR 1000mg  nightly -remains on this dosage without side effects  Hx: -breakthrough seizure 11/17/2021 - ED eval @ [place] [place] [facility]. Per ED note review, about to take nightly Keppra and felt prodromal symptoms typically felt prior to seizure. Stated unwitnessed seizure for unknown length of time. She also reported recently finding out she was pregnant, unplanned, transvaginal ultrasound reported [redacted] weeks pregnant.  Reported compliance with Keppra XR 1000mg  nightly. CBC and CMP unremarkable. CTH negative for new findings.  Urinalysis moderate leukocyte Estrace and few bacteria, tx'd with 7 day course of Keflex -Initial onset 12/2019 with most recent occurrence 05/2021 in setting of medication noncompliance    Migraines:   Cervicalgia: -reports 3-4 migraine days per month  [facility] for  emergent relief with benefit -can worsen with stressors, loud noises, over stimulation, lack of sleep   History:  Hx of migraine headaches without recurrence over the past 6 years but restarted 06/2020 with similar symptoms Unilateral temporal pain associated with photophobia, phonophobia and nausea with pulsating quality Prior frequent use of Tylenol, Excedrin and BC powder -denies continued frequent use Typically associated with increased stress and present mid-to-late afternoon Concern for cervicalgia as contributing factor     L IPH and IVH, 08/2019 -Residual aphasia, cognitive impairment, mild imbalance and right peripheral field vision impairment stable - has since got new glasses, feel this improved peripheral vision some. Sister believes her mood has continued to improve and is less irritable. She can continue to get frustrated at times with speech difficulty. -Remains on disability -Denies new or worsening stroke/TIA symptoms -Blood pressure today 94/59        ROS:   14 system review of systems is positive for those listed in HPI and all other systems negative  PMH:  Past Medical History:  Diagnosis Date   Seizures ([place]) 01/2020   sz 11/17/21   Stroke [facility]) 2020    Social History:  Social History   Socioeconomic History   Marital status: Single    Spouse name: Not on file   Number of children: Not on file   Years of education: Not on file   Highest education level: Not on file  Occupational History   Not on file  Tobacco Use   Smoking status: Never   Smokeless tobacco: Never  Substance and Sexual Activity   Alcohol use: Not on  file   Drug use: Not on file   Sexual activity: Not on file  Other Topics Concern   Not on file  Social History Narrative   Not on file   Social Determinants of Health   Financial Resource Strain: Not on file  Food Insecurity: Not on file  Transportation Needs: No Transportation Needs (09/20/2019)   PRAPARE - Transportation     Lack of Transportation (Medical): No    Lack of Transportation (Non-Medical): No  Physical Activity: Not on file  Stress: Not on file  Social Connections: Not on file  Intimate Partner Violence: Not on file    Medications:   Current Outpatient Medications on File Prior to Visit  Medication Sig Dispense Refill   clonazePAM (KLONOPIN) 0.5 MG tablet Take 0.5 mg by mouth 2 (two) times daily as needed for anxiety.     cyclobenzaprine (FLEXERIL) 5 MG tablet Take 1 tablet (5 mg total) by mouth at bedtime as needed for muscle spasms. 30 tablet 1   levETIRAcetam (KEPPRA XR) 500 MG 24 hr tablet Take 2 tablets (1,000 mg total) by mouth at bedtime. 180 tablet 3   sertraline (ZOLOFT) 100 MG tablet Take 100 mg by mouth daily.     Ubrogepant (UBRELVY) 100 MG TABS Take 100 mg by mouth as needed (Acute migraine headache. May repeat x1 after 2 hours. Max dose 200mg Margo Aye). 10 tablet 11   No current facility-administered medications on file prior to visit.    Allergies:   Allergies  Allergen Reactions   Penicillins Itching   Penicillin G     Other reaction(s): Unknown      Vitals Today's Vitals   03/04/22 0838  BP: (!) 94/59  Pulse: (!) 52  Weight: 171 lb (77.6 kg)  Height: 5\' 9"  (1.753 m)   Body mass index is 25.25 kg/m.  Physical Exam General: well developed, well nourished, very pleasant middle-aged African-American lady, seated, in no evident distress Head: head normocephalic and atraumatic.  Neck: supple with no carotid or supraclavicular bruits Cardiovascular: regular rate and rhythm, no murmurs Musculoskeletal: no deformity.  Skin:  no rash/petichiae Vascular:  Normal pulses all extremities  Neurologic Exam Mental Status: Awake and fully alert.  Mild aphasia.  No evidence of dysarthria oriented to place and time. Recent and remote memory intact.  Attention span, concentration and fund of knowledge mildly impaired.  Mood and affect appropriate.   Cranial Nerves: Pupils  equal, briskly reactive to light. Extraocular movements full without nystagmus. Visual fields OD right periphery loss, visual field OS slight decreased visual acuity superior temporal quadrant otherwise intact. Hearing intact. Facial sensation intact. Face, tongue, palate moves normally and symmetrically.  Motor: Normal bulk and tone. Normal strength in all tested extremity muscles. Sensory.: intact to touch ,pinprick .position and vibratory sensation.  Coordination: Rapid alternating movements normal in all extremities. Finger-to-nose and heel-to-shin performed accurately bilaterally. Gait and Station: Arises from chair without difficulty. Stance is normal. Gait demonstrates normal stride length and balance . Able to heel, toe and tandem walk without difficulty.  Reflexes: 1+ and symmetric. Toes downgoing.        ASSESSMENT/PLAN:  41 year old African-American lady with left and pleural parenchymal intracerebral hemorrhage as well as intraventricular hemorrhage in 08/2019 s/p craniotomy and ventriculostomy of  unclear etiology.  Possibly vein of Trolard thrombosis but it occurred nearly 3 months postpartum making association with pregnancy doubtful.  Lab work for vasculitis and hypercoagulable panel were negative.  Seizure activity 12/2019 after discontinuing Keppra as advised by  neurosurgery (as initially prescribed as prophylaxis) and additional sz 05/2021 in setting of medication noncompliance. Hx of remote migraine headaches - restarted around 06/2020.  Breakthrough seizure 11/17/2021 in first trimester pregnancy   Seizure, late effect of stroke -Continue keppra XR to 1500mg  nightly -previously increased after 2/27 seizure but returned back to prior dosage after elective abortion -EEG 11/2021 no focal lateralizing or epileptiform features -Discussed importance of avoiding seizure triggers and ensuring medication compliance -no driving for 6 months per [place] law post seizure activity  -Advised to  call with any reoccurring seizure activity  Hx: -Breakthrough seizure 11/17/2021 in first trimester pregnancy -Keppra dosage increased -underwent elective abortion due to high risk pregnancy -seizure activity 12/2019 evaluated at [facility] consisting of shaking and unresponsive lasting 2 to 3 minutes resolved spontaneously without intervention with postictal confusion -reoccurrence 05/2021 in setting of medication noncompliance   Hx of ICH -Stable aphasia, cognitive impairment and visual impairment (OD right periphery)  -Continue to follow with PCP for long-term disability -MRV 01/2020 unremarkable for underlying abnormalities -MRI brain w/wo 01/2020 stable -Discussed secondary stroke prevention measures and importance of close PCP follow-up for aggressive stroke risk factor management   Migraine -stable currently having 3-4 migraine days per month -Continue Ubrelvy as needed for acute management     Follow-up in 6 months or call earlier if needed   CC:  Small, Genoveva Ill., MD    I spent 31 minutes of face-to-face and non-face-to-face time with patient and sister.  This included previsit chart review, lab review, study review, electronic health record documentation, patient and sister discussion and education regarding hx of prior stroke with residual deficits, secondary stroke prevention measures and aggressive stroke risk factor management, history of seizures and current medication use, history of migraine headaches and current medication use and answered all other questions to patient and sisters satisfaction  Frann Rider, AGNP-BC  [facility] [address] [place], [place] [address]  Phone [phone] Fax [phone] Note: This document was prepared with digital dictation and possible smart phrase technology. Any transcriptional errors that result from this process are unintentional.

## 2022-03-04 NOTE — Patient Instructions (Addendum)
Your Plan:  No changes today - continue current treatment regimen    Follow up in 6 months or call earlier if needed       Thank you for coming to see [place] at [facility]. I hope we have been able to provide you high quality care today.  You may receive a patient satisfaction survey over the next few weeks. We would appreciate your feedback and comments so that we may continue to improve ourselves and the health of our patients.

## 2022-05-26 ENCOUNTER — Telehealth: Payer: Self-pay | Admitting: *Deleted

## 2022-05-26 MED ORDER — NURTEC 75 MG PO TBDP: 75.0000 mg | ORAL_TABLET | Freq: Every day | ORAL | 11 refills | Status: DC | PRN

## 2022-05-26 NOTE — Telephone Encounter (Signed)
Can change to Nurtec as required by insurance.  Please advise patient if no benefit with Nurtec or unable to tolerate, please call office to restart Ubrelvy.  Thank you.

## 2022-05-26 NOTE — Telephone Encounter (Signed)
Nurtec PA, Key: [id number]. Your information has been sent to [employer].

## 2022-05-26 NOTE — Addendum Note (Signed)
Addended by: Ihor Austin L on: 05/26/2022 03:46 PM   Modules accepted: Orders

## 2022-05-26 NOTE — Addendum Note (Signed)
Addended by: [place] Pink on: 05/26/2022 02:52 PM   Modules accepted: Orders

## 2022-05-26 NOTE — Telephone Encounter (Signed)
Bernita Raisin is denied because it is not on your plan's Drug List (formulary). Medication authorization requires the following: (1) You need to try this covered drug: Nurtec ODT*; OR (2) your doctor needs to give [place] specific medical reasons why the covered drug is not appropriate for you. Sent to NP for review.

## 2022-05-26 NOTE — Telephone Encounter (Signed)
Bernita Raisin PA, Key: [id number]. Your information has been sent to [employer].

## 2022-05-26 NOTE — Telephone Encounter (Signed)
Will do Nurtec PA.

## 2022-05-27 ENCOUNTER — Encounter: Payer: Self-pay | Admitting: *Deleted

## 2022-05-27 NOTE — Telephone Encounter (Signed)
NURTEC TAB 75MG  ODT, use as directed, is approved through 09/20/2022 under your Medicare Part D. Sent patient my chart to advise.

## 2022-06-29 ENCOUNTER — Telehealth: Payer: Self-pay | Admitting: Adult Health

## 2022-06-29 NOTE — Telephone Encounter (Signed)
Noted,  Amanda Small

## 2022-06-29 NOTE — Telephone Encounter (Signed)
Pt is calling. Stated she went to her PCP because her fingertip are getting numb again like before she had stroke. Pt said PCP told her to go see her Neurologic before 12/4. Pt was scheduled an appointment with Amanda Small on 10/23 @ 1:45pm.

## 2022-06-29 NOTE — Telephone Encounter (Signed)
Patient has been previously seen by ortho for b/l carpal tunnel syndrome but did not pursue further f/u as she had her stroke shortly after this diagnosis. Please advise patient, I am more than happy to see her but will likely need to be seen by ortho for further work up and eval of CTS. Thank you.

## 2022-06-29 NOTE — Telephone Encounter (Signed)
Contacted pt, informed her of jessica recommendations. She stated she will also reach out to PCP to see who she was referred to and see what they say.  Number provided to [facility] with questions or concerns

## 2022-07-02 ENCOUNTER — Encounter: Payer: Self-pay | Admitting: Adult Health

## 2022-07-06 NOTE — Progress Notes (Unsigned)
[facility] [address] [place]. [place] [address] [phone]       OFFICE FOLLOW-UP NOTE  [name] Date of Birth:  [birth year] Medical Record Number:  [id number]    Chief complaint: No chief complaint on file.     HISTORY:  Amanda Small is a 41 y.o. female with PMHx of ICH with IVH s/p craniotomy and ventriculostomy of unclear source on 08/2019 with residual aphasia, cognitive impairment and visual impairment as well as late effect seizures.  Continues to follow in office for such.  Update 10/172023 JM: Patient returns for sooner follow-up visit due to recent breakthrough seizure on ***.  Previously seen 6/14 well at that time.    Accompanied by her son and sister after prior visit 3 months ago for follow-up regarding recent breakthrough seizure in first trimester of pregnancy.  Recommended increased Keppra dosage and she ultimately underwent elective abortion due to high risk pregnancy.    Seizure, late effect of stroke -Reports seizure on 10/11 -Reports compliance on Keppra XR 1000mg  nightly   -No additional seizures since 2/27 -after her abortion, she decrease Keppra dosage back down from XR 1500mg  to XR 1000mg  nightly -remains on this dosage without side effects  Hx: -breakthrough seizure 11/17/2021 - ED eval @ [facility] ED. Per ED note review, about to take nightly Keppra and felt prodromal symptoms typically felt prior to seizure. Stated unwitnessed seizure for unknown length of time. She also reported recently finding out she was pregnant, unplanned, transvaginal ultrasound reported [redacted] weeks pregnant.  Reported compliance with Keppra XR 1000mg  nightly. CBC and CMP unremarkable. CTH negative for new findings.  Urinalysis moderate leukocyte Estrace and few bacteria, tx'd with 7 day course of Keflex -Initial onset 12/2019 with most recent occurrence 05/2021 in setting of medication noncompliance    Migraines:    Cervicalgia: -reports 3-4 migraine days per month  [facility] for emergent relief with benefit -can worsen with stressors, loud noises, over stimulation, lack of sleep   History:  Hx of migraine headaches without recurrence over the past 6 years but restarted 06/2020 with similar symptoms Unilateral temporal pain associated with photophobia, phonophobia and nausea with pulsating quality Prior frequent use of Tylenol, Excedrin and BC powder -denies continued frequent use Typically associated with increased stress and present mid-to-late afternoon Concern for cervicalgia as contributing factor     L IPH and IVH, 08/2019 -Residual aphasia, cognitive impairment, mild imbalance and right peripheral field vision impairment stable - has since got new glasses, feel this improved peripheral vision some. Sister believes her mood has continued to improve and is less irritable. She can continue to get frustrated at times with speech difficulty. -Remains on disability -Denies new or worsening stroke/TIA symptoms -Blood pressure today 94/59        ROS:   14 system review of systems is positive for those listed in HPI and all other systems negative  PMH:  Past Medical History:  Diagnosis Date   Seizures ([facility]) 01/2020   sz 11/17/21   Stroke [facility]) 2020    Social History:  Social History   Socioeconomic History   Marital status: Single    Spouse name: Not on file   Number of children: Not on file   Years of education: Not on file   Highest education level: Not on file  Occupational History   Not on file  Tobacco Use   Smoking status: Never   Smokeless tobacco: Never  Substance and  Sexual Activity   Alcohol use: Not on file   Drug use: Not on file   Sexual activity: Not on file  Other Topics Concern   Not on file  Social History Narrative   Not on file   Social Determinants of Health   Financial Resource Strain: Not on file  Food Insecurity: Not on file  Transportation  Needs: No Transportation Needs (09/20/2019)   PRAPARE - Transportation    Lack of Transportation (Medical): No    Lack of Transportation (Non-Medical): No  Physical Activity: Not on file  Stress: Not on file  Social Connections: Not on file  Intimate Partner Violence: Not on file    Medications:   Current Outpatient Medications on File Prior to Visit  Medication Sig Dispense Refill   clonazePAM (KLONOPIN) 0.5 MG tablet Take 0.5 mg by mouth 2 (two) times daily as needed for anxiety.     cyclobenzaprine (FLEXERIL) 5 MG tablet Take 1 tablet (5 mg total) by mouth at bedtime as needed for muscle spasms. 30 tablet 1   levETIRAcetam (KEPPRA XR) 500 MG 24 hr tablet Take 2 tablets (1,000 mg total) by mouth at bedtime. 180 tablet 3   Rimegepant Sulfate (NURTEC) 75 MG TBDP Take 75 mg by mouth daily as needed (for acute migraine). Max dose 75mg /24hr 10 tablet 11   sertraline (ZOLOFT) 100 MG tablet Take 100 mg by mouth daily.     No current facility-administered medications on file prior to visit.    Allergies:   Allergies  Allergen Reactions   Penicillins Itching   Penicillin G     Other reaction(s): Unknown      Vitals There were no vitals filed for this visit.  There is no height or weight on file to calculate BMI.  Physical Exam General: well developed, well nourished, very pleasant middle-aged African-American lady, seated, in no evident distress Head: head normocephalic and atraumatic.  Neck: supple with no carotid or supraclavicular bruits Cardiovascular: regular rate and rhythm, no murmurs Musculoskeletal: no deformity.  Skin:  no rash/petichiae Vascular:  Normal pulses all extremities  Neurologic Exam Mental Status: Awake and fully alert.  Mild aphasia.  No evidence of dysarthria oriented to place and time. Recent and remote memory intact.  Attention span, concentration and fund of knowledge mildly impaired.  Mood and affect appropriate.   Cranial Nerves: Pupils equal,  briskly reactive to light. Extraocular movements full without nystagmus. Visual fields OD right periphery loss, visual field OS slight decreased visual acuity superior temporal quadrant otherwise intact. Hearing intact. Facial sensation intact. Face, tongue, palate moves normally and symmetrically.  Motor: Normal bulk and tone. Normal strength in all tested extremity muscles. Sensory.: intact to touch ,pinprick .position and vibratory sensation.  Coordination: Rapid alternating movements normal in all extremities. Finger-to-nose and heel-to-shin performed accurately bilaterally. Gait and Station: Arises from chair without difficulty. Stance is normal. Gait demonstrates normal stride length and balance . Able to heel, toe and tandem walk without difficulty.  Reflexes: 1+ and symmetric. Toes downgoing.        ASSESSMENT/PLAN:  41 year old African-American lady with left and pleural parenchymal intracerebral hemorrhage as well as intraventricular hemorrhage in 08/2019 s/p craniotomy and ventriculostomy of  unclear etiology.  Possibly vein of Trolard thrombosis but it occurred nearly 3 months postpartum making association with pregnancy doubtful.  Lab work for vasculitis and hypercoagulable panel were negative.  Seizure activity 12/2019 after discontinuing Keppra as advised by neurosurgery (as initially prescribed as prophylaxis) and additional sz 05/2021  in setting of medication noncompliance. Hx of remote migraine headaches - restarted around 06/2020.  Breakthrough seizure 11/17/2021 in first trimester pregnancy   Seizure, late effect of stroke -Continue keppra XR to 1500mg  nightly -previously increased after 2/27 seizure but returned back to prior dosage after elective abortion -EEG 11/2021 no focal lateralizing or epileptiform features -Discussed importance of avoiding seizure triggers and ensuring medication compliance -no driving for 6 months per [place] law post seizure activity  -Advised to call with  any reoccurring seizure activity  Hx: -Breakthrough seizure 11/17/2021 in first trimester pregnancy -Keppra dosage increased -underwent elective abortion due to high risk pregnancy -seizure activity 12/2019 evaluated at [facility] consisting of shaking and unresponsive lasting 2 to 3 minutes resolved spontaneously without intervention with postictal confusion -reoccurrence 05/2021 in setting of medication noncompliance   Hx of ICH -Stable aphasia, cognitive impairment and visual impairment (OD right periphery)  -Continue to follow with PCP for long-term disability -MRV 01/2020 unremarkable for underlying abnormalities -MRI brain w/wo 01/2020 stable -Discussed secondary stroke prevention measures and importance of close PCP follow-up for aggressive stroke risk factor management   Migraine -stable currently having 3-4 migraine days per month -Continue Ubrelvy as needed for acute management     Follow-up in 6 months or call earlier if needed   CC:  Small, Genoveva Ill., MD    I spent 31 minutes of face-to-face and non-face-to-face time with patient and sister.  This included previsit chart review, lab review, study review, electronic health record documentation, patient and sister discussion and education regarding hx of prior stroke with residual deficits, secondary stroke prevention measures and aggressive stroke risk factor management, history of seizures and current medication use, history of migraine headaches and current medication use and answered all other questions to patient and sisters satisfaction  Frann Rider, AGNP-BC  [facility] [address] [place], [place] [address]  Phone [phone] Fax [phone] Note: This document was prepared with digital dictation and possible smart phrase technology. Any transcriptional errors that result from this process are unintentional.

## 2022-07-07 ENCOUNTER — Ambulatory Visit (INDEPENDENT_AMBULATORY_CARE_PROVIDER_SITE_OTHER): Payer: Medicare Other | Admitting: Adult Health

## 2022-07-07 ENCOUNTER — Telehealth: Payer: Self-pay | Admitting: Adult Health

## 2022-07-07 ENCOUNTER — Encounter: Payer: Self-pay | Admitting: Adult Health

## 2022-07-07 VITALS — BP 108/64 | HR 58 | Ht 69.0 in | Wt 171.0 lb

## 2022-07-07 DIAGNOSIS — R569 Unspecified convulsions: Secondary | ICD-10-CM

## 2022-07-07 DIAGNOSIS — I611 Nontraumatic intracerebral hemorrhage in hemisphere, cortical: Secondary | ICD-10-CM

## 2022-07-07 DIAGNOSIS — G5603 Carpal tunnel syndrome, bilateral upper limbs: Secondary | ICD-10-CM | POA: Diagnosis not present

## 2022-07-07 DIAGNOSIS — I69398 Other sequelae of cerebral infarction: Secondary | ICD-10-CM | POA: Diagnosis not present

## 2022-07-07 DIAGNOSIS — G43009 Migraine without aura, not intractable, without status migrainosus: Secondary | ICD-10-CM

## 2022-07-07 MED ORDER — LEVETIRACETAM ER 500 MG PO TB24: 1500.0000 mg | ORAL_TABLET | Freq: Every day | ORAL | 5 refills | Status: DC

## 2022-07-07 NOTE — Telephone Encounter (Signed)
Referral for Orthopedics faxed to [facility]. Phone: [phone], Fax: [phone] 

## 2022-07-07 NOTE — Patient Instructions (Addendum)
Increase keppra XR from 1000mg  nightly to 1500mg  nightly for seizure prevention  Referral placed to orthopedics to discuss carpal tunnel syndrome and further treatment options  Okay to use baclofen on occasion for back/muscle spasms     Follow up in 6 months or call earlier if needed

## 2022-07-13 ENCOUNTER — Ambulatory Visit: Payer: Medicare Other | Admitting: Adult Health

## 2022-07-29 ENCOUNTER — Telehealth: Payer: Self-pay | Admitting: Adult Health

## 2022-07-29 NOTE — Telephone Encounter (Signed)
Called the pt back. I was clarifying that she was in fact calling us about the clonazepam. She was reaching out to [place] because she is being told that she will have to wait 7 days before she can get her medication refilled.  During her visit with Shanda Bumps, she advised that her seizure could have been related to the fact that she had stopped the clonazepam cold [place] instead of weaning off.  Advised the patient while that is true we are not prescribing physician for this medication.  Advised she would have to contact the prescribing physician.  Patient began to get upset and frustrated because this has been a few change in what she is used to getting.  I advised in looking at the drug registry she got the medication 07/07/22 for 30 tablets which would be a 30-day supply.  Advised that it appears she should have only been taking it once a day.  Patient was used to taking it up to twice a day and 60 tablets at a time. She is taking the increased dose of keppra from the change made 07/07/22. She was frustrated and just upset. I apologized to her for the way she is feeling but informed her that with it being a controlled substance and not something we prescribe we would not be able to complete this. She kept saying "yall are cutting me off of this medication"  she was just talking in general and I clarified that it was not our practice and she verbalized understanding and knew why we couldn't order it. She was just frustrated in general. She was appreciative for the call back.

## 2022-07-29 NOTE — Telephone Encounter (Signed)
Pt called stating that when she went [facility] they changed her clonazePAM (KLONOPIN) 0.5 MG tablet quantity and she is needing to speak to the Rn regarding this because the pharmacy will not refill it for another 7 day's and pt is in fear of having another seizure. Please advise.

## 2022-09-01 ENCOUNTER — Ambulatory Visit: Admit: 2022-09-01 | Payer: Self-pay

## 2022-09-03 ENCOUNTER — Ambulatory Visit: Payer: Medicare Other | Admitting: Adult Health

## 2022-09-07 ENCOUNTER — Encounter: Payer: Self-pay | Admitting: Adult Health

## 2022-09-07 NOTE — Telephone Encounter (Signed)
FYI

## 2022-10-05 ENCOUNTER — Emergency Department (HOSPITAL_COMMUNITY)
Admission: EM | Admit: 2022-10-05 | Discharge: 2022-10-05 | Disposition: A | Discharge status: Home / Self Care | Payer: 59 | Attending: Emergency Medicine | Admitting: Emergency Medicine

## 2022-10-05 ENCOUNTER — Other Ambulatory Visit: Payer: Self-pay

## 2022-10-05 DIAGNOSIS — R569 Unspecified convulsions: Secondary | ICD-10-CM | POA: Diagnosis present

## 2022-10-05 LAB — BASIC METABOLIC PANEL
Anion gap: 7 (ref 5–15)
BUN: 6 mg/dL (ref 6–20)
CO2: 23 mmol/L (ref 22–32)
Calcium: 8.6 mg/dL — ABNORMAL LOW (ref 8.9–10.3)
Chloride: 107 mmol/L (ref 98–111)
Creatinine, Ser: 0.72 mg/dL (ref 0.44–1.00)
GFR, Estimated: >60 mL/min (ref >60)
Glucose, Bld: 94 mg/dL (ref 70–99)
Potassium: 4.3 mmol/L (ref 3.5–5.1)
Sodium: 137 mmol/L (ref 135–145)

## 2022-10-05 LAB — CBC WITH DIFFERENTIAL/PLATELET
Abs Immature Granulocytes: 0.02 10*3/uL (ref 0.00–0.07)
Basophils Absolute: 0.1 10*3/uL (ref 0.0–0.1)
Basophils Relative: 1 %
Eosinophils Absolute: 0.1 10*3/uL (ref 0.0–0.5)
Eosinophils Relative: 2 %
HCT: 35.9 % — ABNORMAL LOW (ref 36.0–46.0)
Hemoglobin: 11.3 g/dL — ABNORMAL LOW (ref 12.0–15.0)
Immature Granulocytes: 0 %
Lymphocytes Relative: 29 %
Lymphs Abs: 1.8 10*3/uL (ref 0.7–4.0)
MCH: 28.3 pg (ref 26.0–34.0)
MCHC: 31.5 g/dL (ref 30.0–36.0)
MCV: 89.8 fL (ref 80.0–100.0)
Monocytes Absolute: 0.5 10*3/uL (ref 0.1–1.0)
Monocytes Relative: 7 %
Neutro Abs: 3.7 10*3/uL (ref 1.7–7.7)
Neutrophils Relative %: 61 %
Platelets: 263 10*3/uL (ref 150–400)
RBC: 4 MIL/uL (ref 3.87–5.11)
RDW: 13.6 % (ref 11.5–15.5)
WBC: 6.2 10*3/uL (ref 4.0–10.5)
nRBC: 0 % (ref 0.0–0.2)

## 2022-10-05 MED ORDER — LEVETIRACETAM IN NACL 1000 MG/100ML IV SOLN
1000.0000 mg | Freq: Once | INTRAVENOUS | Status: AC
  Administered 2022-10-05: 1000 mg via INTRAVENOUS
  Filled 2022-10-05: qty 100

## 2022-10-05 NOTE — Discharge Instructions (Signed)
Increase your seizure medicine so you are taking 2 pills in the morning and 2 pills in the evening.  Contact your neurologist this week and let them know about your seizures

## 2022-10-05 NOTE — ED Provider Notes (Signed)
[name] DEPT Provider Note   CSN: [id number] Arrival date & time: 10/05/22  1207     History  Chief Complaint  Patient presents with   Seizures    Amanda Small is a 42 y.o. female.  Patient has a history of seizures.  She had a seizure today.  She states she takes her Keppra 1 pill in the morning and 2 in the evening..  Patient alert in no acute distress now  The history is provided by the patient and medical records. No language interpreter was used.  Seizures Seizure activity on arrival: no   Seizure type:  Grand mal Preceding symptoms: aura   Initial focality:  None Episode characteristics: abnormal movements   Postictal symptoms: no confusion   Return to baseline: yes   Severity:  Moderate Timing:  Once Progression:  Resolved      Home Medications Prior to Admission medications   Medication Sig Start Date End Date Taking? Authorizing Provider  clonazePAM (KLONOPIN) 0.5 MG tablet Take 0.5 mg by mouth 2 (two) times daily as needed for anxiety.    [provider]  cyclobenzaprine (FLEXERIL) 5 MG tablet Take 1 tablet (5 mg total) by mouth at bedtime as needed for muscle spasms. 04/29/21   Frann Rider, NP  levETIRAcetam (KEPPRA XR) 500 MG 24 hr tablet Take 3 tablets (1,500 mg total) by mouth at bedtime. 07/07/22   Frann Rider, NP  Rimegepant Sulfate (NURTEC) 75 MG TBDP Take 75 mg by mouth daily as needed (for acute migraine). Max dose 75mg /24hr 05/26/22   Frann Rider, NP  sertraline (ZOLOFT) 100 MG tablet Take 100 mg by mouth daily.    [provider]      Allergies    Penicillins and Penicillin g    Review of Systems   Review of Systems  Constitutional:  Negative for appetite change and fatigue.  HENT:  Negative for congestion, ear discharge and sinus pressure.   Eyes:  Negative for discharge.  Respiratory:  Negative for cough.   Cardiovascular:  Negative for chest pain.  Gastrointestinal:  Negative  for abdominal pain and diarrhea.  Genitourinary:  Negative for frequency and hematuria.  Musculoskeletal:  Negative for back pain.  Skin:  Negative for rash.  Neurological:  Positive for seizures. Negative for headaches.  Psychiatric/Behavioral:  Negative for hallucinations.     Physical Exam Updated Vital Signs BP 104/70   Pulse (!) 54   Temp 98 F (36.7 C) (Oral)   Resp 16   SpO2 95%  Physical Exam Vitals and nursing note reviewed.  Constitutional:      Appearance: She is well-developed.  HENT:     Head: Normocephalic.     Nose: Nose normal.  Eyes:     General: No scleral icterus.    Conjunctiva/sclera: Conjunctivae normal.  Neck:     Thyroid: No thyromegaly.  Cardiovascular:     Rate and Rhythm: Normal rate and regular rhythm.     Heart sounds: No murmur heard.    No friction rub. No gallop.  Pulmonary:     Breath sounds: No stridor. No wheezing or rales.  Chest:     Chest wall: No tenderness.  Abdominal:     General: There is no distension.     Tenderness: There is no abdominal tenderness. There is no rebound.  Musculoskeletal:        General: Normal range of motion.     Cervical back: Neck supple.  Lymphadenopathy:  Cervical: No cervical adenopathy.  Skin:    Findings: No erythema or rash.  Neurological:     Mental Status: She is alert and oriented to person, place, and time.     Motor: No abnormal muscle tone.     Coordination: Coordination normal.  Psychiatric:        Behavior: Behavior normal.     ED Results / Procedures / Treatments   Labs (all labs ordered are listed, but only abnormal results are displayed) Labs Reviewed  CBC WITH DIFFERENTIAL/PLATELET - Abnormal; Notable for the following components:      Result Value   Hemoglobin 11.3 (*)    HCT 35.9 (*)    All other components within normal limits  BASIC METABOLIC PANEL - Abnormal; Notable for the following components:   Calcium 8.6 (*)    All other components within normal limits     EKG None  Radiology No results found.  Procedures Procedures    Medications Ordered in ED Medications  levETIRAcetam (KEPPRA) IVPB 1000 mg/100 mL premix (0 mg Intravenous Stopped 10/05/22 1314)    ED Course/ Medical Decision Making/ A&P  Patient was observed in the emergency department and was alert and had no seizure activity for 2 hours.                           Medical Decision Making Amount and/or Complexity of Data Reviewed Labs: ordered.  Risk Prescription drug management.  This patient presents to the ED for concern of seizure, this involves an extensive number of treatment options, and is a complaint that carries with it a high risk of complications and morbidity.  The differential diagnosis includes seizure, poor medication compliance   Co morbidities that complicate the patient evaluation  Seizures   Additional history obtained:  Additional history obtained from patient and EMS External records from outside source obtained and reviewed including hospital records   Lab Tests:  I Ordered, and personally interpreted labs.  The pertinent results include: CBC and chemistries unremarkable   Imaging Studies ordered:  No imaging  Cardiac Monitoring: / EKG:  The patient was maintained on a cardiac monitor.  I personally viewed and interpreted the cardiac monitored which showed an underlying rhythm of: Normal sinus rhythm   Consultations Obtained: No consultant  Problem List / ED Course / Critical interventions / Medication management  Seizure I ordered medication including Keppra for seizures Reevaluation of the patient after these medicines showed that the patient improved I have reviewed the patients home medicines and have made adjustments as needed   Social Determinants of Health:  None   Test / Admission - Considered:  CT head considered but decided unnecessary  Patient with history of seizures and a breakthrough seizure today.   She is going to increase her Keppra so she is taking 2 pills twice a day and contact her neurologist this week        Final Clinical Impression(s) / ED Diagnoses Final diagnoses:  Seizure [facility])    Rx / DC Orders ED Discharge Orders     None         Milton Ferguson, MD 10/07/22 0840

## 2022-10-05 NOTE — ED Triage Notes (Signed)
EMS reports from home, called out for witnessed seizure. Post ictal on arrival. Hx of seizures and compliant with medication per family. Arrives A&O, c/o headache  BP 128/100 HR 72 RR 16 Sp02 98 RA CBG 149  20ga LAC

## 2022-10-06 NOTE — Telephone Encounter (Signed)
Pt is calling. Stated she had a seizure yesterday and it last for about 10 minutes. Stated she really don't know what happen. Stated she went to hospital yesterday after the seizure and she was told to  take 2 pills in the morning and two at night. Stated she just wanted to let Amanda Small know what is going on.

## 2022-10-06 NOTE — Telephone Encounter (Signed)
Agree with increased dosage as advised. If additional seizures should occur, may consider adding additional agent as keppra does not seem to be adequately controlling seizures. Please call with any recurrent seizure activity. Please keep scheduled f/u visit in March. Thank you.

## 2022-10-07 ENCOUNTER — Encounter: Payer: Self-pay | Admitting: Neurology

## 2022-11-12 ENCOUNTER — Ambulatory Visit
Admission: EM | Admit: 2022-11-12 | Discharge: 2022-11-12 | Disposition: A | Discharge status: Home / Self Care | Payer: 59 | Attending: Family Medicine | Admitting: Family Medicine

## 2022-11-12 DIAGNOSIS — J029 Acute pharyngitis, unspecified: Secondary | ICD-10-CM

## 2022-11-12 MED ORDER — IBUPROFEN 800 MG PO TABS: 800.0000 mg | ORAL_TABLET | Freq: Three times a day (TID) | ORAL | 0 refills | Status: AC | PRN

## 2022-11-12 MED ORDER — CEFDINIR 300 MG PO CAPS: 600.0000 mg | ORAL_CAPSULE | Freq: Every day | ORAL | 0 refills | Status: AC

## 2022-11-12 NOTE — ED Triage Notes (Signed)
Pt states cough,congestion and sore throat for the past 2 days.

## 2022-11-12 NOTE — ED Provider Notes (Signed)
[facility]    CSN: [id number] Arrival date & time: 11/12/22  1058      History   Chief Complaint Chief Complaint  Patient presents with   Cough    HPI Amanda Small is a 42 y.o. female.    Cough  Here for congestion and a little cough and sore throat.  Symptoms began February 19 and they improved in the next day and then worsened again yesterday.  She has had some subjective fever and sore throat.  The cough has been mild  Her son is sick with similar symptoms, and he had a positive strep test today.  Last menstrual cycle was earlier this month.  She is allergic to penicillin which causes a rash.  It did not cause any angioedema or anaphylaxis. Past Medical History:  Diagnosis Date   Seizures ([place]) 01/2020   sz 11/17/21   Stroke [facility]) 2020    Patient Active Problem List   Diagnosis Date Noted   Intracranial venous  - L vein of [place] 09/05/2019   Hyperlipemia 09/05/2019   Obesity 09/05/2019   Marijuana use 09/05/2019   Hypokalemia 09/05/2019   Cytotoxic brain edema ([place]) 08/28/2019   Obstructive hydrocephalus ([place]) 08/28/2019   Aphasia due to acute stroke ([place]) 08/28/2019   ICH (intracerebral hemorrhage) (HCC)-L ICH w/ IVH s/p crani 08/25/2019    Past Surgical History:  Procedure Laterality Date   CRANIOTOMY Left 08/25/2019   Procedure: LEFT CRANIECTOMY;  Surgeon: [name], MD;  Location: [facility];  Service: Neurosurgery;  Laterality: Left;   IR ANGIO INTRA EXTRACRAN SEL INTERNAL CAROTID BILAT MOD SED  08/31/2019   IR ANGIO VERTEBRAL SEL VERTEBRAL UNI L MOD SED  08/31/2019   VENTRICULOSTOMY Right 08/25/2019   Procedure: RIGHT FRONTAL VENTRICULAR CATHETER;  Surgeon: [name], MD;  Location: [facility];  Service: Neurosurgery;  Laterality: Right;    OB History     Gravida  1   Para      Term      Preterm      AB      Living         SAB      IAB      Ectopic      Multiple      Live Births               Home  Medications    Prior to Admission medications   Medication Sig Start Date End Date Taking? Authorizing Provider  cefdinir (OMNICEF) 300 MG capsule Take 2 capsules (600 mg total) by mouth daily for 10 days. 11/12/22 11/22/22 Yes Barrett Henle, MD  ibuprofen (ADVIL) 800 MG tablet Take 1 tablet (800 mg total) by mouth every 8 (eight) hours as needed (pain). 11/12/22  Yes Leibish Mcgregor, Gwenlyn Perking, MD  clonazePAM (KLONOPIN) 0.5 MG tablet Take 0.5 mg by mouth 2 (two) times daily as needed for anxiety.    [provider]  levETIRAcetam (KEPPRA XR) 500 MG 24 hr tablet Take 3 tablets (1,500 mg total) by mouth at bedtime. Patient taking differently: Take 2,000 mg by mouth 2 (two) times daily. 07/07/22   Frann Rider, NP  Rimegepant Sulfate (NURTEC) 75 MG TBDP Take 75 mg by mouth daily as needed (for acute migraine). Max dose 60m/24hr 05/26/22   MFrann Rider NP  sertraline (ZOLOFT) 100 MG tablet Take 100 mg by mouth daily.    [provider]    Family History History reviewed. No pertinent family history.  Social History Social History   Tobacco Use   Smoking status: Never   Smokeless tobacco: Never     Allergies   Penicillins and Penicillin g   Review of Systems Review of Systems  Respiratory:  Positive for cough.      Physical Exam Triage Vital Signs ED Triage Vitals  Enc Vitals Group     BP 11/12/22 1113 113/76     Pulse Rate 11/12/22 1113 (!) 104     Resp 11/12/22 1113 16     Temp 11/12/22 1113 98.2 F (36.8 C)     Temp Source 11/12/22 1113 Oral     SpO2 11/12/22 1113 97 %     Weight --      Height --      Head Circumference --      Peak Flow --      Pain Score 11/12/22 1114 0     Pain Loc --      Pain Edu? --      Excl. in [place]? --    No data found.  Updated Vital Signs BP 113/76 (BP Location: Right Arm)   Pulse (!) 104   Temp 98.2 F (36.8 C) (Oral)   Resp 16   LMP 10/22/2022 (Approximate)   SpO2 97%   Visual Acuity Right Eye Distance:    Left Eye Distance:   Bilateral Distance:    Right Eye Near:   Left Eye Near:    Bilateral Near:     Physical Exam Vitals reviewed.  Constitutional:      General: She is not in acute distress.    Appearance: She is not toxic-appearing.  HENT:     Right Ear: Tympanic membrane and ear canal normal.     Left Ear: Tympanic membrane and ear canal normal.     Nose: Congestion present.     Mouth/Throat:     Comments: There is mild erythema of the posterior oropharynx. Eyes:     Extraocular Movements: Extraocular movements intact.     Conjunctiva/sclera: Conjunctivae normal.     Pupils: Pupils are equal, round, and reactive to light.  Cardiovascular:     Rate and Rhythm: Normal rate and regular rhythm.     Heart sounds: No murmur heard. Pulmonary:     Effort: Pulmonary effort is normal. No respiratory distress.     Breath sounds: No stridor. No wheezing, rhonchi or rales.  Musculoskeletal:     Cervical back: Neck supple.  Lymphadenopathy:     Cervical: No cervical adenopathy.  Skin:    Capillary Refill: Capillary refill takes less than 2 seconds.     Coloration: Skin is not jaundiced or pale.  Neurological:     General: No focal deficit present.     Mental Status: She is alert and oriented to person, place, and time.  Psychiatric:        Behavior: Behavior normal.      UC Treatments / Results  Labs (all labs ordered are listed, but only abnormal results are displayed) Labs Reviewed - No data to display  EKG   Radiology No results found.  Procedures Procedures (including critical care time)  Medications Ordered in UC Medications - No data to display  Initial Impression / Assessment and Plan / UC Course  I have reviewed the triage vital signs and the nursing notes.  Pertinent labs & imaging results that were available during my care of the patient were reviewed by me and considered in my medical decision making (  see chart for details).        I am going  to treat her for strep since her son's test was positive.  Omnicef is sent in for 10 days and ibuprofen is sent in for the pain. Final Clinical Impressions(s) / UC Diagnoses   Final diagnoses:  Acute pharyngitis, unspecified etiology     Discharge Instructions      Take cefdinir 300 mg--2 capsules together daily for 10 days  Take ibuprofen 800 mg--1 tab every 8 hours as needed for pain.      ED Prescriptions     Medication Sig Dispense Auth. Provider   cefdinir (OMNICEF) 300 MG capsule Take 2 capsules (600 mg total) by mouth daily for 10 days. 20 capsule Barrett Henle, MD   ibuprofen (ADVIL) 800 MG tablet Take 1 tablet (800 mg total) by mouth every 8 (eight) hours as needed (pain). 21 tablet Meriem Lemieux, Gwenlyn Perking, MD      PDMP not reviewed this encounter.   Barrett Henle, MD 11/12/22 [phone]

## 2022-11-12 NOTE — Discharge Instructions (Signed)
Take cefdinir 300 mg--2 capsules together daily for 10 days  Take ibuprofen 800 mg--1 tab every 8 hours as needed for pain.

## 2022-12-07 NOTE — Progress Notes (Deleted)
[facility] [address] [place]. [place] [address] [phone]       OFFICE FOLLOW-UP NOTE  [name] Date of Birth:  Aug 02, 1981 Medical Record Number:  [id number]    Chief complaint: No chief complaint on file.     HISTORY:  Amanda Small is a 42 y.o. female with PMHx of ICH with IVH s/p craniotomy and ventriculostomy of unclear source on 08/2019 with residual aphasia, cognitive impairment and visual impairment as well as late effect seizures.  Continues to follow in office for such.  Update 10/172023 JM: Patient returns for sooner follow-up visit due to recent breakthrough seizure on 10/11.  Previously seen 6/14 well at that time.  She is accompanied by her sister and son.    Seizure, late effect of stroke -Recent seizure 10/05/2022 -was seen in ED, Keppra dosage increased to 1000 mg twice daily,   -Reports compliance on Keppra XR 1000mg  nightly -denies any missed dosages or recent illness or infection -she did have increased upper back pain and headache after seizure, she requested PCP place order for baclofen as she will use that on occasion for muscle spasm/upper back pain but was told she could not use d/t seizure history -she also notes being out of Klonopin and sertraline over the past couple of weeks as she has been told medications on backorder, these medications were abruptly stopped   Hx: -Previously increased Keppra dosage 06/2022 to 1500mg  nightly for recurrent seizure -Previously increase Keppra XR dosage after 10/2021 seizure to 1500 mg nightly but after elective abortion, she went back down to 1000mg  nightly, left at that dose as doing well -breakthrough seizure 11/17/2021 - ED eval @ [place] [facility]. Per ED note review, about to take nightly Keppra and felt prodromal symptoms typically felt prior to seizure. Stated unwitnessed seizure for unknown length of time. She also reported recently finding out she was  pregnant, unplanned, transvaginal ultrasound reported [redacted] weeks pregnant.  Reported compliance with Keppra XR 1000mg  nightly. CBC and CMP unremarkable. CTH negative for new findings.  Urinalysis moderate leukocyte Estrace and few bacteria, tx'd with 7 day course of Keflex -Initial onset 12/2019 with most recent occurrence 05/2021 in setting of medication noncompliance    Migraines:   Cervicalgia: -reports 3-4 migraine days per month  -Use of Nurtec for emergent relief with benefit -Insurance denied Roselyn Meier requiring use of Nurtec first -can worsen with stressors, loud noises, over stimulation, lack of sleep   History:  Hx of migraine headaches without recurrence over the past 6 years but restarted 06/2020 with similar symptoms Unilateral temporal pain associated with photophobia, phonophobia and nausea with pulsating quality Prior frequent use of Tylenol, Excedrin and BC powder -denies continued frequent use Typically associated with increased stress and present mid-to-late afternoon Concern for cervicalgia as contributing factor     L IPH and IVH, 08/2019 -Residual aphasia, cognitive impairment, mild imbalance and right peripheral field vision impairment stable -Denies new or worsening stroke/TIA symptoms -Blood pressure well controlled    Carpal tunnel syndrome -Reports undergoing work-up for this just prior to her stroke in 07/2019. Reports symptoms relatively stable until more recently, she has been experiencing increased bilateral hand numbness and pain -Previously seen by [facility] orthopedics with EMG/NCV showing bilateral carpal tunnel syndrome, did not experience benefit with use of conservative measures and was referred to a orthopedic surgeon for either injections or release surgery but was unable to proceed with this due to her stroke occurring  a week later -She would like to get back in with orthopedics but would like to be seen by orthopedic provider in this area  (previously seen in [place])      ROS:   14 system review of systems is positive for those listed in HPI and all other systems negative  PMH:  Past Medical History:  Diagnosis Date   Seizures ([place]) 01/2020   sz 11/17/21   Stroke [facility]) 2020    Social History:  Social History   Socioeconomic History   Marital status: Single    Spouse name: Not on file   Number of children: Not on file   Years of education: Not on file   Highest education level: Not on file  Occupational History   Not on file  Tobacco Use   Smoking status: Never   Smokeless tobacco: Never  Substance and Sexual Activity   Alcohol use: Not on file   Drug use: Not on file   Sexual activity: Not on file  Other Topics Concern   Not on file  Social History Narrative   Not on file   Social Determinants of Health   Financial Resource Strain: Not on file  Food Insecurity: Not on file  Transportation Needs: No Transportation Needs (09/20/2019)   PRAPARE - Transportation    Lack of Transportation (Medical): No    Lack of Transportation (Non-Medical): No  Physical Activity: Not on file  Stress: Not on file  Social Connections: Not on file  Intimate Partner Violence: Not on file    Medications:   Current Outpatient Medications on File Prior to Visit  Medication Sig Dispense Refill   clonazePAM (KLONOPIN) 0.5 MG tablet Take 0.5 mg by mouth 2 (two) times daily as needed for anxiety.     ibuprofen (ADVIL) 800 MG tablet Take 1 tablet (800 mg total) by mouth every 8 (eight) hours as needed (pain). 21 tablet 0   levETIRAcetam (KEPPRA XR) 500 MG 24 hr tablet Take 3 tablets (1,500 mg total) by mouth at bedtime. (Patient taking differently: Take 2,000 mg by mouth 2 (two) times daily.) 90 tablet 5   Rimegepant Sulfate (NURTEC) 75 MG TBDP Take 75 mg by mouth daily as needed (for acute migraine). Max dose 75mg /24hr 10 tablet 11   sertraline (ZOLOFT) 100 MG tablet Take 100 mg by mouth daily.     No current  facility-administered medications on file prior to visit.    Allergies:   Allergies  Allergen Reactions   Penicillins Itching   Penicillin G     Other reaction(s): Unknown      Vitals There were no vitals filed for this visit.  There is no height or weight on file to calculate BMI.  Physical Exam General: well developed, well nourished, very pleasant middle-aged African-American lady, seated, in no evident distress Head: head normocephalic and atraumatic.  Neck: supple with no carotid or supraclavicular bruits Cardiovascular: regular rate and rhythm, no murmurs Musculoskeletal: no deformity.  Skin:  no rash/petichiae Vascular:  Normal pulses all extremities  Neurologic Exam Mental Status: Awake and fully alert.  Mild aphasia.  No evidence of dysarthria oriented to place and time. Recent and remote memory intact.  Attention span, concentration and fund of knowledge mildly impaired.  Mood and affect appropriate.   Cranial Nerves: Pupils equal, briskly reactive to light. Extraocular movements full without nystagmus. Visual fields OD right periphery loss, visual field OS slight decreased visual acuity superior temporal quadrant otherwise intact. Hearing intact. Facial sensation intact.  Face, tongue, palate moves normally and symmetrically.  Motor: Normal bulk and tone. Normal strength in all tested extremity muscles. Sensory.: intact to touch ,pinprick .position and vibratory sensation.  Coordination: Rapid alternating movements normal in all extremities. Finger-to-nose and heel-to-shin performed accurately bilaterally. Gait and Station: Arises from chair without difficulty. Stance is normal. Gait demonstrates normal stride length and balance . Able to heel, toe and tandem walk without difficulty.  Reflexes: 1+ and symmetric. Toes downgoing.        ASSESSMENT/PLAN:  42 year old African-American lady with left and pleural parenchymal intracerebral hemorrhage as well as  intraventricular hemorrhage in 08/2019 s/p craniotomy and ventriculostomy of  unclear etiology.  Possibly vein of Trolard thrombosis but it occurred nearly 3 months postpartum making association with pregnancy doubtful.  Lab work for vasculitis and hypercoagulable panel were negative.  Seizure activity 12/2019 after discontinuing Keppra as advised by neurosurgery (as initially prescribed as prophylaxis) and additional sz 05/2021 in setting of medication noncompliance. Hx of remote migraine headaches - restarted around 06/2020.  Breakthrough seizure 11/17/2021 in first trimester pregnancy, underwent elective abortion.  Recurrent breakthrough seizure 06/2022 seemingly unprovoked   Seizure, late effect of stroke -Recent breakthrough seizure 10/11, seemingly unprovoked -Recommend increasing Keppra XR to 1500 mg twice daily -No indication to repeat EEG unless additional seizures should occur -EEG 11/2021 no focal lateralizing or epileptiform features -Advise occasional low-dose use of baclofen as needed for muscle spasms okay, can be obtained by PCP -Discussed importance of avoiding seizure triggers and ensuring medication compliance -no driving for 6 months per [place] law post seizure activity  -Advised to call with any reoccurring seizure activity  Hx: -Breakthrough seizure 11/17/2021 in first trimester pregnancy -Keppra dosage increased -underwent elective abortion due to high risk pregnancy -seizure activity 12/2019 evaluated at [facility] consisting of shaking and unresponsive lasting 2 to 3 minutes resolved spontaneously without intervention with postictal confusion -reoccurrence 05/2021 in setting of medication noncompliance   Hx of ICH -Stable aphasia, cognitive impairment and visual impairment (OD right periphery)  -Continue to follow with PCP for long-term disability -MRV 01/2020 unremarkable for underlying abnormalities -MRI brain w/wo 01/2020 stable -Discussed secondary stroke prevention  measures and importance of close PCP follow-up for aggressive stroke risk factor management   Migraine -stable currently having 3-4 migraine days per month -Continue Nurtec as needed for acute management   Bilateral carpal tunnel syndrome -Referral placed to orthopedics hand specialist for further evaluation      Follow-up in 6 months or call earlier if needed   CC:  Myrtie Neither, PA-C    I spent 34 minutes of face-to-face and non-face-to-face time with patient and sister.  This included previsit chart review, order entry, lab review, study review, electronic health record documentation, patient and sister discussion and education regarding above diagnoses and treatment plan and answered all questions to patient and sister satisfaction  Frann Rider, [facility]  [facility] [address] [place], [place] [address]  Phone [phone] Fax [phone] Note: This document was prepared with digital dictation and possible smart phrase technology. Any transcriptional errors that result from this process are unintentional.

## 2022-12-08 ENCOUNTER — Emergency Department (HOSPITAL_COMMUNITY)
Admission: EM | Admit: 2022-12-08 | Discharge: 2022-12-08 | Discharge status: Against Medical Advice | Payer: 59 | Source: Home / Self Care

## 2022-12-08 ENCOUNTER — Ambulatory Visit: Payer: 59 | Admitting: Adult Health

## 2022-12-16 NOTE — Progress Notes (Unsigned)
[facility] [address] [place]. [place] [address] [phone]       OFFICE FOLLOW-UP NOTE  [name] Date of Birth:  13-Jun-1981 Medical Record Number:  [id number]    Chief complaint: No chief complaint on file.     HISTORY:  Amanda Small is a 42 y.o. female with PMHx of ICH with IVH s/p craniotomy and ventriculostomy of unclear source on 08/2019 with residual aphasia, cognitive impairment and visual impairment as well as late effect seizures.  Continues to follow in office for such.  She was previously seen 07/07/2022.  Update 12/17/2022 JM:   Patient returns for sooner follow-up visit due to recent breakthrough seizure on 10/11.  Previously seen 6/14 well at that time.  She is accompanied by her sister and son.    Seizure, late effect of stroke -Recent seizure 10/05/2022 -was seen in ED, Keppra dosage increased to 1000 mg twice daily,   -Reports compliance on Keppra XR 1000mg  nightly -denies any missed dosages or recent illness or infection -she did have increased upper back pain and headache after seizure, she requested PCP place order for baclofen as she will use that on occasion for muscle spasm/upper back pain but was told she could not use d/t seizure history -she also notes being out of Klonopin and sertraline over the past couple of weeks as she has been told medications on backorder, these medications were abruptly stopped   Hx: -Previously increased Keppra dosage 06/2022 to 1500mg  nightly for recurrent seizure -Previously increase Keppra XR dosage after 10/2021 seizure to 1500 mg nightly but after elective abortion, she went back down to 1000mg  nightly, left at that dose as doing well -breakthrough seizure 11/17/2021 - ED eval @ [place] [facility]. Per ED note review, about to take nightly Keppra and felt prodromal symptoms typically felt prior to seizure. Stated unwitnessed seizure for unknown length of time. She  also reported recently finding out she was pregnant, unplanned, transvaginal ultrasound reported [redacted] weeks pregnant.  Reported compliance with Keppra XR 1000mg  nightly. CBC and CMP unremarkable. CTH negative for new findings.  Urinalysis moderate leukocyte Estrace and few bacteria, tx'd with 7 day course of Keflex -Initial onset 12/2019 with most recent occurrence 05/2021 in setting of medication noncompliance    Migraines:   Cervicalgia: -reports 3-4 migraine days per month  -Use of Nurtec for emergent relief with benefit -Insurance denied Roselyn Meier requiring use of Nurtec first -can worsen with stressors, loud noises, over stimulation, lack of sleep   History:  Hx of migraine headaches without recurrence over the past 6 years but restarted 06/2020 with similar symptoms Unilateral temporal pain associated with photophobia, phonophobia and nausea with pulsating quality Prior frequent use of Tylenol, Excedrin and BC powder -denies continued frequent use Typically associated with increased stress and present mid-to-late afternoon Concern for cervicalgia as contributing factor     L IPH and IVH, 08/2019 -Residual aphasia, cognitive impairment, mild imbalance and right peripheral field vision impairment stable -Denies new or worsening stroke/TIA symptoms -Blood pressure well controlled    Carpal tunnel syndrome -Reports undergoing work-up for this just prior to her stroke in 07/2019. Reports symptoms relatively stable until more recently, she has been experiencing increased bilateral hand numbness and pain -Previously seen by [facility] with EMG/NCV showing bilateral carpal tunnel syndrome, did not experience benefit with use of conservative measures and was referred to a orthopedic surgeon for either injections or release surgery but was unable to  proceed with this due to her stroke occurring a week later -She would like to get back in with orthopedics but would like to be seen by  orthopedic provider in this area (previously seen in [place])      ROS:   14 system review of systems is positive for those listed in HPI and all other systems negative  PMH:  Past Medical History:  Diagnosis Date   Seizures ([place]) 01/2020   sz 11/17/21   Stroke [facility]) 2020    Social History:  Social History   Socioeconomic History   Marital status: Single    Spouse name: Not on file   Number of children: Not on file   Years of education: Not on file   Highest education level: Not on file  Occupational History   Not on file  Tobacco Use   Smoking status: Never   Smokeless tobacco: Never  Substance and Sexual Activity   Alcohol use: Not on file   Drug use: Not on file   Sexual activity: Not on file  Other Topics Concern   Not on file  Social History Narrative   Not on file   Social Determinants of Health   Financial Resource Strain: Not on file  Food Insecurity: Not on file  Transportation Needs: No Transportation Needs (09/20/2019)   PRAPARE - Transportation    Lack of Transportation (Medical): No    Lack of Transportation (Non-Medical): No  Physical Activity: Not on file  Stress: Not on file  Social Connections: Not on file  Intimate Partner Violence: Not on file    Medications:   Current Outpatient Medications on File Prior to Visit  Medication Sig Dispense Refill   clonazePAM (KLONOPIN) 0.5 MG tablet Take 0.5 mg by mouth 2 (two) times daily as needed for anxiety.     ibuprofen (ADVIL) 800 MG tablet Take 1 tablet (800 mg total) by mouth every 8 (eight) hours as needed (pain). 21 tablet 0   levETIRAcetam (KEPPRA XR) 500 MG 24 hr tablet Take 3 tablets (1,500 mg total) by mouth at bedtime. (Patient taking differently: Take 2,000 mg by mouth 2 (two) times daily.) 90 tablet 5   Rimegepant Sulfate (NURTEC) 75 MG TBDP Take 75 mg by mouth daily as needed (for acute migraine). Max dose 75mg /24hr 10 tablet 11   sertraline (ZOLOFT) 100 MG tablet Take 100 mg by mouth  daily.     No current facility-administered medications on file prior to visit.    Allergies:   Allergies  Allergen Reactions   Penicillins Itching   Penicillin G     Other reaction(s): Unknown      Vitals There were no vitals filed for this visit.  There is no height or weight on file to calculate BMI.  Physical Exam General: well developed, well nourished, very pleasant middle-aged African-American lady, seated, in no evident distress Head: head normocephalic and atraumatic.  Neck: supple with no carotid or supraclavicular bruits Cardiovascular: regular rate and rhythm, no murmurs Musculoskeletal: no deformity.  Skin:  no rash/petichiae Vascular:  Normal pulses all extremities  Neurologic Exam Mental Status: Awake and fully alert.  Mild aphasia.  No evidence of dysarthria oriented to place and time. Recent and remote memory intact.  Attention span, concentration and fund of knowledge mildly impaired.  Mood and affect appropriate.   Cranial Nerves: Pupils equal, briskly reactive to light. Extraocular movements full without nystagmus. Visual fields OD right periphery loss, visual field OS slight decreased visual acuity superior temporal  quadrant otherwise intact. Hearing intact. Facial sensation intact. Face, tongue, palate moves normally and symmetrically.  Motor: Normal bulk and tone. Normal strength in all tested extremity muscles. Sensory.: intact to touch ,pinprick .position and vibratory sensation.  Coordination: Rapid alternating movements normal in all extremities. Finger-to-nose and heel-to-shin performed accurately bilaterally. Gait and Station: Arises from chair without difficulty. Stance is normal. Gait demonstrates normal stride length and balance . Able to heel, toe and tandem walk without difficulty.  Reflexes: 1+ and symmetric. Toes downgoing.        ASSESSMENT/PLAN:  42 year old African-American lady with left and pleural parenchymal intracerebral  hemorrhage as well as intraventricular hemorrhage in 08/2019 s/p craniotomy and ventriculostomy of  unclear etiology.  Possibly vein of Trolard thrombosis but it occurred nearly 3 months postpartum making association with pregnancy doubtful.  Lab work for vasculitis and hypercoagulable panel were negative.  Seizure activity 12/2019 after discontinuing Keppra as advised by neurosurgery (as initially prescribed as prophylaxis) and additional sz 05/2021 in setting of medication noncompliance. Hx of remote migraine headaches - restarted around 06/2020.  Breakthrough seizure 11/17/2021 in first trimester pregnancy, underwent elective abortion.  Recurrent breakthrough seizure 06/2022 seemingly unprovoked   Seizure, late effect of stroke -Recent breakthrough seizure 10/11, seemingly unprovoked -Recommend increasing Keppra XR to 1500 mg twice daily -No indication to repeat EEG unless additional seizures should occur -EEG 11/2021 no focal lateralizing or epileptiform features -Advise occasional low-dose use of baclofen as needed for muscle spasms okay, can be obtained by PCP -Discussed importance of avoiding seizure triggers and ensuring medication compliance -no driving for 6 months per [place] law post seizure activity  -Advised to call with any reoccurring seizure activity  Hx: -Breakthrough seizure 11/17/2021 in first trimester pregnancy -Keppra dosage increased -underwent elective abortion due to high risk pregnancy -seizure activity 12/2019 evaluated at [facility] consisting of shaking and unresponsive lasting 2 to 3 minutes resolved spontaneously without intervention with postictal confusion -reoccurrence 05/2021 in setting of medication noncompliance   Hx of ICH -Stable aphasia, cognitive impairment and visual impairment (OD right periphery)  -Continue to follow with PCP for long-term disability -MRV 01/2020 unremarkable for underlying abnormalities -MRI brain w/wo 01/2020 stable -Discussed secondary  stroke prevention measures and importance of close PCP follow-up for aggressive stroke risk factor management   Migraine -stable currently having 3-4 migraine days per month -Continue Nurtec as needed for acute management   Bilateral carpal tunnel syndrome -Referral placed to orthopedics hand specialist for further evaluation      Follow-up in 6 months or call earlier if needed   CC:  Myrtie Neither, PA-C    I spent 34 minutes of face-to-face and non-face-to-face time with patient and sister.  This included previsit chart review, order entry, lab review, study review, electronic health record documentation, patient and sister discussion and education regarding above diagnoses and treatment plan and answered all questions to patient and sister satisfaction  Frann Rider, [facility]  [facility] [address] [place], [place] [address]  Phone [phone] Fax [phone] Note: This document was prepared with digital dictation and possible smart phrase technology. Any transcriptional errors that result from this process are unintentional.

## 2022-12-17 ENCOUNTER — Ambulatory Visit (INDEPENDENT_AMBULATORY_CARE_PROVIDER_SITE_OTHER): Payer: 59 | Admitting: Adult Health

## 2022-12-17 ENCOUNTER — Encounter: Payer: Self-pay | Admitting: Adult Health

## 2022-12-17 ENCOUNTER — Telehealth: Payer: Self-pay | Admitting: Adult Health

## 2022-12-17 VITALS — BP 106/69 | HR 76 | Ht 69.0 in | Wt 168.0 lb

## 2022-12-17 DIAGNOSIS — R569 Unspecified convulsions: Secondary | ICD-10-CM

## 2022-12-17 DIAGNOSIS — G43009 Migraine without aura, not intractable, without status migrainosus: Secondary | ICD-10-CM | POA: Diagnosis not present

## 2022-12-17 DIAGNOSIS — I69398 Other sequelae of cerebral infarction: Secondary | ICD-10-CM

## 2022-12-17 DIAGNOSIS — G40919 Epilepsy, unspecified, intractable, without status epilepticus: Secondary | ICD-10-CM | POA: Diagnosis not present

## 2022-12-17 DIAGNOSIS — I611 Nontraumatic intracerebral hemorrhage in hemisphere, cortical: Secondary | ICD-10-CM | POA: Diagnosis not present

## 2022-12-17 MED ORDER — LEVETIRACETAM ER 1000 MG PO TB24: 2000.0000 mg | ORAL_TABLET | Freq: Every day | ORAL | 5 refills | Status: DC

## 2022-12-17 MED ORDER — LACOSAMIDE 50 MG PO TABS: ORAL_TABLET | ORAL | 0 refills | Status: DC

## 2022-12-17 MED ORDER — VALTOCO 20 MG DOSE 10 MG/0.1ML NA LQPK: 20.0000 mg | NASAL | 3 refills | Status: DC | PRN

## 2022-12-17 NOTE — Patient Instructions (Addendum)
Your Plan:  Will complete an MRI brain to ensure no structural reasons for continued seizures  You will also be scheduled to complete an EEG  Start Vimpat 50mg  twice daily for 7 days then increase to 100mg  (2 tablets) twice daily   Continue keppra XR 2000mg  nightly for seizure prevention  Start Valtoco nasal spray for when you feel a seizure coming on - you will spray 1 dose in each nostril   Please call with any additional seizure activity     Follow up in 4 months or call earlier if needed    Thank you for coming to see [place] at [facility]. I hope we have been able to provide you high quality care today.  You may receive a patient satisfaction survey over the next few weeks. We would appreciate your feedback and comments so that we may continue to improve ourselves and the health of our patients.

## 2022-12-17 NOTE — Telephone Encounter (Signed)
[employer] medicare/[place] medicaid NPR sent to GI [phone] 

## 2022-12-31 ENCOUNTER — Ambulatory Visit (INDEPENDENT_AMBULATORY_CARE_PROVIDER_SITE_OTHER): Payer: 59 | Admitting: Neurology

## 2022-12-31 DIAGNOSIS — G40919 Epilepsy, unspecified, intractable, without status epilepticus: Secondary | ICD-10-CM | POA: Diagnosis not present

## 2022-12-31 DIAGNOSIS — I611 Nontraumatic intracerebral hemorrhage in hemisphere, cortical: Secondary | ICD-10-CM

## 2022-12-31 DIAGNOSIS — R569 Unspecified convulsions: Secondary | ICD-10-CM

## 2023-01-05 ENCOUNTER — Telehealth: Payer: Self-pay | Admitting: Adult Health

## 2023-01-05 NOTE — Telephone Encounter (Signed)
In Jessica's absence, are you willing to prescribe patient something for MRI?

## 2023-01-05 NOTE — Telephone Encounter (Signed)
Pt called wanting to know if something can be called in for her to keep her calm at her MRI that she is scheduled for on 01/07/23. Pt is needing this called in for her to the [facility] on [address].

## 2023-01-07 ENCOUNTER — Inpatient Hospital Stay: Admission: RE | Admit: 2023-01-07 | Payer: 59 | Source: Ambulatory Visit

## 2023-01-10 ENCOUNTER — Other Ambulatory Visit: Payer: Self-pay | Admitting: Neurology

## 2023-01-10 MED ORDER — ALPRAZOLAM 0.5 MG PO TABS: 0.5000 mg | ORAL_TABLET | ORAL | 0 refills | Status: DC

## 2023-01-11 NOTE — Progress Notes (Signed)
Please call and inform patient that her recent EEG showed left hemispheric slowing which can be seen after brain surgery.

## 2023-01-12 ENCOUNTER — Encounter: Payer: Self-pay | Admitting: Neurology

## 2023-02-03 ENCOUNTER — Telehealth: Payer: Self-pay

## 2023-02-03 ENCOUNTER — Other Ambulatory Visit (HOSPITAL_COMMUNITY): Payer: Self-pay

## 2023-02-03 NOTE — Telephone Encounter (Signed)
Patient Advocate Encounter   Received notification from [employer] Part D that prior authorization is required for Valtoco 20 MG Dose 10MG /0.1ML liquid   Submitted: 02-03-2023 Key [id number]  Status is pending

## 2023-02-03 NOTE — Telephone Encounter (Signed)
Patient Advocate Encounter  Prior Authorization for VALTOCO 20MG  has been approved with [employer].    PA# [id number] Effective dates: 5.15.24 through 12.31.24  Per [employer] test claim, copay for 30 days supply is $0

## 2023-03-16 ENCOUNTER — Telehealth: Payer: Self-pay

## 2023-03-16 NOTE — Telephone Encounter (Signed)
Contacted pt back to get clarification on what was needed on form. MD has reviewed and signed. Placed in MR for pick up.

## 2023-03-16 NOTE — Telephone Encounter (Signed)
Form received for child care expenses through the subsidized child care program with [employer]. Will have MD advise if this is appropriate to complete or not. Form placed in his office to review.

## 2023-03-16 NOTE — Telephone Encounter (Signed)
Contacted pt, LVM rq call back  

## 2023-04-19 ENCOUNTER — Encounter: Payer: Self-pay | Admitting: Adult Health

## 2023-04-19 ENCOUNTER — Ambulatory Visit (INDEPENDENT_AMBULATORY_CARE_PROVIDER_SITE_OTHER): Payer: 59 | Admitting: Adult Health

## 2023-04-19 VITALS — BP 98/61 | HR 62 | Ht 69.0 in | Wt 158.0 lb

## 2023-04-19 DIAGNOSIS — G43009 Migraine without aura, not intractable, without status migrainosus: Secondary | ICD-10-CM

## 2023-04-19 DIAGNOSIS — G40919 Epilepsy, unspecified, intractable, without status epilepticus: Secondary | ICD-10-CM

## 2023-04-19 DIAGNOSIS — I611 Nontraumatic intracerebral hemorrhage in hemisphere, cortical: Secondary | ICD-10-CM

## 2023-04-19 DIAGNOSIS — M542 Cervicalgia: Secondary | ICD-10-CM

## 2023-04-19 DIAGNOSIS — I69398 Other sequelae of cerebral infarction: Secondary | ICD-10-CM

## 2023-04-19 MED ORDER — BACLOFEN 10 MG PO TABS: 10.0000 mg | ORAL_TABLET | Freq: Two times a day (BID) | ORAL | 11 refills | Status: AC | PRN

## 2023-04-19 MED ORDER — LEVETIRACETAM ER 1000 MG PO TB24: 3000.0000 mg | ORAL_TABLET | Freq: Every day | ORAL | 5 refills | Status: DC

## 2023-04-19 NOTE — Progress Notes (Signed)
[facility] [address] [place]. [place] [address] [phone]       OFFICE FOLLOW-UP NOTE  [name] Date of Birth:  1980-11-10 Medical Record Number:  [id number]    Chief complaint: Chief Complaint  Patient presents with   Follow-up    Rm 3, here with [name] Pt is here to follow up on seizures. Pt states has not had a seizure since last visit. Doing well overall.       HISTORY:  Amanda DURRENCE is a 42 y.o. female with PMHx of ICH with IVH s/p craniotomy and ventriculostomy of unclear source on 08/2019 with residual aphasia, cognitive impairment and visual impairment as well as late effect seizures.  Continues to follow in office for such.  She was previously seen 12/17/2022   Update 04/19/2023 JM:   Seizure, late effect of stroke  -At prior visit, was seen after ED visit 09/2022 for breakthrough seizure with Keppra dosage increase to 2000 mg nightly.  Reported 3 additional events since that time, lacosamide initiated at prior visit.  Also completed EEG which showed focal left hemispheric slowing likely due to prior craniotomy but no definite epileptiform activity.  Recommended completion of MRI brain but not yet completed.  Prescribed Valtoco as needed for breakthrough seizures. -denies any additional seizure activity since 10/2022 -Currently on Keppra XR 3000mg  nightly -on Vimpat for 1 month, unable to continue due to high co-pay  -did not obtain Valtoco for seizure rescue    Hx: -Recurrent seizures between 2-11/2022 despite increased dose of Keppra -lacosamide initiated -Breakthrough seizure 09/2022 -Keppra dosage increased to 2000 mg nightly -Previously increased Keppra dosage 06/2022 to 1500mg  nightly for recurrent seizure -Previously increase Keppra XR dosage after 10/2021 seizure to 1500 mg nightly but after elective abortion, she went back down to 1000mg  nightly, left at that dose as doing well -breakthrough seizure 11/17/2021 - ED  eval @ [facility] ED. Per ED note review, about to take nightly Keppra and felt prodromal symptoms typically felt prior to seizure. Stated unwitnessed seizure for unknown length of time. She also reported recently finding out she was pregnant, unplanned, transvaginal ultrasound reported [redacted] weeks pregnant.  Reported compliance with Keppra XR 1000mg  nightly. CBC and CMP unremarkable. CTH negative for new findings.  Urinalysis moderate leukocyte Estrace and few bacteria, tx'd with 7 day course of Keflex -Initial onset 12/2019 with most recent occurrence 05/2021 in setting of medication noncompliance    Migraines:   Cervicalgia:  -no recent migraine - will use Nurtec with benefit -can worsen with stressors, loud noises, over stimulation, lack of sleep -can have upper trap tightness at times which can contribute to migraine headaches, requesting refill for baclofen which helps with spasms, uses sparingly   History:  Hx of migraine headaches without recurrence over the past 6 years but restarted 06/2020 with similar symptoms Unilateral temporal pain associated with photophobia, phonophobia and nausea with pulsating quality Prior frequent use of Tylenol, Excedrin and BC powder -denies continued frequent use Typically associated with increased stress and present mid-to-late afternoon Concern for cervicalgia as contributing factor     L IPH and IVH, 08/2019 -Residual aphasia, cognitive impairment, mild imbalance and right peripheral field vision impairment stable -Denies new or worsening stroke/TIA symptoms -Blood pressure well controlled -Routinely follows with PCP for stroke risk factor management        ROS:   14 system review of systems is positive for those listed in HPI and all other systems  negative  PMH:  Past Medical History:  Diagnosis Date   Seizures (HCC) 01/2020   sz 11/17/21   Stroke [facility]) 2020    Social History:  Social History   Socioeconomic  History   Marital status: Single    Spouse name: Not on file   Number of children: Not on file   Years of education: Not on file   Highest education level: Not on file  Occupational History   Not on file  Tobacco Use   Smoking status: Never   Smokeless tobacco: Never  Substance and Sexual Activity   Alcohol use: Not on file   Drug use: Not on file   Sexual activity: Not on file  Other Topics Concern   Not on file  Social History Narrative   Not on file   Social Determinants of Health   Financial Resource Strain: High Risk (09/09/2021)   Received [facility]   Overall Financial Resource Strain (CARDIA)    Difficulty of Paying Living Expenses: Very hard  Food Insecurity: No Food Insecurity (12/23/2021)   Received from [facility]   Hunger Vital Sign    Worried About Running Out of Food in the Last Year: Never true    Ran Out of Food in the Last Year: Never true  Transportation Needs: Unknown (09/09/2021)   Received from [facility] - Transportation    Lack of Transportation (Medical): No    Lack of Transportation (Non-Medical): Patient declined  Physical Activity: Insufficiently Active (09/09/2021)   Received from [facility]   Exercise Vital Sign    Days of Exercise per Week: 1 day    Minutes of Exercise per Session: 20 min  Stress: Stress Concern Present (09/09/2021)   Received from [facility] - Occupational Stress Questionnaire    Feeling of Stress : To some extent  Social Connections: Unknown (01/31/2022)   Received from [facility]   Social Network    Social Network: Not on file  Intimate Partner Violence: Unknown (12/22/2021)   Received from [facility]   HITS    Physically Hurt: Not on file    Insult or Talk Down To: Not on file    Threaten Physical Harm: Not on file    Scream or Curse: Not on file    Medications:   Current Outpatient Medications on File Prior to Visit  Medication Sig  Dispense Refill   ALPRAZolam (XANAX) 0.5 MG tablet Take 1 tablet (0.5 mg total) by mouth as directed. Take 1 tablet 30 mins prior to mri and may repeat x 1 if needed 2 tablet 0   clonazePAM (KLONOPIN) 0.5 MG tablet Take 0.5 mg by mouth 2 (two) times daily as needed for anxiety.     diazePAM, 20 MG Dose, (VALTOCO 20 MG DOSE) 2 x 10 MG/0.1ML LQPK Place 20 mg into the nose as needed (seizure rescue). 1 each 3   ibuprofen (ADVIL) 800 MG tablet Take 1 tablet (800 mg total) by mouth every 8 (eight) hours as needed (pain). 21 tablet 0   lacosamide (VIMPAT) 50 MG TABS tablet Take 50mg  BID for 7 days then increase to 100mg  twice daily 98 tablet 0   levETIRAcetam 1000 MG TB24 Take 2,000 mg by mouth at bedtime. 60 tablet 5   Rimegepant Sulfate (NURTEC) 75 MG TBDP Take 75 mg by mouth daily as needed (for acute migraine). Max dose 75mg /24hr 10 tablet 11   sertraline (ZOLOFT) 100 MG  tablet Take 100 mg by mouth daily.     No current facility-administered medications on file prior to visit.    Allergies:   Allergies  Allergen Reactions   Penicillins Itching   Penicillin G     Other reaction(s): Unknown      Vitals Today's Vitals   04/19/23 1244  BP: 98/61  Pulse: 62  Weight: 158 lb (71.7 kg)  Height: 5\' 9"  (1.753 m)   Body mass index is 23.33 kg/m.  Physical Exam General: well developed, well nourished, very pleasant middle-aged African-American lady, seated, in no evident distress Head: head normocephalic and atraumatic.  Neck: supple with no carotid or supraclavicular bruits Cardiovascular: regular rate and rhythm, no murmurs Musculoskeletal: no deformity.  Skin:  no rash/petichiae Vascular:  Normal pulses all extremities  Neurologic Exam Mental Status: Awake and fully alert.  Mild aphasia.  No evidence of dysarthria oriented to place and time. Recent and remote memory intact.  Attention span, concentration and fund of knowledge mildly impaired.  Mood and affect appropriate.   Cranial  Nerves: Pupils equal, briskly reactive to light. Extraocular movements full without nystagmus. Visual fields OD right periphery loss, visual field OS slight decreased visual acuity superior temporal quadrant otherwise intact. Hearing intact. Facial sensation intact. Face, tongue, palate moves normally and symmetrically.  Motor: Normal bulk and tone. Normal strength in all tested extremity muscles. Sensory.: intact to touch ,pinprick .position and vibratory sensation.  Coordination: Rapid alternating movements normal in all extremities. Finger-to-nose and heel-to-shin performed accurately bilaterally. Gait and Station: Arises from chair without difficulty. Stance is normal. Gait demonstrates normal stride length and balance . Able to heel, toe and tandem walk without difficulty.  Reflexes: 1+ and symmetric. Toes downgoing.        ASSESSMENT/PLAN:  42 year old African-American lady with left and pleural parenchymal intracerebral hemorrhage as well as intraventricular hemorrhage in 08/2019 s/p craniotomy and ventriculostomy of  unclear etiology.  Possibly vein of [place] thrombosis but it occurred nearly 3 months postpartum making association with pregnancy doubtful.  Lab work for vasculitis and hypercoagulable panel were negative.  Seizure activity 12/2019 after discontinuing Keppra as advised by neurosurgery (as initially prescribed as prophylaxis) and additional sz 05/2021 in setting of medication noncompliance. Hx of remote migraine headaches - restarted around 06/2020.  Breakthrough seizure 11/17/2021 in first trimester pregnancy, underwent elective abortion.  Recurrent breakthrough seizure 06/2022 seemingly unprovoked and 4 recurrent seizures in Jan and Feb 2024 seemingly unprovoked   Seizure, late effect of stroke -Continue Keppra XR 3000 mg nightly  -EEG 12/2022 no evidence of epileptiform activity -EEG 11/2021 no focal lateralizing or epileptiform features -MRI brain not completed - can hold off  for now as stable without any recent seizures -okay to return back to driving next month as she will be seizure-free for 6 months -Advised to call with any reoccurring seizure activity -Unable to afford lacosamide due to high co-pay   Hx of ICH -Stable aphasia, cognitive impairment and visual impairment (OD right periphery)  -Continue to follow with PCP for long-term disability -MRV 01/2020 unremarkable for underlying abnormalities -MRI brain w/wo 01/2020 stable -Discussed secondary stroke prevention measures and importance of close PCP follow-up for aggressive stroke risk factor management   Migraine Cervicalgia  -Migraines currently well-controlled -Continue Nurtec as needed for acute management -continue baclofen 10mg  twice as needed for neck spasms contributing to headaches -Discussed evaluation with PT for dry needling but declines interest at this time     Follow-up in 6 months or  call earlier if needed     CC:  Barnie Mort, PA-C    I spent 36 minutes of face-to-face and non-face-to-face time with patient and sister.  This included previsit chart review, order entry, lab review, study review, electronic health record documentation, patient and sister discussion and education regarding above diagnoses and treatment plan and answered all questions to patient and sister satisfaction  Ihor Austin, [facility]  [facility] [address] [place], [place] [address]  Phone [phone] Fax [phone] Note: This document was prepared with digital dictation and possible smart phrase technology. Any transcriptional errors that result from this process are unintentional.

## 2023-04-19 NOTE — Patient Instructions (Signed)
Continue Keppra XR 3000mg  nightly for seizure prevention  Continue Nurtec as needed for migraine rescue   Use of baclofen as needed  Signs of a Stroke? Follow the BEFAST method:  Balance Watch for a sudden loss of balance, trouble with coordination or vertigo Eyes Is there a sudden loss of vision in one or both eyes? Or double vision?  Face: Ask the person to smile. Does one side of the face droop or is it numb?  Arms: Ask the person to raise both arms. Does one arm drift downward? Is there weakness or numbness of a leg? Speech: Ask the person to repeat a simple phrase. Does the speech sound slurred/strange? Is the person confused ? Time: If you observe any of these signs, call 911.     Followup in the future with me in 6 months or call earlier if needed       Thank you for coming to see [place] at [facility]. I hope we have been able to provide you high quality care today.  You may receive a patient satisfaction survey over the next few weeks. We would appreciate your feedback and comments so that we may continue to improve ourselves and the health of our patients.

## 2023-05-17 ENCOUNTER — Telehealth: Payer: Self-pay | Admitting: Adult Health

## 2023-05-17 DIAGNOSIS — G40919 Epilepsy, unspecified, intractable, without status epilepticus: Secondary | ICD-10-CM

## 2023-05-17 DIAGNOSIS — R569 Unspecified convulsions: Secondary | ICD-10-CM

## 2023-05-17 DIAGNOSIS — I611 Nontraumatic intracerebral hemorrhage in hemisphere, cortical: Secondary | ICD-10-CM

## 2023-05-17 NOTE — Telephone Encounter (Signed)
Pt request refill for levETIRAcetam ER 1000 MG TB24 sent to  [facility] #[id number]

## 2023-05-18 MED ORDER — LEVETIRACETAM ER 750 MG PO TB24: 3000.0000 mg | ORAL_TABLET | Freq: Every evening | ORAL | 11 refills | Status: DC

## 2023-05-18 MED ORDER — LEVETIRACETAM ER 1000 MG PO TB24: 3000.0000 mg | ORAL_TABLET | Freq: Every day | ORAL | 5 refills | Status: DC

## 2023-05-18 NOTE — Telephone Encounter (Signed)
Pt called in and spoke with the phone staff states she was down to 2 pills. When asked how has the patient been taking the medication she states that she has been taking 500 mg (3 tablets-1500mg ) at bedtime. She never increased the dose.  Spoke with Shanda Bumps and advised her of this. She has already sent the script in for 750 mg ER and advised pt to start with 3 tablets at bedtime for a week and then increase to 4 tablet at bedtime. Instructed the pt to contact us if she has issues.

## 2023-05-18 NOTE — Telephone Encounter (Signed)
Refill sent to the pharmacy for the pt ?

## 2023-05-18 NOTE — Telephone Encounter (Signed)
Will update prescription for Keppra ER 750mg  tablets - will take 4 at bedtime to total 3000mg  nightly dosage.

## 2023-05-18 NOTE — Telephone Encounter (Signed)
At 12:19 Pharmacist from [facility] left vm statubg Rx was received for Keppra ER 1000mg , pharmacist said it does not come in ER just plain Keppra, she is asking that a revised Rx be sent over, she can be called at [phone]

## 2023-05-18 NOTE — Telephone Encounter (Signed)
Pt said, call the pharmacy and they said need to call the neurologist for a refill

## 2023-05-18 NOTE — Addendum Note (Signed)
Addended by: Ihor Austin L on: 05/18/2023 01:47 PM   Modules accepted: Orders

## 2023-06-21 ENCOUNTER — Telehealth: Payer: Self-pay | Admitting: Adult Health

## 2023-06-21 NOTE — Telephone Encounter (Signed)
Called the patient back. There was no answer. Left a detailed message advising that I got her message that was left. Informed her that looked like about a month ago we talked and she should be taking 4 tablets of keppra 750 mg ER daily. Wanted to confirm she was up to that dose and otherwise tolerating well.  Advised she could call back. I don't know that we would necessarily make a medication change at this time but I would let Shanda Bumps, NP know what happened and see what she says also.

## 2023-06-21 NOTE — Telephone Encounter (Signed)
Yes, please see what current dosage of Keppra she is taking and ensuring no missed dosages, if not yet taking 4 750mg  keppra tablets, would encourage her to do so. If she is taking that dosage already, may need to give some more time for full dosage to take effect but also see if any recent illness, any extreme stress, sleep deprivation or drastic change in diet recently that could have contributed. Please call with any recurrent events.

## 2023-06-21 NOTE — Telephone Encounter (Signed)
Pt calling to report a baby seizure on 9-27, has felt ok since other than neck hurting, pt mentioned her stroke from 4 years ago.  Pt did not go to ED over this recent baby seizure, she would like a call to discuss.

## 2023-07-13 ENCOUNTER — Other Ambulatory Visit: Payer: Self-pay | Admitting: Anesthesiology

## 2023-07-13 MED ORDER — NURTEC 75 MG PO TBDP: 75.0000 mg | ORAL_TABLET | Freq: Every day | ORAL | 2 refills | Status: DC | PRN

## 2023-08-30 ENCOUNTER — Telehealth: Payer: Self-pay | Admitting: Adult Health

## 2023-08-30 MED ORDER — LAMOTRIGINE 25 MG PO TABS: ORAL_TABLET | ORAL | 0 refills | Status: DC

## 2023-08-30 MED ORDER — LAMOTRIGINE 100 MG PO TBDP: 100.0000 mg | ORAL_TABLET | Freq: Two times a day (BID) | ORAL | 5 refills | Status: DC

## 2023-08-30 NOTE — Telephone Encounter (Signed)
Called and spoke to Patient and educated on the [facility] and spoke about new medication and she is willing to try again. I advised I would route back to the provider so the correct titration can be sent.

## 2023-08-30 NOTE — Telephone Encounter (Signed)
Order placed for lamotrigine 25mg  tablets with gradual titration to 100mg  BID. Additional order placed for 100mg  tablets which can be picked up towards the end of the titration incase unable to tolerate 100mg  dosing. Please advise patient. Thank you.

## 2023-08-30 NOTE — Telephone Encounter (Addendum)
FYI-pt called wanting to inform provider that last night 08/29/23 around 6pm she had a [employer] Seizure. Pt had EMS come out but pt was not taken to the ER.

## 2023-08-30 NOTE — Telephone Encounter (Signed)
Due to recurrent breakthrough seizure despite high-dose Keppra, would recommend adding on lamotrigine. If patient agrees, please let me know. Previously tried to add lacosamide but did not continue due to high copay. If patient agreeable to starting lamotrigine, can place order with gradual titration as noted below. Please advise her that family members can be educated on administering rescue med if she is unable to give to herself prior to seizure.   Start Lamictal with gradual titration as below  Lamotrigine Morning Evening  Week 1 25mg     Week 2 25mg  25mg   Week 3 50mg  25mg   Week 4 50mg  50mg   Week 5 75mg  50mg   Week 6 75mg  75mg   Week 7 100mg  75mg   Week 8 100mg  100mg 

## 2023-08-30 NOTE — Addendum Note (Signed)
Addended by: Ihor Austin L on: 08/30/2023 04:24 PM   Modules accepted: Orders

## 2023-08-30 NOTE — Telephone Encounter (Signed)
Call to patient who reports getting her son ready for bed about 6pm when she felt off for about 3-5 minutes, tried to make it downstairs to her rescue medication and didn't make it to it before she had a reported grand mal seizure, she reports her sister was there and it last about 8 minutes. NO rescue med was given and she did not go with EMS to the ER. Reports she takes her meds between 6-7 and she had not had her keppra (4 tabs) yet last night, she did not miss any of the previous doses and reports she took the medication after the seizure and before going to sleep at 7pm and sleeping until this morning following the seizures. She denies any recent drastic changes in diet or changes in meds. She des report traveling on two trips in November that were long she says but still did not miss any medication doses. No changes in sleep habits either were reported, She states she feels better this morning, just tired. I advised I would send to the provider for recommendations, she reports that she would be okay with a [facility] message of recommendations for follow up. Will follow up after provider reviews

## 2023-08-31 NOTE — Telephone Encounter (Signed)
Called and spoke to patient, agreeable to waiting until she gets on a higher dose of medication before picking up 100mg . She does not know copay yet because she has not yet picked up medication. States she will call if needed, no further questions needed at this time.

## 2023-10-13 ENCOUNTER — Other Ambulatory Visit: Payer: Self-pay | Admitting: Adult Health

## 2023-10-13 ENCOUNTER — Other Ambulatory Visit: Payer: Self-pay | Admitting: Neurology

## 2023-10-13 DIAGNOSIS — G40919 Epilepsy, unspecified, intractable, without status epilepticus: Secondary | ICD-10-CM

## 2023-10-13 DIAGNOSIS — I69398 Other sequelae of cerebral infarction: Secondary | ICD-10-CM

## 2023-10-13 DIAGNOSIS — I611 Nontraumatic intracerebral hemorrhage in hemisphere, cortical: Secondary | ICD-10-CM

## 2023-10-13 MED ORDER — UBRELVY 100 MG PO TABS: 100.0000 mg | ORAL_TABLET | ORAL | 5 refills | Status: AC | PRN

## 2023-10-13 MED ORDER — DIAZEPAM (15 MG DOSE) 2 X 7.5 MG/0.1ML NA LQPK: 15.0000 mg | NASAL | 0 refills | Status: DC | PRN

## 2023-10-13 NOTE — Telephone Encounter (Signed)
Called the patient back and she states that she has been compliant with taking the medication. At first she told me that she was at goal on lamotrigine but then after further discussion turned out she was only taking 2 tab in the am and 2 tab at bedtime. (50 mg BID) Advised the patient that she needs to continue titrating up to get to the goal of 100 mg BID. I went over those instructions on how she needs to continue to titrate up and advised I would send a mychart message with those instructions as well.  She complains of headaches and the nurtec is not helping. The Bernita Raisin did work for her but insurance denied her coverage before. Advised we can try getting that approved now that she has tried nurtec. Advised that she should work on getting to goal with the lamotrigine and hopefully getting to the full dose will help with her seizures as well as headaches.  I will also send a refill in for the valtoco nasal spray for the patient

## 2023-10-13 NOTE — Addendum Note (Signed)
Addended by: Ihor Austin L on: 10/13/2023 03:09 PM   Modules accepted: Orders

## 2023-10-13 NOTE — Telephone Encounter (Signed)
Based on prior visits weight at 71.7 kg, will prescribe nasal diazepam 15 mg (2 7.5mg ) one spray in each nostril. Will correct order. Thank you.

## 2023-10-13 NOTE — Addendum Note (Signed)
Addended by: Judi Cong on: 10/13/2023 02:08 PM   Modules accepted: Orders

## 2023-10-13 NOTE — Telephone Encounter (Signed)
Pt states she has had 2 seizures since the new year. States she can sense when a seizure is about to come and is out of the medication to help stop it. Requesting call back

## 2023-11-01 ENCOUNTER — Telehealth: Payer: Self-pay | Admitting: Adult Health

## 2023-11-01 NOTE — Telephone Encounter (Signed)
 Pt called and LVM saying she is having trouble getting her seizure medication from the pharmacy, would like a call back from the nurse. She states she only has tonight's dose and then will be out.

## 2023-11-01 NOTE — Telephone Encounter (Addendum)
 I called patient to relay she should have Rx at the pharmacy already for a fill date of 11/01/23. Pt voicemail is full and not able to leave a message.

## 2023-11-01 NOTE — Telephone Encounter (Signed)
 Patient left a voice mail that is needs this prescription refilled ASAP please. She said she has enough for today and tomorrow.

## 2023-11-01 NOTE — Telephone Encounter (Signed)
 I called patient, but was not able to leave a message voicemail is full.

## 2023-11-02 NOTE — Telephone Encounter (Signed)
Pt called back and said she was told [facility] doesn't have Rx. I called [facility] and the lamotrigine 100 mg tablets was out of stock, but they ordered yesterday and medication will be in today.  It also needed PA which was done and completed per [facility].   I called pt and informed her of this as well.  Pt aware to wait until she hears from [facility] before going to pharmacy.

## 2023-12-06 ENCOUNTER — Telehealth: Payer: Self-pay | Admitting: Adult Health

## 2023-12-06 MED ORDER — LAMOTRIGINE 100 MG PO TABS: 100.0000 mg | ORAL_TABLET | Freq: Two times a day (BID) | ORAL | 5 refills | Status: DC

## 2023-12-06 NOTE — Telephone Encounter (Signed)
 Pt asking, Can you prescribe  lamoTRIgine 100 MG TBDP that I do not have to pay for.  Currently get the kind that melt in my mouth and pay $26. Would like a call back.

## 2023-12-06 NOTE — Telephone Encounter (Signed)
 I will resend the updated script for the pt to the pharmacy for her

## 2024-02-10 ENCOUNTER — Telehealth: Payer: Self-pay | Admitting: Adult Health

## 2024-02-10 NOTE — Telephone Encounter (Signed)
 Pt called stating that the pharmacy keeps messing up and trying to fill a seizure medication for the pt that is $1000 everytime she takes it. Pt is needing a refill on the lamoTRIgine  (LAMICTAL ) 100 MG tablet and is needing it as soon as possible due to her already being out for several days. Pt would like the RN to call her after the medication is called in to discuss how this situation can be fixed so that she does not have to go through this every month.

## 2024-02-10 NOTE — Telephone Encounter (Signed)
 Spoke w/Pt regarding refill. Pt stated the pharmacy is attempting to fill a script for lamotrigine  100 mg dissolving tablets that are more expensive. Confirmed w/Pt her script is for regular tablets you swallow and the script written 12/06/23 had 5 refills. Pt stated she will call the pharmacy and ask them to reach out to our office for clarification of medication.

## 2024-04-26 ENCOUNTER — Other Ambulatory Visit: Payer: Self-pay | Admitting: Nurse Practitioner

## 2024-04-26 DIAGNOSIS — N6321 Unspecified lump in the left breast, upper outer quadrant: Secondary | ICD-10-CM

## 2024-05-09 ENCOUNTER — Encounter

## 2024-05-09 ENCOUNTER — Other Ambulatory Visit

## 2024-05-16 ENCOUNTER — Ambulatory Visit
Admission: RE | Admit: 2024-05-16 | Discharge: 2024-05-16 | Disposition: A | Discharge status: Home / Self Care | Source: Ambulatory Visit | Attending: Nurse Practitioner | Admitting: Nurse Practitioner

## 2024-05-16 ENCOUNTER — Other Ambulatory Visit: Payer: Self-pay | Admitting: Nurse Practitioner

## 2024-05-16 DIAGNOSIS — N6320 Unspecified lump in the left breast, unspecified quadrant: Secondary | ICD-10-CM

## 2024-05-16 DIAGNOSIS — N6321 Unspecified lump in the left breast, upper outer quadrant: Secondary | ICD-10-CM

## 2024-05-23 ENCOUNTER — Ambulatory Visit
Admission: RE | Admit: 2024-05-23 | Discharge: 2024-05-23 | Disposition: A | Discharge status: Home / Self Care | Source: Ambulatory Visit | Attending: Nurse Practitioner | Admitting: Nurse Practitioner

## 2024-05-23 DIAGNOSIS — N6320 Unspecified lump in the left breast, unspecified quadrant: Secondary | ICD-10-CM

## 2024-05-23 HISTORY — PX: BREAST BIOPSY: SHX20

## 2024-05-24 ENCOUNTER — Other Ambulatory Visit: Payer: Self-pay

## 2024-05-24 LAB — SURGICAL PATHOLOGY

## 2024-05-24 MED ORDER — LEVETIRACETAM ER 750 MG PO TB24: 3000.0000 mg | ORAL_TABLET | Freq: Every evening | ORAL | 1 refills | Status: DC

## 2024-06-21 ENCOUNTER — Telehealth: Payer: Self-pay | Admitting: Adult Health

## 2024-06-21 NOTE — Telephone Encounter (Signed)
 Pt called stating that she is out of medication and was not aware that she didn't have a follow up appt scheduled  . Pt  was last seen  12/31/2022  . Scheduled Pt follow up appt . Pt is requesting medication to be refill   diazePAM , 15 MG Dose, 2 x 7.5 MG/0.1ML LQPK   lamoTRIgine  (LAMICTAL ) 100 MG tablet levETIRAcetam  (KEPPRA  XR) 750 MG 24 hr tablet   Pt would like medication to be sent to   [facility] #[id number] [address], [place] - [address] (Ph: [phone])

## 2024-06-22 MED ORDER — LAMOTRIGINE 100 MG PO TABS: 100.0000 mg | ORAL_TABLET | Freq: Two times a day (BID) | ORAL | 1 refills | Status: DC

## 2024-06-22 MED ORDER — DIAZEPAM (15 MG DOSE) 2 X 7.5 MG/0.1ML NA LQPK: 15.0000 mg | NASAL | 5 refills | Status: AC | PRN

## 2024-06-22 NOTE — Telephone Encounter (Addendum)
 Pt called again wanting to know when these will be called in for her since she has already been out for 3 days. Pt has scheduled a f/u appt. Please advise.

## 2024-06-30 ENCOUNTER — Telehealth: Payer: Self-pay

## 2024-06-30 ENCOUNTER — Other Ambulatory Visit (HOSPITAL_COMMUNITY): Payer: Self-pay

## 2024-06-30 NOTE — Telephone Encounter (Signed)
 Pharmacy Patient Advocate Encounter   Received notification from Fax that prior authorization for Valtocois required/requested.   Insurance verification completed.   The patient is insured through [employer].   Per test claim: The current 30 day co-pay is, $0.  No PA needed at this time. This test claim was processed through [facility]- copay amounts may vary at other pharmacies due to pharmacy/plan contracts, or as the patient moves through the different stages of their insurance plan.

## 2024-07-04 ENCOUNTER — Telehealth: Payer: Self-pay | Admitting: *Deleted

## 2024-07-04 MED ORDER — LEVETIRACETAM ER 750 MG PO TB24: 3000.0000 mg | ORAL_TABLET | Freq: Every evening | ORAL | 0 refills | Status: DC

## 2024-07-10 NOTE — Telephone Encounter (Signed)
 Pt called to Follow up about Medication refill.Pt states  she was told that her medication will not be filled until 10-24 ,Pt would like to speak to Nurse about  will not having this medication trigger her to have a  seizure , Because Pt state she is out of medication and don't want to be off track .    levETIRAcetam  (KEPPRA  XR) 750 MG 24 hr tablet   [id number] - [place], [place] - [address] (Ph: [phone])

## 2024-07-11 NOTE — Telephone Encounter (Signed)
 Spoke to to patient states hasn't had Keppra  in 5 days ,. Did send refill to pharmacy on 07/04/2024 . Called Pharmacy made them aware pt states has been out of medication for 5 days Pharmacy states Pt picked up last refill 06/22/2024 Pharmacy states will call patient to discuss

## 2024-07-11 NOTE — Telephone Encounter (Signed)
 Pt called following up about medication refill Pt is scared she might have seizure ,

## 2024-07-11 NOTE — Telephone Encounter (Signed)
 Noted. We can consider changing pharmacies in the future if needed.

## 2024-07-11 NOTE — Telephone Encounter (Signed)
 Can we also ensure she is receiving the correct dosage from pharmacy- should be receiving Keppra  XR 750 mg tablets taking 4 at night for total of 3000mg  daily dose. Please also ensure patient is taking medication correctly.  Thank you.

## 2024-07-24 NOTE — Progress Notes (Unsigned)
 [facility] [address] [place]. [place] [address] [phone]       OFFICE FOLLOW-UP NOTE  [name] Gentle Date of Birth:  [birth year] Medical Record Number:  [id number]    Chief complaint: No chief complaint on file.     BRIEF HISTORY:  Amanda Small is a 43 y.o. female with PMHx of ICH with IVH s/p craniotomy and ventriculostomy of unclear source on 08/2019 with residual aphasia, cognitive impairment and visual impairment as well as late effect seizures.  Continues to follow in office for such.  She was previously seen 12/17/2022    INTERVAL HISTORY:    Seizure, late effect of stroke  -last seizure 08/2023 -started on lamotrigine  with gradual titration to 100 mg twice daily -Remains on lamotrigine  100 mg twice daily and Keppra  XR 3000 mg nightly  -At prior visit, was seen after ED visit 09/2022 for breakthrough seizure with Keppra  dosage increase to 2000 mg nightly.  Reported 3 additional events since that time, lacosamide  initiated at prior visit.  Also completed EEG which showed focal left hemispheric slowing likely due to prior craniotomy but no definite epileptiform activity.  Recommended completion of MRI brain but not yet completed.  Prescribed Valtoco  as needed for breakthrough seizures. -denies any additional seizure activity since 10/2022 -Currently on Keppra  XR 3000mg  nightly -on Vimpat  for 1 month, unable to continue due to high co-pay  -did not obtain Valtoco  for seizure rescue    Hx: -Recurrent seizures between 2-11/2022 despite increased dose of Keppra  -lacosamide  initiated -Breakthrough seizure 09/2022 -Keppra  dosage increased to 2000 mg nightly -Previously increased Keppra  dosage 06/2022 to 1500mg  nightly for recurrent seizure -Previously increase Keppra  XR dosage after 10/2021 seizure to 1500 mg nightly but after elective abortion, she went back down to 1000mg  nightly, left at that dose as doing well -breakthrough seizure  11/17/2021 - ED eval @ [facility] ED. Per ED note review, about to take nightly Keppra  and felt prodromal symptoms typically felt prior to seizure. Stated unwitnessed seizure for unknown length of time. She also reported recently finding out she was pregnant, unplanned, transvaginal ultrasound reported [redacted] weeks pregnant.  Reported compliance with Keppra  XR 1000mg  nightly. CBC and CMP unremarkable. CTH negative for new findings.  Urinalysis moderate leukocyte Estrace and few bacteria, tx'd with 7 day course of Keflex -Initial onset 12/2019 with most recent occurrence 05/2021 in setting of medication noncompliance    Migraines:   Cervicalgia:  -no recent migraine - will use Nurtec with benefit -can worsen with stressors, loud noises, over stimulation, lack of sleep -can have upper trap tightness at times which can contribute to migraine headaches, requesting refill for baclofen  which helps with spasms, uses sparingly   History:  Hx of migraine headaches without recurrence over the past 6 years but restarted 06/2020 with similar symptoms Unilateral temporal pain associated with photophobia, phonophobia and nausea with pulsating quality Prior frequent use of Tylenol , Excedrin and BC powder -denies continued frequent use Typically associated with increased stress and present mid-to-late afternoon Concern for cervicalgia as contributing factor     L IPH and IVH, 08/2019 -Residual aphasia, cognitive impairment, mild imbalance and right peripheral field vision impairment stable -Denies new or worsening stroke/TIA symptoms -Blood pressure well controlled -Routinely follows with PCP for stroke risk factor management        ROS:   14 system review of systems is positive for those listed in HPI and all other systems negative  PMH:  Past  Medical History:  Diagnosis Date   Seizures (HCC) 01/2020   sz 11/17/21   Stroke [facility]) 2020    Social History:  Social History    Socioeconomic History   Marital status: Single    Spouse name: Not on file   Number of children: Not on file   Years of education: Not on file   Highest education level: Not on file  Occupational History   Not on file  Tobacco Use   Smoking status: Never   Smokeless tobacco: Never  Substance and Sexual Activity   Alcohol use: Not on file   Drug use: Not on file   Sexual activity: Not on file  Other Topics Concern   Not on file  Social History Narrative   Not on file   Social Drivers of Health   Financial Resource Strain: High Risk (09/09/2021)   Received from [facility]   Overall Financial Resource Strain (CARDIA)    Difficulty of Paying Living Expenses: Very hard  Food Insecurity: No Food Insecurity (12/23/2021)   Received from [facility]   Hunger Vital Sign    Within the past 12 months, you worried that your food would run out before you got the money to buy more.: Never true    Within the past 12 months, the food you bought just didn't last and you didn't have money to get more.: Never true  Transportation Needs: Unknown (09/09/2021)   Received from [facility] - Transportation    Lack of Transportation (Medical): No    Lack of Transportation (Non-Medical): Patient declined  Physical Activity: Insufficiently Active (09/09/2021)   Received from [facility]   Exercise Vital Sign    On average, how many days per week do you engage in moderate to strenuous exercise (like a brisk walk)?: 1 day    On average, how many minutes do you engage in exercise at this level?: 20 min  Stress: Stress Concern Present (09/09/2021)   Received from [facility] - Occupational Stress Questionnaire    Feeling of Stress : To some extent  Social Connections: Unknown (01/31/2022)   Received from [facility]   Social Network    Social Network: Not on file  Intimate Partner Violence: Unknown (12/22/2021)   Received from [facility]   HITS    Physically Hurt: Not on file    Insult or Talk Down To: Not on file    Threaten Physical Harm: Not on file    Scream or Curse: Not on file    Medications:   Current Outpatient Medications on File Prior to Visit  Medication Sig Dispense Refill   baclofen  (LIORESAL ) 10 MG tablet Take 1 tablet (10 mg total) by mouth 2 (two) times daily as needed for muscle spasms. 30 each 11   clonazePAM (KLONOPIN) 0.5 MG tablet Take 0.5 mg by mouth 2 (two) times daily as needed for anxiety.     diazePAM , 15 MG Dose, 2 x 7.5 MG/0.1ML LQPK Place 15 mg into the nose as needed (Seizure). Spray 1 device in each nostril for seizure rescue 10 each 5   ibuprofen  (ADVIL ) 800 MG tablet Take 1 tablet (800 mg total) by mouth every 8 (eight) hours as needed (pain). 21 tablet 0   lamoTRIgine  (LAMICTAL ) 100 MG tablet Take 1 tablet (100 mg total) by mouth 2 (two) times daily. 60 tablet 1   levETIRAcetam  (KEPPRA  XR) 750 MG 24 hr tablet Take 4  tablets (3,000 mg total) by mouth at bedtime. 120 tablet 0   Rimegepant Sulfate (NURTEC) 75 MG TBDP Take 1 tablet (75 mg total) by mouth daily as needed (for acute migraine). Max dose 75mg /24hr 10 tablet 2   sertraline (ZOLOFT) 100 MG tablet Take 100 mg by mouth daily.     Ubrogepant  (UBRELVY ) 100 MG TABS Take 1 tablet (100 mg total) by mouth as needed (take 1 tablet at onset of migraine. may repeat another tablet). 10 tablet 5   No current facility-administered medications on file prior to visit.    Allergies:   Allergies  Allergen Reactions   Penicillins Itching   Penicillin G     Other reaction(s): Unknown      Vitals There were no vitals filed for this visit.  There is no height or weight on file to calculate BMI.  Physical Exam General: well developed, well nourished, very pleasant middle-aged African-American lady, seated, in no evident distress Head: head normocephalic and atraumatic.  Neck: supple with no carotid or supraclavicular  bruits Cardiovascular: regular rate and rhythm, no murmurs Musculoskeletal: no deformity.  Skin:  no rash/petichiae Vascular:  Normal pulses all extremities  Neurologic Exam Mental Status: Awake and fully alert.  Mild aphasia.  No evidence of dysarthria oriented to place and time. Recent and remote memory intact.  Attention span, concentration and fund of knowledge mildly impaired.  Mood and affect appropriate.   Cranial Nerves: Pupils equal, briskly reactive to light. Extraocular movements full without nystagmus. Visual fields OD right periphery loss, visual field OS slight decreased visual acuity superior temporal quadrant otherwise intact. Hearing intact. Facial sensation intact. Face, tongue, palate moves normally and symmetrically.  Motor: Normal bulk and tone. Normal strength in all tested extremity muscles. Sensory.: intact to touch ,pinprick .position and vibratory sensation.  Coordination: Rapid alternating movements normal in all extremities. Finger-to-nose and heel-to-shin performed accurately bilaterally. Gait and Station: Arises from chair without difficulty. Stance is normal. Gait demonstrates normal stride length and balance . Able to heel, toe and tandem walk without difficulty.  Reflexes: 1+ and symmetric. Toes downgoing.        ASSESSMENT/PLAN:  43 year old African-American lady with left pleural parenchymal intracerebral hemorrhage as well as intraventricular hemorrhage in 08/2019 s/p craniotomy and ventriculostomy of  unclear etiology.  Possibly vein of [place] thrombosis but it occurred nearly 3 months postpartum making association with pregnancy doubtful.  Lab work for vasculitis and hypercoagulable panel were negative.  Seizure activity 12/2019 after discontinuing Keppra  as advised by neurosurgery (as initially prescribed as prophylaxis) and additional sz 05/2021 in setting of medication noncompliance. Hx of remote migraine headaches - restarted around 06/2020.  Breakthrough  seizure 11/17/2021 in first trimester pregnancy, underwent elective abortion.  Recurrent breakthrough seizure 06/2022 seemingly unprovoked and 4 recurrent seizures in Jan and Feb 2024 seemingly unprovoked, lacosamide  added in addition to Keppra  but discontinued after 1 month due to cost. Recurrent seizure 08/2023, lamotrigine  added.    Seizure, late effect of stroke -Continue Keppra  XR 3000 mg nightly  -Continue lamotrigine  100 mg twice daily -EEG 12/2022 no evidence of epileptiform activity -EEG 11/2021 no focal lateralizing or epileptiform features -MRI brain not completed - can hold off for now as stable without any recent seizures -okay to return back to driving next month as she will be seizure-free for 6 months -Advised to call with any reoccurring seizure activity -Unable to afford lacosamide  due to high co-pay   Hx of ICH -Stable aphasia, cognitive impairment and visual impairment (  OD right periphery)  -Continue to follow with PCP for long-term disability -MRV 01/2020 unremarkable for underlying abnormalities -MRI brain w/wo 01/2020 stable -Discussed secondary stroke prevention measures and importance of close PCP follow-up for aggressive stroke risk factor management   Migraine Cervicalgia  -Migraines currently well-controlled -Continue Nurtec as needed for acute management -continue baclofen  10mg  twice as needed for neck spasms contributing to headaches -Discussed evaluation with PT for dry needling but declines interest at this time     Follow-up in 6 months or call earlier if needed     CC:  Leonce Carola PARAS, PA-C      Harlene Bogaert, AGNP-BC  [facility] [address] [place], [place] [address]  Phone [phone] Fax [phone] Note: This document was prepared with digital dictation and possible smart phrase technology. Any transcriptional errors that result from this process are unintentional.

## 2024-07-25 ENCOUNTER — Encounter: Payer: Self-pay | Admitting: Adult Health

## 2024-07-25 ENCOUNTER — Other Ambulatory Visit (HOSPITAL_COMMUNITY): Payer: Self-pay

## 2024-07-25 ENCOUNTER — Ambulatory Visit (INDEPENDENT_AMBULATORY_CARE_PROVIDER_SITE_OTHER): Admitting: Adult Health

## 2024-07-25 VITALS — BP 112/74 | HR 78 | Ht 69.0 in | Wt 167.0 lb

## 2024-07-25 DIAGNOSIS — Z5181 Encounter for therapeutic drug level monitoring: Secondary | ICD-10-CM

## 2024-07-25 DIAGNOSIS — R569 Unspecified convulsions: Secondary | ICD-10-CM

## 2024-07-25 DIAGNOSIS — I611 Nontraumatic intracerebral hemorrhage in hemisphere, cortical: Secondary | ICD-10-CM

## 2024-07-25 DIAGNOSIS — G43009 Migraine without aura, not intractable, without status migrainosus: Secondary | ICD-10-CM | POA: Diagnosis not present

## 2024-07-25 DIAGNOSIS — M5412 Radiculopathy, cervical region: Secondary | ICD-10-CM

## 2024-07-25 DIAGNOSIS — I69398 Other sequelae of cerebral infarction: Secondary | ICD-10-CM

## 2024-07-25 DIAGNOSIS — M542 Cervicalgia: Secondary | ICD-10-CM

## 2024-07-25 DIAGNOSIS — R270 Ataxia, unspecified: Secondary | ICD-10-CM

## 2024-07-25 MED ORDER — LEVETIRACETAM ER 750 MG PO TB24
3000.0000 mg | ORAL_TABLET | Freq: Every evening | ORAL | 3 refills | Status: DC
  Filled 2024-07-25: qty 360, 90d supply, fill #0

## 2024-07-25 MED ORDER — LAMOTRIGINE 100 MG PO TABS
100.0000 mg | ORAL_TABLET | Freq: Two times a day (BID) | ORAL | 3 refills | Status: DC
  Filled 2024-07-25: qty 180, 90d supply, fill #0

## 2024-07-25 NOTE — Patient Instructions (Addendum)
 Your Plan:  We will check lab work today and you will also be called to schedule an MR brain and MR c-spine for further evaluation.   Continue lamotrigine  100 mg twice daily  Continue Keppra  XR 3000 mg nightly (4x 750mg  tablets)  Continue use of Valtoco  as needed  Please call office with any recurrent seizure activity  Continue Nurtec as needed for migraine headaches  Continue close follow-up with PCP for stroke risk factor management     Follow up will be determined after completion of above testing      Thank you for coming to see us  at [facility]. I hope we have been able to provide you high quality care today.  You may receive a patient satisfaction survey over the next few weeks. We would appreciate your feedback and comments so that we may continue to improve ourselves and the health of our patients.

## 2024-07-25 NOTE — Addendum Note (Signed)
 Addended by: WHITFIELD RAISIN L on: 07/25/2024 10:09 AM   Modules accepted: Orders

## 2024-07-26 ENCOUNTER — Telehealth: Payer: Self-pay | Admitting: Adult Health

## 2024-07-26 NOTE — Telephone Encounter (Signed)
MRI orders sent to [facility] [phone]

## 2024-07-29 LAB — COMPREHENSIVE METABOLIC PANEL WITH GFR
ALT: 15 IU/L (ref 0–32)
AST: 21 IU/L (ref 0–40)
Albumin: 4.7 g/dL (ref 3.9–4.9)
Alkaline Phosphatase: 58 IU/L (ref 41–116)
BUN/Creatinine Ratio: 7 — ABNORMAL LOW (ref 9–23)
BUN: 5 mg/dL — ABNORMAL LOW (ref 6–24)
Bilirubin Total: 0.6 mg/dL (ref 0.0–1.2)
CO2: 25 mmol/L (ref 20–29)
Calcium: 9.8 mg/dL (ref 8.7–10.2)
Chloride: 102 mmol/L (ref 96–106)
Creatinine, Ser: 0.73 mg/dL (ref 0.57–1.00)
Globulin, Total: 2.7 g/dL (ref 1.5–4.5)
Glucose: 81 mg/dL (ref 70–99)
Potassium: 4.5 mmol/L (ref 3.5–5.2)
Sodium: 139 mmol/L (ref 134–144)
Total Protein: 7.4 g/dL (ref 6.0–8.5)
eGFR: 104 mL/min/1.73 (ref >59)

## 2024-07-29 LAB — LEVETIRACETAM LEVEL: Levetiracetam Lvl: 7.7 ug/mL — ABNORMAL LOW (ref 10.0–40.0)

## 2024-07-29 LAB — LAMOTRIGINE LEVEL: Lamotrigine Lvl: 3.5 ug/mL (ref 2.0–20.0)

## 2024-07-31 ENCOUNTER — Ambulatory Visit: Payer: Self-pay | Admitting: Adult Health

## 2024-08-07 ENCOUNTER — Ambulatory Visit: Admitting: Adult Health

## 2024-08-19 ENCOUNTER — Ambulatory Visit
Admission: RE | Admit: 2024-08-19 | Discharge: 2024-08-19 | Disposition: A | Source: Ambulatory Visit | Attending: Adult Health

## 2024-08-19 ENCOUNTER — Ambulatory Visit
Admission: RE | Admit: 2024-08-19 | Discharge: 2024-08-19 | Disposition: A | Discharge status: Home / Self Care | Source: Ambulatory Visit | Attending: Adult Health | Admitting: Adult Health

## 2024-08-19 DIAGNOSIS — M542 Cervicalgia: Secondary | ICD-10-CM | POA: Diagnosis not present

## 2024-08-19 DIAGNOSIS — I611 Nontraumatic intracerebral hemorrhage in hemisphere, cortical: Secondary | ICD-10-CM

## 2024-08-19 DIAGNOSIS — R270 Ataxia, unspecified: Secondary | ICD-10-CM

## 2024-08-19 DIAGNOSIS — M5412 Radiculopathy, cervical region: Secondary | ICD-10-CM

## 2024-09-01 ENCOUNTER — Telehealth: Payer: Self-pay | Admitting: Adult Health

## 2024-09-01 MED ORDER — LAMOTRIGINE 100 MG PO TABS: 100.0000 mg | ORAL_TABLET | Freq: Two times a day (BID) | ORAL | 3 refills | Status: AC

## 2024-09-01 MED ORDER — LEVETIRACETAM ER 750 MG PO TB24: 3000.0000 mg | ORAL_TABLET | Freq: Every evening | ORAL | 3 refills | Status: AC

## 2024-09-01 NOTE — Telephone Encounter (Signed)
 Pt called stating that she is in need of refill of her medication lamoTRIgine  (LAMICTAL ) 100 MG tablet  levETIRAcetam  (KEPPRA  XR) 750 MG 24 hr tablet  Pt medication stated 3 refill however when Pt called Pharmacy  they stated  They never receive order to fill Pt medication    [place] - [facility] (Ph: [phone])

## 2024-09-04 ENCOUNTER — Other Ambulatory Visit: Payer: Self-pay

## 2024-09-04 MED ORDER — NURTEC 75 MG PO TBDP: 75.0000 mg | ORAL_TABLET | Freq: Every day | ORAL | 2 refills | Status: AC | PRN

## 2024-09-04 NOTE — Telephone Encounter (Signed)
 Last refilled by patient on : 09/13/23 Last office visit : 07/25/24 Next office visit : Follow up will be determined after completion of above testing  Per last office visit - Continue Nurtec as needed for migraine headaches

## 2024-09-06 ENCOUNTER — Telehealth: Payer: Self-pay

## 2024-09-06 NOTE — Telephone Encounter (Signed)
 Patient last seen on 07/25/2024, per office note patient was unable to receive Lacosamide  due to high insurance cost. Patient was to be scheduled for follow up after labs and MRI results came back, if needed.    Spoke with patient to confirm that patient wasn't taking medication.   Spoke with pharmacy in regards to patient not taking medication. Pharmacy stated that they would remove it from list.
